# Patient Record
Sex: Female | Born: 1990 | Race: Black or African American | Hispanic: No | Marital: Married | Smoking: Never smoker
Health system: Southern US, Community
[De-identification: ages and names within clinical notes are randomized; demographics above are authoritative.]

## PROBLEM LIST (undated history)

## (undated) ENCOUNTER — Inpatient Hospital Stay (HOSPITAL_COMMUNITY): Payer: Self-pay

## (undated) DIAGNOSIS — R519 Headache, unspecified: Secondary | ICD-10-CM

## (undated) DIAGNOSIS — R87629 Unspecified abnormal cytological findings in specimens from vagina: Secondary | ICD-10-CM

## (undated) DIAGNOSIS — O139 Gestational [pregnancy-induced] hypertension without significant proteinuria, unspecified trimester: Secondary | ICD-10-CM

## (undated) HISTORY — PX: BLADDER SURGERY: SHX569

---

## 2015-11-19 ENCOUNTER — Emergency Department (HOSPITAL_COMMUNITY)
Admission: EM | Admit: 2015-11-19 | Discharge: 2015-11-19 | Disposition: A | Discharge status: Home / Self Care | Payer: Self-pay | Attending: Emergency Medicine | Admitting: Emergency Medicine

## 2015-11-19 ENCOUNTER — Encounter (HOSPITAL_COMMUNITY): Payer: Self-pay | Admitting: *Deleted

## 2015-11-19 DIAGNOSIS — K029 Dental caries, unspecified: Secondary | ICD-10-CM | POA: Insufficient documentation

## 2015-11-19 DIAGNOSIS — R51 Headache: Secondary | ICD-10-CM | POA: Insufficient documentation

## 2015-11-19 DIAGNOSIS — R509 Fever, unspecified: Secondary | ICD-10-CM | POA: Insufficient documentation

## 2015-11-19 DIAGNOSIS — K0889 Other specified disorders of teeth and supporting structures: Secondary | ICD-10-CM | POA: Insufficient documentation

## 2015-11-19 DIAGNOSIS — G8929 Other chronic pain: Secondary | ICD-10-CM | POA: Insufficient documentation

## 2015-11-19 MED ORDER — HYDROCODONE-ACETAMINOPHEN 5-325 MG PO TABS
2.0000 | ORAL_TABLET | Freq: Once | ORAL | Status: AC
  Administered 2015-11-19: 2 via ORAL
  Filled 2015-11-19: qty 2

## 2015-11-19 MED ORDER — ONDANSETRON HCL 4 MG PO TABS
4.0000 mg | ORAL_TABLET | Freq: Once | ORAL | Status: AC
  Administered 2015-11-19: 4 mg via ORAL
  Filled 2015-11-19: qty 1

## 2015-11-19 MED ORDER — DIPHENHYDRAMINE HCL 50 MG/ML IJ SOLN
25.0000 mg | Freq: Once | INTRAMUSCULAR | Status: AC
  Administered 2015-11-19: 25 mg via INTRAMUSCULAR
  Filled 2015-11-19: qty 1

## 2015-11-19 MED ORDER — PENICILLIN V POTASSIUM 500 MG PO TABS: 500.0000 mg | ORAL_TABLET | Freq: Four times a day (QID) | ORAL | Status: AC

## 2015-11-19 MED ORDER — HYDROCODONE-ACETAMINOPHEN 5-325 MG PO TABS: 1.0000 | ORAL_TABLET | Freq: Four times a day (QID) | ORAL | Status: DC | PRN

## 2015-11-19 NOTE — ED Notes (Signed)
Med given 

## 2015-11-19 NOTE — ED Provider Notes (Signed)
History  By signing my name below, I, Karle Plumber, attest that this documentation has been prepared under the direction and in the presence of Lane Hacker, PA-C. Electronically Signed: Karle Plumber, ED Scribe. 11/19/2015. 7:41 PM.  Chief Complaint  Patient presents with  . Dental Problem   The history is provided by the patient and medical records. No language interpreter was used.    HPI Comments:  Heather Riggs is a 25 y.o. female who presents to the Emergency Department complaining of moderate lower left dental pain that began about three days ago. She reports associated fever and chills. She has not taken anything for pain. She denies modifying factors. She denies drainage or facial swelling. Pt states she does have a dentist. She also reports an intermittent chronic HA for the past 6 months. She states when the HA comes it lasts about one week. She reports lying down helps to alleviate the HA but she has not been able to do that today. She denies worsening factors. She denies visual changes.   History reviewed. No pertinent past medical history. History reviewed. No pertinent past surgical history. No family history on file. Social History  Substance Use Topics  . Smoking status: Never Smoker   . Smokeless tobacco: None  . Alcohol Use: No   OB History    No data available     Review of Systems A complete 10 system review of systems was obtained and all systems are negative except as noted in the HPI and PMH.   Allergies  Review of patient's allergies indicates no known allergies.  Home Medications   Prior to Admission medications   Not on File   Triage Vitals: BP 145/116 mmHg  Pulse 74  Temp(Src) 98.8 F (37.1 C)  Resp 18  Ht  (1.854 m)  Wt 220 lb 2 oz (99.848 kg)  BMI 29.05 kg/m2  SpO2 99%  LMP 11/09/2015 Physical Exam  Constitutional: She is oriented to person, place, and time. She appears well-developed and well-nourished.  HENT:  Head:  Normocephalic and atraumatic.  Fractured left bottom molar with dental carry present. No surrounding erythema or edema. Airway intact. Not concerned for Ludwig's angina.  Eyes: EOM are normal.  Neck: Normal range of motion.  Cardiovascular: Normal rate.   Pulmonary/Chest: Effort normal.  Musculoskeletal: Normal range of motion.  Neurological: She is alert and oriented to person, place, and time. No cranial nerve deficit.  Normal neuro exam: RAM, pronator drift, finger to nose, gait normal.  Skin: Skin is warm and dry.  Psychiatric: She has a normal mood and affect. Her behavior is normal.  Nursing note and vitals reviewed.   ED Course  Procedures  DIAGNOSTIC STUDIES: Oxygen Saturation is 99% on RA, normal by my interpretation.   COORDINATION OF CARE: 7:40 PM- Will prescribe antibiotic and pain medication. Encouraged pt to follow up with dentist. Will give medication for HA and re-evaluate. Pt verbalizes understanding and agrees to plan.  Medications  HYDROcodone-acetaminophen (NORCO/VICODIN) 5-325 MG per tablet 2 tablet (not administered)  diphenhydrAMINE (BENADRYL) injection 25 mg (not administered)    MDM   Final diagnoses:  Pain, dental   Patient non-toxic appearing and VSS. Patient with toothache.  No gross abscess.  Exam unconcerning for Ludwig's angina or spread of infection.  Will treat with penicillin and pain medicine.  Urged patient to follow-up with dentist.  Headache chronic in nature, advised neurology follow-up. Most likely primary HA . Less likely hypertensive HA, venous sinus thrombosis, aneurysmal  SAH, AVM, acute CVA, preeclampsia, carotid dissection, ICH, SAH, SDH, EDH, postconcussive syndrome, meningitis, encephalitis, abscess, mass effect, hydrocephalus, pseudotumor cerebri, glaucoma, trigeminal neuralgia, sinusitis, TMJ syndrome, temporal arteritis, mastoiditis, carbon monoxide poisoning, rebound HA based on patient history and physical exam.   Medications   HYDROcodone-acetaminophen (NORCO/VICODIN) 5-325 MG per tablet 2 tablet (2 tablets Oral Given 11/19/15 2001)  diphenhydrAMINE (BENADRYL) injection 25 mg (25 mg Intramuscular Given 11/19/15 2002)  ondansetron (ZOFRAN) tablet 4 mg (4 mg Oral Given 11/19/15 2107)   Patient feels improved after observation and/or treatment in ED.  Patient may be safely discharged home with  Discharge Medication List as of 11/19/2015  8:39 PM    START taking these medications   Details  HYDROcodone-acetaminophen (NORCO/VICODIN) 5-325 MG tablet Take 1-2 tablets by mouth every 6 (six) hours as needed., Starting 11/19/2015, Until Discontinued, Print    penicillin v potassium (VEETID) 500 MG tablet Take 1 tablet (500 mg total) by mouth 4 (four) times daily., Starting 11/19/2015, Until Sat 11/26/15, Print       Discussed reasons for return. Patient to follow-up with dentist and neurology. Patient in understanding and agreement with the plan.  I personally performed the services described in this documentation, which was scribed in my presence. The recorded information has been reviewed and is accurate.   Melton Krebs, PA-C 11/21/15 0225  Vanetta Mulders, MD 11/23/15 [phone]

## 2015-11-19 NOTE — ED Notes (Signed)
The pt has had a toothache for 3 days  lmp  3 weeks

## 2015-11-19 NOTE — ED Notes (Signed)
Pt has a dental appt this coming week but reports dental pain to be 10/10 . Pt also reports having a HA.

## 2015-11-19 NOTE — Discharge Instructions (Signed)
[name],  Nice meeting you! Please follow-up with neurology for your headache and your dentist. Return to the emergency department if you develop fevers, chills, yellow/green drainage, increased headache. Feel better soon!  S. Lane Hacker, PA-C

## 2016-04-29 ENCOUNTER — Encounter (HOSPITAL_COMMUNITY): Payer: Self-pay

## 2016-04-29 ENCOUNTER — Emergency Department (HOSPITAL_COMMUNITY)
Admission: EM | Admit: 2016-04-29 | Discharge: 2016-04-29 | Disposition: A | Discharge status: Home / Self Care | Payer: Self-pay | Attending: Emergency Medicine | Admitting: Emergency Medicine

## 2016-04-29 DIAGNOSIS — N390 Urinary tract infection, site not specified: Secondary | ICD-10-CM

## 2016-04-29 DIAGNOSIS — R1030 Lower abdominal pain, unspecified: Secondary | ICD-10-CM

## 2016-04-29 LAB — CBC
HCT: 39 % (ref 36.0–46.0)
Hemoglobin: 12.8 g/dL (ref 12.0–15.0)
MCH: 26.3 pg (ref 26.0–34.0)
MCHC: 32.8 g/dL (ref 30.0–36.0)
MCV: 80.2 fL (ref 78.0–100.0)
Platelets: 329 10*3/uL (ref 150–400)
RBC: 4.86 MIL/uL (ref 3.87–5.11)
RDW: 14.5 % (ref 11.5–15.5)
WBC: 7.9 10*3/uL (ref 4.0–10.5)

## 2016-04-29 LAB — COMPREHENSIVE METABOLIC PANEL
ALT: 13 U/L — ABNORMAL LOW (ref 14–54)
AST: 19 U/L (ref 15–41)
Albumin: 4 g/dL (ref 3.5–5.0)
Alkaline Phosphatase: 50 U/L (ref 38–126)
Anion gap: 6 (ref 5–15)
BUN: 10 mg/dL (ref 6–20)
CO2: 24 mmol/L (ref 22–32)
Calcium: 9.5 mg/dL (ref 8.9–10.3)
Chloride: 110 mmol/L (ref 101–111)
Creatinine, Ser: 0.79 mg/dL (ref 0.44–1.00)
GFR calc Af Amer: >60 mL/min (ref >60)
GFR calc non Af Amer: >60 mL/min (ref >60)
Glucose, Bld: 93 mg/dL (ref 65–99)
Potassium: 4.1 mmol/L (ref 3.5–5.1)
Sodium: 140 mmol/L (ref 135–145)
Total Bilirubin: 0.9 mg/dL (ref 0.3–1.2)
Total Protein: 7.4 g/dL (ref 6.5–8.1)

## 2016-04-29 LAB — URINALYSIS, ROUTINE W REFLEX MICROSCOPIC
Bilirubin Urine: NEGATIVE
Glucose, UA: NEGATIVE mg/dL
Hgb urine dipstick: NEGATIVE
Ketones, ur: NEGATIVE mg/dL
Nitrite: POSITIVE — AB
Protein, ur: NEGATIVE mg/dL
Specific Gravity, Urine: 1.02 (ref 1.005–1.030)
pH: 6.5 (ref 5.0–8.0)

## 2016-04-29 LAB — URINE MICROSCOPIC-ADD ON

## 2016-04-29 LAB — LIPASE, BLOOD: Lipase: 21 U/L (ref 11–51)

## 2016-04-29 LAB — I-STAT BETA HCG BLOOD, ED (MC, WL, AP ONLY): I-stat hCG, quantitative: <5 m[IU]/mL (ref <5)

## 2016-04-29 MED ORDER — DICYCLOMINE HCL 10 MG PO CAPS
10.0000 mg | ORAL_CAPSULE | Freq: Once | ORAL | Status: AC
  Administered 2016-04-29: 10 mg via ORAL
  Filled 2016-04-29: qty 1

## 2016-04-29 MED ORDER — NITROFURANTOIN MONOHYD MACRO 100 MG PO CAPS: 100.0000 mg | ORAL_CAPSULE | Freq: Two times a day (BID) | ORAL | 0 refills | Status: AC

## 2016-04-29 NOTE — Discharge Instructions (Signed)

## 2016-04-29 NOTE — ED Triage Notes (Signed)
Patient here with 3 weeks of lower abdominal pain . States she has some dysuria with same. No vomiting, no diarhea

## 2016-04-29 NOTE — ED Provider Notes (Signed)
Emergency Department Provider Note  Time seen: Approximately 10:40 AM  I have reviewed the triage vital signs and the nursing notes.   HISTORY  Chief Complaint Abdominal Pain   HPI Heather Riggs is a 25 y.o. female with PMH of HA presents to the emergency department with several days of intermittent, severe lower abdominal pain. Patient states that she has some baseline discomfort in a bandlike distribution across her lower abdomen. She reports somewhat worsening pain on the right. No associated vaginal bleeding or discharge. Her last period was 3 weeks ago and normal. No symptoms during that time. She reports when episodes of pain she is doubled over and crying in pain. Pain tends to then return near her baseline level of pain. No known history of ovarian cysts or masses. She denies any associated fever, vomiting, diarrhea. No chest pain or difficulty breathing.  History reviewed. No pertinent past medical history.  There are no active problems to display for this patient.   History reviewed. No pertinent surgical history.  Current Outpatient Rx  . Order #: [id number] Class: Historical Med  . Order #: [id number] Class: Print    Allergies Penicillins  No family history on file.  Social History Social History  Substance Use Topics  . Smoking status: Never Smoker  . Smokeless tobacco: Never Used  . Alcohol use No    Review of Systems  Constitutional: No fever/chills Eyes: No visual changes. ENT: No sore throat. Cardiovascular: Denies chest pain. Respiratory: Denies shortness of breath. Gastrointestinal: Positive abdominal pain.  No nausea, no vomiting.  No diarrhea.  No constipation. Genitourinary: Negative for dysuria. Musculoskeletal: Negative for back pain. Skin: Negative for rash. Neurological: Negative for headaches, focal weakness or numbness.  10-point ROS otherwise negative.  ____________________________________________   PHYSICAL EXAM:  VITAL  SIGNS: ED Triage Vitals  Enc Vitals Group     BP 04/29/16 1000 124/83     Pulse Rate 04/29/16 1000 73     Resp 04/29/16 1000 18     Temp 04/29/16 1000 98.7 F (37.1 C)     Temp Source 04/29/16 1000 Oral     SpO2 04/29/16 1000 100 %     Weight 04/29/16 1000 205 lb (93 kg)     Height 04/29/16 1000 6\' 1"  (1.854 m)     Pain Score 04/29/16 0959 8   Constitutional: Alert and oriented. Well appearing and in no acute distress. Eyes: Conjunctivae are normal. PERRL. Head: Atraumatic. Nose: No congestion/rhinnorhea. Mouth/Throat: Mucous membranes are moist.  Oropharynx non-erythematous. Neck: No stridor.   Cardiovascular: Normal rate, regular rhythm. Good peripheral circulation. Grossly normal heart sounds.   Respiratory: Normal respiratory effort.  No retractions. Lungs CTAB. Gastrointestinal: Soft with mild LLQ tenderness to palpation. No RLQ tenderness. Negative Murphy's sign. No distention.  Musculoskeletal: No lower extremity tenderness nor edema. No gross deformities of extremities. Neurologic:  Normal speech and language. No gross focal neurologic deficits are appreciated.  Skin:  Skin is warm, dry and intact. No rash noted. Psychiatric: Mood and affect are normal. Speech and behavior are normal.  ____________________________________________   LABS (all labs ordered are listed, but only abnormal results are displayed)  Labs Reviewed  COMPREHENSIVE METABOLIC PANEL - Abnormal; Notable for the following:       Result Value   ALT 13 (*)    All other components within normal limits  URINALYSIS, ROUTINE W REFLEX MICROSCOPIC (NOT AT [facility]) - Abnormal; Notable for the following:    Color, Urine AMBER (*)  APPearance CLOUDY (*)    Nitrite POSITIVE (*)    Leukocytes, UA LARGE (*)    All other components within normal limits  URINE MICROSCOPIC-ADD ON - Abnormal; Notable for the following:    Squamous Epithelial / LPF 0-5 (*)    Bacteria, UA MANY (*)    All other components within  normal limits  LIPASE, BLOOD  CBC  I-STAT BETA HCG BLOOD, ED ([facility], [facility], [facility] ONLY)   ___________________________________________   PROCEDURES  Procedure(s) performed:   Procedures  None ____________________________________________   INITIAL IMPRESSION / ASSESSMENT AND PLAN / ED COURSE  Pertinent labs & imaging results that were available during my care of the patient were reviewed by me and considered in my medical decision making (see chart for details).  Patient resents to the emergency department for evaluation of intermittent, severe lower abdominal discomfort. Exam is largely reassuring with some mild left lower quadrant tenderness to palpation. No rebound or guarding. No vaginal discharge, dysuria, other GU symptoms. Patient's pregnancy test is negative. The emergency department. Low suspicion on exam for acute appendicitis, cholecystitis, or pancreatitis. Given the patient's intermittent, severe lower abdominal symptoms, I have considered ovarian torsion but feel this is far less likely.   11:17 AM No additional pain in the emergency department. The patient has evidence of a urinary tract infection. No clinical concern for pyelonephritis. Plan to treat with Macrobid. Specifically discussed return precautions with the patient regarding development of pyelonephritis. She is pleased at discharge.    ____________________________________________  FINAL CLINICAL IMPRESSION(S) / ED DIAGNOSES  Final diagnoses:  UTI (lower urinary tract infection)  Lower abdominal pain     MEDICATIONS GIVEN DURING THIS VISIT:  Medications  dicyclomine (BENTYL) capsule 10 mg (10 mg Oral Given 04/29/16 1036)     NEW OUTPATIENT MEDICATIONS STARTED DURING THIS VISIT:  Discharge Medication List as of 04/29/2016 11:22 AM    START taking these medications   Details  nitrofurantoin, macrocrystal-monohydrate, (MACROBID) 100 MG capsule Take 1 capsule (100 mg total) by mouth 2 (two) times daily.,  Starting Sun 04/29/2016, Until Fri 05/04/2016, Print          Note:  This document was prepared using Dragon voice recognition software and may include unintentional dictation errors.  Alona Bene, MD Emergency Medicine   Maia Plan, MD 04/29/16 [phone]

## 2016-06-02 ENCOUNTER — Inpatient Hospital Stay (HOSPITAL_COMMUNITY): Payer: Medicaid Other

## 2016-06-02 ENCOUNTER — Inpatient Hospital Stay (HOSPITAL_COMMUNITY)
Admission: AD | Admit: 2016-06-02 | Discharge: 2016-06-02 | Disposition: A | Discharge status: Home / Self Care | Payer: Medicaid Other | Source: Ambulatory Visit | Attending: Obstetrics & Gynecology | Admitting: Obstetrics & Gynecology

## 2016-06-02 ENCOUNTER — Encounter (HOSPITAL_COMMUNITY): Payer: Self-pay

## 2016-06-02 DIAGNOSIS — Z880 Allergy status to penicillin: Secondary | ICD-10-CM | POA: Diagnosis not present

## 2016-06-02 DIAGNOSIS — O3680X0 Pregnancy with inconclusive fetal viability, not applicable or unspecified: Secondary | ICD-10-CM | POA: Insufficient documentation

## 2016-06-02 DIAGNOSIS — R109 Unspecified abdominal pain: Secondary | ICD-10-CM | POA: Insufficient documentation

## 2016-06-02 DIAGNOSIS — Z3A01 Less than 8 weeks gestation of pregnancy: Secondary | ICD-10-CM | POA: Diagnosis not present

## 2016-06-02 DIAGNOSIS — Z3201 Encounter for pregnancy test, result positive: Secondary | ICD-10-CM | POA: Insufficient documentation

## 2016-06-02 DIAGNOSIS — O9989 Other specified diseases and conditions complicating pregnancy, childbirth and the puerperium: Secondary | ICD-10-CM | POA: Diagnosis not present

## 2016-06-02 DIAGNOSIS — O26891 Other specified pregnancy related conditions, first trimester: Secondary | ICD-10-CM | POA: Diagnosis present

## 2016-06-02 DIAGNOSIS — O26899 Other specified pregnancy related conditions, unspecified trimester: Secondary | ICD-10-CM

## 2016-06-02 LAB — CBC
HCT: 35.1 % — ABNORMAL LOW (ref 36.0–46.0)
Hemoglobin: 12 g/dL (ref 12.0–15.0)
MCH: 26.2 pg (ref 26.0–34.0)
MCHC: 34.2 g/dL (ref 30.0–36.0)
MCV: 76.6 fL — ABNORMAL LOW (ref 78.0–100.0)
Platelets: 302 10*3/uL (ref 150–400)
RBC: 4.58 MIL/uL (ref 3.87–5.11)
RDW: 15.2 % (ref 11.5–15.5)
WBC: 6.5 10*3/uL (ref 4.0–10.5)

## 2016-06-02 LAB — URINALYSIS, ROUTINE W REFLEX MICROSCOPIC
Bilirubin Urine: NEGATIVE
Glucose, UA: NEGATIVE mg/dL
Ketones, ur: NEGATIVE mg/dL
Nitrite: NEGATIVE
Protein, ur: NEGATIVE mg/dL
Specific Gravity, Urine: 1.015 (ref 1.005–1.030)
pH: 5.5 (ref 5.0–8.0)

## 2016-06-02 LAB — WET PREP, GENITAL
Sperm: NONE SEEN
Trich, Wet Prep: NONE SEEN
Yeast Wet Prep HPF POC: NONE SEEN

## 2016-06-02 LAB — URINE MICROSCOPIC-ADD ON

## 2016-06-02 LAB — HCG, QUANTITATIVE, PREGNANCY: hCG, Beta Chain, Quant, S: 1567 m[IU]/mL — ABNORMAL HIGH (ref <5)

## 2016-06-02 LAB — POCT PREGNANCY, URINE: Preg Test, Ur: POSITIVE — AB

## 2016-06-02 LAB — ABO/RH: ABO/RH(D): O POS

## 2016-06-02 NOTE — MAU Note (Signed)
Patient presents with intermittent stomach pain for one week, lower back pain, no dysuria, no constipation, no N/V/D, no vaginal discharge.

## 2016-06-02 NOTE — MAU Provider Note (Signed)
History     CSN: [id number]  Arrival date and time: 06/02/16 0917   First Provider Initiated Contact with Patient 06/02/16 [phone]      Chief Complaint  Patient presents with  . Abdominal Pain   HPI Heather Riggs 24 y.o. Last normal menses was the first of July.  Bleeding in August was not a normal period.  Has had sharp midline back pain radiating into shoulders for one week.  Worsening.  Did not know she was pregnant although was not using any contraception.  No vaginal bleeding.  No vaginal discharge.   OB History    Gravida Para Term Preterm AB Living   0 0 0 0 0 0   SAB TAB Ectopic Multiple Live Births   0 0 0 0 0      No past medical history on file.  Past Surgical History:  Procedure Laterality Date  . BLADDER SURGERY     25 years old    No family history on file.  Social History  Substance Use Topics  . Smoking status: Never Smoker  . Smokeless tobacco: Never Used  . Alcohol use No    Allergies:  Allergies  Allergen Reactions  . Penicillins Swelling and Other (See Comments)    Reaction:  Facial swelling  Has patient had a PCN reaction causing immediate rash, facial/tongue/throat swelling, SOB or lightheadedness with hypotension: Yes Has patient had a PCN reaction causing severe rash involving mucus membranes or skin necrosis: No Has patient had a PCN reaction that required hospitalization No Has patient had a PCN reaction occurring within the last 10 years: Yes If all of the above answers are "NO", then may proceed with Cephalosporin use.    Prescriptions Prior to Admission  Medication Sig Dispense Refill Last Dose  . acetaminophen (TYLENOL) 325 MG tablet Take 650 mg by mouth every 6 (six) hours as needed for mild pain, moderate pain or headache.    Past Month at Unknown time    Review of Systems  Constitutional: Negative for fever.  Gastrointestinal: Negative for abdominal pain, nausea and vomiting.  Genitourinary:       No vaginal  discharge. No vaginal bleeding. No dysuria.  Musculoskeletal: Positive for back pain.   Physical Exam   Blood pressure 119/63, pulse 72, temperature 98.2 F (36.8 C), temperature source Oral, resp. rate 18, height 6' (1.829 m), weight 211 lb 0.3 oz (95.7 kg), last menstrual period 05/09/2016.  Physical Exam  Nursing note and vitals reviewed. Constitutional: She is oriented to person, place, and time. She appears well-developed and well-nourished. No distress.  HENT:  Head: Normocephalic.  Eyes: EOM are normal.  Neck: Neck supple.  GI: Soft. There is no tenderness. There is no rebound and no guarding.  Genitourinary:  Genitourinary Comments: Speculum exam: Vagina - Small amount of pale yellow liquid discharge, no odor Cervix - slightly friable Bimanual exam: Cervix closed Uterus non tender, slightly enlarged Adnexa non tender, no masses bilaterally GC/Chlam, wet prep done Chaperone present for exam.   Musculoskeletal: Normal range of motion.  Pain is in midline at waist level and radiating upward.  Neurological: She is alert and oriented to person, place, and time.  Skin: Skin is warm and dry.  Psychiatric: She has a normal mood and affect.    MAU Course  Procedures Results for orders placed or performed during the hospital encounter of 06/02/16 (from the past 24 hour(s))  Urinalysis, Routine w reflex microscopic (not at [facility])  Status: Abnormal   Collection Time: 06/02/16  9:30 AM  Result Value Ref Range   Color, Urine YELLOW YELLOW   APPearance CLEAR CLEAR   Specific Gravity, Urine 1.015 1.005 - 1.030   pH 5.5 5.0 - 8.0   Glucose, UA NEGATIVE NEGATIVE mg/dL   Hgb urine dipstick LARGE (A) NEGATIVE   Bilirubin Urine NEGATIVE NEGATIVE   Ketones, ur NEGATIVE NEGATIVE mg/dL   Protein, ur NEGATIVE NEGATIVE mg/dL   Nitrite NEGATIVE NEGATIVE   Leukocytes, UA LARGE (A) NEGATIVE  Urine microscopic-add on     Status: Abnormal   Collection Time: 06/02/16  9:30 AM  Result  Value Ref Range   Squamous Epithelial / LPF 6-30 (A) NONE SEEN   WBC, UA 6-30 0 - 5 WBC/hpf   RBC / HPF 0-5 0 - 5 RBC/hpf   Bacteria, UA MANY (A) NONE SEEN  Pregnancy, urine POC     Status: Abnormal   Collection Time: 06/02/16  9:44 AM  Result Value Ref Range   Preg Test, Ur POSITIVE (A) NEGATIVE  CBC     Status: Abnormal   Collection Time: 06/02/16  9:58 AM  Result Value Ref Range   WBC 6.5 4.0 - 10.5 K/uL   RBC 4.58 3.87 - 5.11 MIL/uL   Hemoglobin 12.0 12.0 - 15.0 g/dL   HCT 16.135.1 (L) 09.636.0 - 04.546.0 %   MCV 76.6 (L) 78.0 - 100.0 fL   MCH 26.2 26.0 - 34.0 pg   MCHC 34.2 30.0 - 36.0 g/dL   RDW 40.915.2 81.111.5 - 91.415.5 %   Platelets 302 150 - 400 K/uL  hCG, quantitative, pregnancy     Status: Abnormal   Collection Time: 06/02/16  9:58 AM  Result Value Ref Range   hCG, Beta Chain, Quant, S 1,567 (H) <5 mIU/mL  ABO/Rh     Status: None (Preliminary result)   Collection Time: 06/02/16  9:58 AM  Result Value Ref Range   ABO/RH(D) O POS   Wet prep, genital     Status: Abnormal   Collection Time: 06/02/16 10:15 AM  Result Value Ref Range   Yeast Wet Prep HPF POC NONE SEEN NONE SEEN   Trich, Wet Prep NONE SEEN NONE SEEN   Clue Cells Wet Prep HPF POC PRESENT (A) NONE SEEN   WBC, Wet Prep HPF POC MODERATE BACTERIA SEEN (A) NONE SEEN   Sperm NONE SEEN     MDM Preliminary Ultrasound report reviewed.  IUGS at 2258w0d, no yolk sac, no embryo, normal ovaries, no other abnormality. Discussed ultrasound with client reviewing the pregnancy is early and cannot be confirmed by yolk sac to be in the uterus.  Possible that this could be an ectopic pregnancy and will need further evaluation to confirm an IUP. Reviewed info with Dr. Debroah LoopArnold.  Will plan to come back in 48 hours to repeat quant.  Assessment and Plan  Pregnancy of unknown anatomical location  Plan Repeat quant in 48 hours.  Come to clinic on ground floor at 11 am. Pelvic rest - nothing in the vagina - no sex, no tampons, no douching.  No heavy  lifting. Labs pending. Return sooner if you have severe pain or severe bleeding.  [name] 06/02/2016, 10:10 AM

## 2016-06-02 NOTE — Discharge Instructions (Signed)
Pelvic rest - nothing in the vagina - no sex, no tampons, no douching.  No heavy lifting. Labs pending. Return sooner if you have severe pain or severe bleeding. Return for repeat lab work on Monday.  Come to the clinic on the ground floor at 11 am.

## 2016-06-03 LAB — RPR: RPR Ser Ql: NONREACTIVE

## 2016-06-03 LAB — HIV ANTIBODY (ROUTINE TESTING W REFLEX): HIV Screen 4th Generation wRfx: NONREACTIVE

## 2016-06-04 ENCOUNTER — Other Ambulatory Visit: Payer: Self-pay

## 2016-06-04 DIAGNOSIS — O3680X0 Pregnancy with inconclusive fetal viability, not applicable or unspecified: Secondary | ICD-10-CM

## 2016-06-04 LAB — GC/CHLAMYDIA PROBE AMP (~~LOC~~) NOT AT ARMC
Chlamydia: NEGATIVE
Neisseria Gonorrhea: NEGATIVE

## 2016-06-04 LAB — HCG, QUANTITATIVE, PREGNANCY: hCG, Beta Chain, Quant, S: 3642 m[IU]/mL — ABNORMAL HIGH (ref <5)

## 2016-06-04 NOTE — Progress Notes (Addendum)
Patient presented to office today for a repeat hcg. Patient stated she has some mild cramping at this time. Patient has been instructed to wait until results so that provider can treat. Patient result are increasing. Patient should repeat beta in 7 days and depending on the results patient can start prenatal care at her choice of OB  providers. Patient verbalizes understanding and will follow up at next Monday 06/11/2016 for a repeat beta.

## 2016-06-12 ENCOUNTER — Other Ambulatory Visit: Payer: Self-pay | Admitting: General Practice

## 2016-06-12 DIAGNOSIS — O3680X0 Pregnancy with inconclusive fetal viability, not applicable or unspecified: Secondary | ICD-10-CM

## 2016-06-12 LAB — HCG, QUANTITATIVE, PREGNANCY: hCG, Beta Chain, Quant, S: 20649 m[IU]/mL — ABNORMAL HIGH (ref <5)

## 2016-06-12 NOTE — Progress Notes (Signed)
Patient here for stat bhcg today. Patient denies bleeding or pain. Reviewed results with Donette LarryMelanie Bhambri who agrees to favorable rise in bhcg, patient needs repeat ultrasound. Scheduled for 9/13 @ 1pm with office appt to follow for results. Informed patient of results & appts. Patient verbalized understanding to all & had no questions

## 2016-06-20 ENCOUNTER — Ambulatory Visit (HOSPITAL_COMMUNITY)
Admission: RE | Admit: 2016-06-20 | Discharge: 2016-06-20 | Disposition: A | Discharge status: Home / Self Care | Payer: Medicaid Other | Source: Ambulatory Visit | Attending: Certified Nurse Midwife | Admitting: Certified Nurse Midwife

## 2016-06-20 DIAGNOSIS — Z36 Encounter for antenatal screening of mother: Secondary | ICD-10-CM | POA: Diagnosis not present

## 2016-06-20 DIAGNOSIS — Z3A01 Less than 8 weeks gestation of pregnancy: Secondary | ICD-10-CM | POA: Insufficient documentation

## 2016-06-20 DIAGNOSIS — O3680X0 Pregnancy with inconclusive fetal viability, not applicable or unspecified: Secondary | ICD-10-CM

## 2016-06-30 ENCOUNTER — Encounter (HOSPITAL_COMMUNITY): Payer: Self-pay | Admitting: *Deleted

## 2016-06-30 ENCOUNTER — Emergency Department (HOSPITAL_COMMUNITY)
Admission: EM | Admit: 2016-06-30 | Discharge: 2016-06-30 | Disposition: A | Discharge status: Home / Self Care | Payer: Medicaid Other | Attending: Emergency Medicine | Admitting: Emergency Medicine

## 2016-06-30 DIAGNOSIS — Z3A08 8 weeks gestation of pregnancy: Secondary | ICD-10-CM | POA: Insufficient documentation

## 2016-06-30 DIAGNOSIS — O9989 Other specified diseases and conditions complicating pregnancy, childbirth and the puerperium: Secondary | ICD-10-CM | POA: Diagnosis not present

## 2016-06-30 DIAGNOSIS — R51 Headache: Secondary | ICD-10-CM | POA: Diagnosis not present

## 2016-06-30 DIAGNOSIS — O10011 Pre-existing essential hypertension complicating pregnancy, first trimester: Secondary | ICD-10-CM | POA: Insufficient documentation

## 2016-06-30 DIAGNOSIS — R519 Headache, unspecified: Secondary | ICD-10-CM

## 2016-06-30 MED ORDER — DIPHENHYDRAMINE HCL 50 MG/ML IJ SOLN
12.5000 mg | Freq: Once | INTRAMUSCULAR | Status: AC
  Administered 2016-06-30: 12.5 mg via INTRAMUSCULAR
  Filled 2016-06-30: qty 1

## 2016-06-30 MED ORDER — ONDANSETRON HCL 4 MG PO TABS: 4.0000 mg | ORAL_TABLET | Freq: Once | ORAL | Status: DC

## 2016-06-30 MED ORDER — METOCLOPRAMIDE HCL 5 MG/ML IJ SOLN
10.0000 mg | Freq: Once | INTRAMUSCULAR | Status: AC
  Administered 2016-06-30: 10 mg via INTRAMUSCULAR
  Filled 2016-06-30: qty 2

## 2016-06-30 MED ORDER — ONDANSETRON 4 MG PO TBDP
8.0000 mg | ORAL_TABLET | Freq: Once | ORAL | Status: AC
  Administered 2016-06-30: 8 mg via ORAL
  Filled 2016-06-30: qty 2

## 2016-06-30 NOTE — ED Triage Notes (Signed)
Headache for 2-3 days with nv  And she cannot get any relief  lmp  8 nweeks preg   Eck may 1st 2018  First baby

## 2016-06-30 NOTE — ED Provider Notes (Signed)
[facility]-EMERGENCY DEPT Provider Note   CSN: [id number] Arrival date & time: 06/30/16  4098     History   Chief Complaint Chief Complaint  Patient presents with  . Headache    HPI Heather Riggs is a 25 y.o. female.  25 year old female who is [redacted] weeks pregnant presents with left-sided headache similar to her prior migraines. Does note some flashes of light in her vision. Denies any visual loss. Pain described as throbbing has been present for 2 days. Denies any fever or neck pain. No photophobia or phonophobia. No weakness in upper or lower extremities. Has used Tylenol without relief. States this is not the worse headache that she has ever had. Denies any vision loss. Does not take any chronic take medication currently.      Past Medical History:  Diagnosis Date  . Hypertension     There are no active problems to display for this patient.   Past Surgical History:  Procedure Laterality Date  . BLADDER SURGERY     24 years old    OB History    Gravida Para Term Preterm AB Living   1 0 0 0 0 0   SAB TAB Ectopic Multiple Live Births   0 0 0 0 0       Home Medications    Prior to Admission medications   Medication Sig Start Date End Date Taking? Authorizing Provider  acetaminophen (TYLENOL) 325 MG tablet Take 650 mg by mouth every 6 (six) hours as needed for mild pain, moderate pain or headache.     Historical Provider, MD    Family History No family history on file.  Social History Social History  Substance Use Topics  . Smoking status: Never Smoker  . Smokeless tobacco: Never Used  . Alcohol use No     Allergies   Penicillins   Review of Systems Review of Systems  All other systems reviewed and are negative.    Physical Exam Updated Vital Signs BP 105/58   Pulse 82   Temp 98.8 F (37.1 C)   Resp 18   Ht 6' (1.829 m)   Wt 97.7 kg   LMP 05/09/2016 Comment: only 2 days long  SpO2 99%   BMI 29.22 kg/m   Physical Exam  Constitutional:  She is oriented to person, place, and time. She appears well-developed and well-nourished.  Non-toxic appearance. No distress.  HENT:  Head: Normocephalic and atraumatic.  Eyes: Conjunctivae, EOM and lids are normal. Pupils are equal, round, and reactive to light.  Neck: Normal range of motion. Neck supple. No tracheal deviation present. No thyroid mass present.  Cardiovascular: Normal rate, regular rhythm and normal heart sounds.  Exam reveals no gallop.   No murmur heard. Pulmonary/Chest: Effort normal and breath sounds normal. No stridor. No respiratory distress. She has no decreased breath sounds. She has no wheezes. She has no rhonchi. She has no rales.  Abdominal: Soft. Normal appearance and bowel sounds are normal. She exhibits no distension. There is no tenderness. There is no rebound and no CVA tenderness.  Musculoskeletal: Normal range of motion. She exhibits no edema or tenderness.  Neurological: She is alert and oriented to person, place, and time. She has normal strength. No cranial nerve deficit or sensory deficit. GCS eye subscore is 4. GCS verbal subscore is 5. GCS motor subscore is 6.  Skin: Skin is warm and dry. No abrasion and no rash noted.  Psychiatric: She has a normal mood and affect. Her speech is  normal and behavior is normal.  Nursing note and vitals reviewed.    ED Treatments / Results  Labs (all labs ordered are listed, but only abnormal results are displayed) Labs Reviewed - No data to display  EKG  EKG Interpretation None       Radiology No results found.  Procedures Procedures (including critical care time)  Medications Ordered in ED Medications  metoCLOPramide (REGLAN) injection 10 mg (not administered)  diphenhydrAMINE (BENADRYL) injection 12.5 mg (not administered)  ondansetron (ZOFRAN-ODT) disintegrating tablet 8 mg (8 mg Oral Given 06/30/16 1852)     Initial Impression / Assessment and Plan / ED Course  I have reviewed the triage vital  signs and the nursing notes.  Pertinent labs & imaging results that were available during my care of the patient were reviewed by me and considered in my medical decision making (see chart for details).  Clinical Course    Patient medicated here with Reglan and Benadryl and feels better. She was given ginger ale unable to keep it down. Symptoms likely from a migraine. She has history of same. Return precautions given  Final Clinical Impressions(s) / ED Diagnoses   Final diagnoses:  None    New Prescriptions New Prescriptions   No medications on file     Lorre NickAnthony Seraphine Gudiel, MD 06/30/16 2223

## 2016-07-30 ENCOUNTER — Other Ambulatory Visit (HOSPITAL_COMMUNITY): Payer: Self-pay | Admitting: Nurse Practitioner

## 2016-07-30 DIAGNOSIS — Z369 Encounter for antenatal screening, unspecified: Secondary | ICD-10-CM

## 2016-07-30 LAB — OB RESULTS CONSOLE HEPATITIS B SURFACE ANTIGEN: Hepatitis B Surface Ag: NEGATIVE

## 2016-07-30 LAB — OB RESULTS CONSOLE RUBELLA ANTIBODY, IGM: Rubella: IMMUNE

## 2016-07-30 LAB — OB RESULTS CONSOLE GBS: GBS: NEGATIVE

## 2016-08-15 ENCOUNTER — Encounter (HOSPITAL_COMMUNITY): Payer: Self-pay | Admitting: *Deleted

## 2016-08-15 ENCOUNTER — Inpatient Hospital Stay (HOSPITAL_COMMUNITY)
Admission: AD | Admit: 2016-08-15 | Discharge: 2016-08-15 | Disposition: A | Discharge status: Home / Self Care | Payer: Medicaid Other | Source: Ambulatory Visit | Attending: Family Medicine | Admitting: Family Medicine

## 2016-08-15 DIAGNOSIS — Z3A15 15 weeks gestation of pregnancy: Secondary | ICD-10-CM | POA: Diagnosis not present

## 2016-08-15 DIAGNOSIS — R1013 Epigastric pain: Secondary | ICD-10-CM | POA: Diagnosis present

## 2016-08-15 DIAGNOSIS — K219 Gastro-esophageal reflux disease without esophagitis: Secondary | ICD-10-CM | POA: Diagnosis not present

## 2016-08-15 DIAGNOSIS — Z3A14 14 weeks gestation of pregnancy: Secondary | ICD-10-CM

## 2016-08-15 DIAGNOSIS — Z880 Allergy status to penicillin: Secondary | ICD-10-CM | POA: Insufficient documentation

## 2016-08-15 DIAGNOSIS — O99612 Diseases of the digestive system complicating pregnancy, second trimester: Secondary | ICD-10-CM | POA: Diagnosis not present

## 2016-08-15 LAB — URINALYSIS, ROUTINE W REFLEX MICROSCOPIC
Bilirubin Urine: NEGATIVE
Glucose, UA: NEGATIVE mg/dL
Ketones, ur: NEGATIVE mg/dL
Nitrite: NEGATIVE
Protein, ur: NEGATIVE mg/dL
Specific Gravity, Urine: <1.005 — ABNORMAL LOW (ref 1.005–1.030)
pH: 7 (ref 5.0–8.0)

## 2016-08-15 LAB — URINE MICROSCOPIC-ADD ON: RBC / HPF: NONE SEEN RBC/hpf (ref 0–5)

## 2016-08-15 MED ORDER — FAMOTIDINE 40 MG PO TABS: 40.0000 mg | ORAL_TABLET | Freq: Every day | ORAL | 1 refills | Status: DC

## 2016-08-15 MED ORDER — GI COCKTAIL ~~LOC~~
30.0000 mL | Freq: Once | ORAL | Status: AC
  Administered 2016-08-15: 30 mL via ORAL
  Filled 2016-08-15: qty 30

## 2016-08-15 NOTE — MAU Provider Note (Signed)
History     CSN: [id number]  Arrival date and time: 08/15/16 [id number]   First Provider Initiated Contact with Patient 08/15/16 [phone]      Chief Complaint  Patient presents with  . Abdominal Pain   Heather Riggs is 25 y.o. G1P0000 at 4865w1d who presents today with epigastric pain. She denies any lower abdominal pain, VB or LOF.    Abdominal Pain  This is a new problem. The current episode started today. The onset quality is gradual. The problem occurs constantly. The problem has been gradually improving. The pain is located in the epigastric region. The pain is at a severity of 7/10. The quality of the pain is sharp. The abdominal pain does not radiate. Pertinent negatives include no constipation, diarrhea, dysuria, fever, frequency, nausea or vomiting. Nothing aggravates the pain. The pain is relieved by nothing. She has tried nothing for the symptoms.   Past Medical History:  Diagnosis Date  . Hypertension     Past Surgical History:  Procedure Laterality Date  . BLADDER SURGERY     25 years old    History reviewed. No pertinent family history.  Social History  Substance Use Topics  . Smoking status: Never Smoker  . Smokeless tobacco: Never Used  . Alcohol use No    Allergies:  Allergies  Allergen Reactions  . Penicillins Swelling and Other (See Comments)    Reaction:  Facial swelling  Has patient had a PCN reaction causing immediate rash, facial/tongue/throat swelling, SOB or lightheadedness with hypotension: Yes Has patient had a PCN reaction causing severe rash involving mucus membranes or skin necrosis: No Has patient had a PCN reaction that required hospitalization No Has patient had a PCN reaction occurring within the last 10 years: Yes If all of the above answers are "NO", then may proceed with Cephalosporin use.    Prescriptions Prior to Admission  Medication Sig Dispense Refill Last Dose  . acetaminophen (TYLENOL) 325 MG tablet Take 650 mg by mouth every 6  (six) hours as needed for mild pain, moderate pain or headache.    Past Month at Unknown time  . Prenatal Vit-Fe Fumarate-FA (PRENATAL MULTIVITAMIN) TABS tablet Take 1 tablet by mouth daily at 12 noon.   08/14/2016 at Unknown time    Review of Systems  Constitutional: Negative for chills and fever.  Gastrointestinal: Positive for abdominal pain. Negative for constipation, diarrhea, nausea and vomiting.  Genitourinary: Negative for dysuria, frequency and urgency.   Physical Exam   Blood pressure 109/67, pulse 90, temperature 98.4 F (36.9 C), temperature source Oral, resp. rate 20, height 5' 11.5" (1.816 m), weight 213 lb 8 oz (96.8 kg), last menstrual period 05/09/2016.  Physical Exam  Nursing note and vitals reviewed. Constitutional: She is oriented to person, place, and time. She appears well-developed and well-nourished. No distress.  Cardiovascular: Normal rate.   Respiratory: Effort normal.  GI: Soft. There is no tenderness. There is no rebound.  Genitourinary:  Genitourinary Comments: FHT 155 with doppler   Neurological: She is alert and oriented to person, place, and time.  Skin: Skin is warm and dry.  Psychiatric: She has a normal mood and affect.    MAU Course  Procedures  MDM Patient has had GI cocktail.she reports feeling better.   Assessment and Plan   1. Gastroesophageal reflux disease, esophagitis presence not specified   2. [redacted] weeks gestation of pregnancy    DC home Comfort measures reviewed  2nd Trimester precautions  RX: pepcid 40mg  QD #30 with  1 RF  Return to MAU as needed FU with OB as planned  Follow-up Information    [facility].   Contact information: [address] [place] [address] [phone]            Tawnya CrookHogan, Rosine Solecki Donovan 08/15/2016, 6:21 AM

## 2016-08-15 NOTE — Discharge Instructions (Signed)
Food Choices for Gastroesophageal Reflux Disease, Adult When you have gastroesophageal reflux disease (GERD), the foods you eat and your eating habits are very important. Choosing the right foods can help ease the discomfort of GERD. WHAT GENERAL GUIDELINES DO I NEED TO FOLLOW?  Choose fruits, vegetables, whole grains, low-fat dairy products, and low-fat meat, fish, and poultry.  Limit fats such as oils, salad dressings, butter, nuts, and avocado.  Keep a food diary to identify foods that cause symptoms.  Avoid foods that cause reflux. These may be different for different people.  Eat frequent small meals instead of three large meals each day.  Eat your meals slowly, in a relaxed setting.  Limit fried foods.  Cook foods using methods other than frying.  Avoid drinking alcohol.  Avoid drinking large amounts of liquids with your meals.  Avoid bending over or lying down until 2-3 hours after eating. WHAT FOODS ARE NOT RECOMMENDED? The following are some foods and drinks that may worsen your symptoms: Vegetables Tomatoes. Tomato juice. Tomato and spaghetti sauce. Chili peppers. Onion and garlic. Horseradish. Fruits Oranges, grapefruit, and lemon (fruit and juice). Meats High-fat meats, fish, and poultry. This includes hot dogs, ribs, ham, sausage, salami, and bacon. Dairy Whole milk and chocolate milk. Sour cream. Cream. Butter. Ice cream. Cream cheese.  Beverages Coffee and tea, with or without caffeine. Carbonated beverages or energy drinks. Condiments Hot sauce. Barbecue sauce.  Sweets/Desserts Chocolate and cocoa. Donuts. Peppermint and spearmint. Fats and Oils High-fat foods, including French fries and potato chips. Other Vinegar. Strong spices, such as black pepper, white pepper, red pepper, cayenne, curry powder, cloves, ginger, and chili powder. The items listed above may not be a complete list of foods and beverages to avoid. Contact your dietitian for more  information.   This information is not intended to replace advice given to you by your health care provider. Make sure you discuss any questions you have with your health care provider.   Document Released: 09/24/2005 Document Revised: 10/15/2014 Document Reviewed: 07/29/2013 Elsevier Interactive Patient Education 2016 Elsevier Inc.  

## 2016-08-15 NOTE — MAU Note (Signed)
PT  SAYS  SHE HAS SHARP PAIN  IN UPPER  MIDDLE OF  ABD - STARTED AT 0500-   -  SAME  NOW  .    NO MEDS  FOR PAIN.    PNC  WITH   HD.     LAST SEX-    1 WEEK AGO. VOMITED ON Monday.

## 2016-10-08 NOTE — L&D Delivery Note (Signed)
Delivery Note At 3:34 PM a viable female was delivered via Vaginal, Spontaneous Delivery (Presentation: ROA).  APGAR: 9, 9; weight pending.   Placenta status: Delivered intact with gentle traction .  Cord:3 vessels with the following complications: None .  Cord pH: Not collected  Anesthesia: Epidural Episiotomy: None Lacerations: Labial Suture Repair: 3.0 vicryl Est. Blood Loss (mL): 200  Mom to postpartum.  Baby to Couplet care / Skin to Skin.  Lovena Neighbours, PGY-1 02/06/2017, 4:02 PM  OB FELLOW DELIVERY ATTESTATION  I was gloved and present for the delivery in its entirety, and I agree with the above resident's note.    Ernestina Penna, MD 5:23 PM

## 2016-10-30 ENCOUNTER — Encounter (HOSPITAL_COMMUNITY): Payer: Self-pay | Admitting: Emergency Medicine

## 2016-10-30 ENCOUNTER — Emergency Department (HOSPITAL_COMMUNITY)
Admission: EM | Admit: 2016-10-30 | Discharge: 2016-10-30 | Disposition: A | Discharge status: Home / Self Care | Payer: Medicaid Other | Attending: Emergency Medicine | Admitting: Emergency Medicine

## 2016-10-30 DIAGNOSIS — O26892 Other specified pregnancy related conditions, second trimester: Secondary | ICD-10-CM | POA: Diagnosis present

## 2016-10-30 DIAGNOSIS — Z79899 Other long term (current) drug therapy: Secondary | ICD-10-CM | POA: Diagnosis not present

## 2016-10-30 DIAGNOSIS — O26899 Other specified pregnancy related conditions, unspecified trimester: Secondary | ICD-10-CM

## 2016-10-30 DIAGNOSIS — Z3A26 26 weeks gestation of pregnancy: Secondary | ICD-10-CM | POA: Diagnosis not present

## 2016-10-30 DIAGNOSIS — Z3490 Encounter for supervision of normal pregnancy, unspecified, unspecified trimester: Secondary | ICD-10-CM

## 2016-10-30 DIAGNOSIS — O0281 Inappropriate change in quantitative human chorionic gonadotropin (hCG) in early pregnancy: Secondary | ICD-10-CM | POA: Insufficient documentation

## 2016-10-30 DIAGNOSIS — O10912 Unspecified pre-existing hypertension complicating pregnancy, second trimester: Secondary | ICD-10-CM | POA: Diagnosis not present

## 2016-10-30 DIAGNOSIS — R109 Unspecified abdominal pain: Secondary | ICD-10-CM

## 2016-10-30 LAB — COMPREHENSIVE METABOLIC PANEL
ALT: 33 U/L (ref 14–54)
AST: 33 U/L (ref 15–41)
Albumin: 3.3 g/dL — ABNORMAL LOW (ref 3.5–5.0)
Alkaline Phosphatase: 59 U/L (ref 38–126)
Anion gap: 9 (ref 5–15)
BUN: 5 mg/dL — ABNORMAL LOW (ref 6–20)
CO2: 21 mmol/L — ABNORMAL LOW (ref 22–32)
Calcium: 9.3 mg/dL (ref 8.9–10.3)
Chloride: 106 mmol/L (ref 101–111)
Creatinine, Ser: 0.65 mg/dL (ref 0.44–1.00)
GFR calc Af Amer: >60 mL/min (ref >60)
GFR calc non Af Amer: >60 mL/min (ref >60)
Glucose, Bld: 75 mg/dL (ref 65–99)
Potassium: 3.7 mmol/L (ref 3.5–5.1)
Sodium: 136 mmol/L (ref 135–145)
Total Bilirubin: 0.6 mg/dL (ref 0.3–1.2)
Total Protein: 6.7 g/dL (ref 6.5–8.1)

## 2016-10-30 LAB — CBC WITH DIFFERENTIAL/PLATELET
Basophils Absolute: 0 10*3/uL (ref 0.0–0.1)
Basophils Relative: 0 %
Eosinophils Absolute: 0.3 10*3/uL (ref 0.0–0.7)
Eosinophils Relative: 2 %
HCT: 35.2 % — ABNORMAL LOW (ref 36.0–46.0)
Hemoglobin: 11.9 g/dL — ABNORMAL LOW (ref 12.0–15.0)
Lymphocytes Relative: 10 %
Lymphs Abs: 1.3 10*3/uL (ref 0.7–4.0)
MCH: 27.7 pg (ref 26.0–34.0)
MCHC: 33.8 g/dL (ref 30.0–36.0)
MCV: 82.1 fL (ref 78.0–100.0)
Monocytes Absolute: 0.8 10*3/uL (ref 0.1–1.0)
Monocytes Relative: 6 %
Neutro Abs: 10.3 10*3/uL — ABNORMAL HIGH (ref 1.7–7.7)
Neutrophils Relative %: 82 %
Platelets: 253 10*3/uL (ref 150–400)
RBC: 4.29 MIL/uL (ref 3.87–5.11)
RDW: 13.5 % (ref 11.5–15.5)
WBC: 12.7 10*3/uL — ABNORMAL HIGH (ref 4.0–10.5)

## 2016-10-30 LAB — URINALYSIS, ROUTINE W REFLEX MICROSCOPIC
Bilirubin Urine: NEGATIVE
Glucose, UA: NEGATIVE mg/dL
Hgb urine dipstick: NEGATIVE
Ketones, ur: NEGATIVE mg/dL
Nitrite: NEGATIVE
Protein, ur: NEGATIVE mg/dL
Specific Gravity, Urine: 1.012 (ref 1.005–1.030)
pH: 5 (ref 5.0–8.0)

## 2016-10-30 LAB — HCG, QUANTITATIVE, PREGNANCY: hCG, Beta Chain, Quant, S: 18602 m[IU]/mL — ABNORMAL HIGH (ref <5)

## 2016-10-30 MED ORDER — CYCLOBENZAPRINE HCL 10 MG PO TABS: 10.0000 mg | ORAL_TABLET | Freq: Three times a day (TID) | ORAL | 0 refills | Status: DC | PRN

## 2016-10-30 NOTE — ED Notes (Signed)
OB rapid at bedside 

## 2016-10-30 NOTE — Progress Notes (Signed)
RROB called to patient's bedside for patient who presents with complaints of no fetal movement since yesterday afternoon; patient is a G1P0 who is [redacted] weeks along in her pregnancy at this time; patient denies problems with pregnancy up to this point; she denies bleeding or LOF; patient states she has also been crampy since yesterday afternoon; EFM applied after bedside ultrasound and held in place for NST; SVE performed and 0/thick/balot; patient reports feeling back pain at this time and reports having a cough and sinus pressure; Dr Shawnie PonsPratt called and notified of patient's complaints and EFM and orders given for OB clearance and Flexiril 10mg  TID PRN as needed for back pain; ED staff made aware at this time

## 2016-10-30 NOTE — ED Notes (Signed)
Pt ambulatory at DC, NAD, VSS. Pt verbalizes DC teaching and medications.

## 2016-10-30 NOTE — ED Triage Notes (Signed)
Patient reports low abdominal cramping with no fetal movement onset yesterday , advised by her OB to go to ER , she is [redacted] weeks pregnant G1P0 , rapid response nurse notified by Dipl[employer] Services operational officersecretary. Denies vaginal bleeding or discharge .

## 2016-10-30 NOTE — Discharge Instructions (Signed)
Return here as needed. Use Robitussin for cough.

## 2016-11-01 LAB — URINE CULTURE

## 2016-11-01 NOTE — ED Provider Notes (Signed)
[facility]-EMERGENCY DEPT Provider Note   CSN: [id number] Arrival date & time: 10/30/16  1837     History   Chief Complaint Chief Complaint  Patient presents with  . Abdominal Cramping    [redacted] weeks Pregnant   . No Fetal Movement    HPI Heather Riggs is a 26 y.o. female.  HPI Patient presents to the emergency department with intermittent abdominal cramping since yesterday and decreased fetal movement.  The patient states that she is [redacted] weeks pregnant and stated that she noticed intermittent abdominal cramping yesterday but denies any bleeding vaginally or fluids from her vaginal area.  The patient states that nothing seems make the condition better or worse.  She did not take any medications other than Tylenol prior to arrival without relief of her symptoms.  Patient, states she is also having some back discomfort as wellThe patient denies chest pain, shortness of breath, headache,blurred vision, neck pain, fever, cough, weakness, numbness, dizziness, anorexia, edema,nausea, vomiting, diarrhea, rash,dysuria, hematemesis, bloody stool, near syncope, or syncope. Past Medical History:  Diagnosis Date  . Hypertension     There are no active problems to display for this patient.   Past Surgical History:  Procedure Laterality Date  . BLADDER SURGERY     26 years old    OB History    Gravida Para Term Preterm AB Living   1 0 0 0 0 0   SAB TAB Ectopic Multiple Live Births   0 0 0 0 0       Home Medications    Prior to Admission medications   Medication Sig Start Date End Date Taking? Authorizing Provider  acetaminophen (TYLENOL) 325 MG tablet Take 650 mg by mouth every 6 (six) hours as needed for mild pain, moderate pain or headache.    Yes Historical Provider, MD  Prenatal Vit-Fe Fumarate-FA (PRENATAL MULTIVITAMIN) TABS tablet Take 1 tablet by mouth daily at 12 noon.   Yes Historical Provider, MD  cyclobenzaprine (FLEXERIL) 10 MG tablet Take 1 tablet (10 mg total) by mouth 3  (three) times daily as needed for muscle spasms. 10/30/16   Charlestine Night, PA-C    Family History No family history on file.  Social History Social History  Substance Use Topics  . Smoking status: Never Smoker  . Smokeless tobacco: Never Used  . Alcohol use No     Allergies   Penicillins   Review of Systems Review of Systems All other systems negative except as documented in the HPI. All pertinent positives and negatives as reviewed in the HPI.  Physical Exam Updated Vital Signs BP 113/69   Pulse 98   Temp 99 F (37.2 C) (Oral)   Resp 18   LMP 05/09/2016 Comment: only 2 days long  SpO2 100%   Physical Exam  Constitutional: She is oriented to person, place, and time. She appears well-developed and well-nourished. No distress.  HENT:  Head: Normocephalic and atraumatic.  Mouth/Throat: Oropharynx is clear and moist.  Eyes: Pupils are equal, round, and reactive to light.  Neck: Normal range of motion. Neck supple.  Cardiovascular: Normal rate, regular rhythm and normal heart sounds.  Exam reveals no gallop and no friction rub.   No murmur heard. Pulmonary/Chest: Effort normal and breath sounds normal. No respiratory distress. She has no wheezes.  Abdominal: Soft. Bowel sounds are normal. She exhibits no distension. There is no tenderness. There is no rebound and no guarding.  Patient has a gravid abdomen, but there is no distention or tenderness  Neurological: She is alert and oriented to person, place, and time. She exhibits normal muscle tone. Coordination normal.  Skin: Skin is warm and dry. No rash noted. No erythema.  Psychiatric: She has a normal mood and affect. Her behavior is normal.  Nursing note and vitals reviewed.    ED Treatments / Results  Labs (all labs ordered are listed, but only abnormal results are displayed) Labs Reviewed  CBC WITH DIFFERENTIAL/PLATELET - Abnormal; Notable for the following:       Result Value   WBC 12.7 (*)     Hemoglobin 11.9 (*)    HCT 35.2 (*)    Neutro Abs 10.3 (*)    All other components within normal limits  COMPREHENSIVE METABOLIC PANEL - Abnormal; Notable for the following:    CO2 21 (*)    BUN 5 (*)    Albumin 3.3 (*)    All other components within normal limits  HCG, QUANTITATIVE, PREGNANCY - Abnormal; Notable for the following:    hCG, Beta Chain, Quant, S 18,602 (*)    All other components within normal limits  URINALYSIS, ROUTINE W REFLEX MICROSCOPIC - Abnormal; Notable for the following:    APPearance HAZY (*)    Leukocytes, UA LARGE (*)    Bacteria, UA RARE (*)    Squamous Epithelial / LPF 0-5 (*)    All other components within normal limits  URINE CULTURE    EKG  EKG Interpretation None       Radiology No results found.  Procedures Procedures (including critical care time)  Medications Ordered in ED Medications - No data to display   Initial Impression / Assessment and Plan / ED Course  I have reviewed the triage vital signs and the nursing notes.  Pertinent labs & imaging results that were available during my care of the patient were reviewed by me and considered in my medical decision making (see chart for details).     The OB resident response nurse has come to evaluate the patient.  She is placed her on the monitor and observed the baby over this timeframe.  The baby appears healthy.  The patient is not having any symptoms at this time.  He [facility] and found to have normal heart rate and was moving  Final Clinical Impressions(s) / ED Diagnoses   Final diagnoses:  Pregnancy, unspecified gestational age  Cramping affecting pregnancy, antepartum    New Prescriptions Discharge Medication List as of 10/30/2016  9:50 PM    START taking these medications   Details  cyclobenzaprine (FLEXERIL) 10 MG tablet Take 1 tablet (10 mg total) by mouth 3 (three) times daily as needed for muscle spasms., Starting Tue 10/30/2016,  Print         Charlestine NightChristopher Malin Cervini, PA-C 11/01/16 0118    Charlynne Panderavid Hsienta Yao, MD 11/01/16 [phone]

## 2016-12-08 ENCOUNTER — Encounter (HOSPITAL_COMMUNITY): Payer: Self-pay

## 2016-12-08 ENCOUNTER — Inpatient Hospital Stay (HOSPITAL_COMMUNITY)
Admission: AD | Admit: 2016-12-08 | Discharge: 2016-12-08 | Disposition: A | Discharge status: Home / Self Care | Payer: Medicaid Other | Source: Ambulatory Visit | Attending: Obstetrics & Gynecology | Admitting: Obstetrics & Gynecology

## 2016-12-08 DIAGNOSIS — Z880 Allergy status to penicillin: Secondary | ICD-10-CM | POA: Diagnosis not present

## 2016-12-08 DIAGNOSIS — O368130 Decreased fetal movements, third trimester, not applicable or unspecified: Secondary | ICD-10-CM

## 2016-12-08 DIAGNOSIS — O163 Unspecified maternal hypertension, third trimester: Secondary | ICD-10-CM | POA: Insufficient documentation

## 2016-12-08 DIAGNOSIS — K219 Gastro-esophageal reflux disease without esophagitis: Secondary | ICD-10-CM

## 2016-12-08 DIAGNOSIS — Z3A31 31 weeks gestation of pregnancy: Secondary | ICD-10-CM | POA: Diagnosis not present

## 2016-12-08 DIAGNOSIS — Z3689 Encounter for other specified antenatal screening: Secondary | ICD-10-CM

## 2016-12-08 DIAGNOSIS — O26893 Other specified pregnancy related conditions, third trimester: Secondary | ICD-10-CM | POA: Insufficient documentation

## 2016-12-08 DIAGNOSIS — Z79899 Other long term (current) drug therapy: Secondary | ICD-10-CM | POA: Insufficient documentation

## 2016-12-08 DIAGNOSIS — R1010 Upper abdominal pain, unspecified: Secondary | ICD-10-CM | POA: Insufficient documentation

## 2016-12-08 DIAGNOSIS — O99613 Diseases of the digestive system complicating pregnancy, third trimester: Secondary | ICD-10-CM | POA: Insufficient documentation

## 2016-12-08 LAB — URINALYSIS, ROUTINE W REFLEX MICROSCOPIC
Bilirubin Urine: NEGATIVE
Glucose, UA: NEGATIVE mg/dL
Hgb urine dipstick: NEGATIVE
Ketones, ur: NEGATIVE mg/dL
Nitrite: NEGATIVE
Protein, ur: NEGATIVE mg/dL
Specific Gravity, Urine: 1.01 (ref 1.005–1.030)
pH: 5 (ref 5.0–8.0)

## 2016-12-08 MED ORDER — GI COCKTAIL ~~LOC~~
30.0000 mL | Freq: Once | ORAL | Status: AC
  Administered 2016-12-08: 30 mL via ORAL
  Filled 2016-12-08: qty 30

## 2016-12-08 MED ORDER — RANITIDINE HCL 150 MG PO TABS: 150.0000 mg | ORAL_TABLET | Freq: Every day | ORAL | 1 refills | Status: DC

## 2016-12-08 NOTE — MAU Provider Note (Signed)
History     CSN: [id number]  Arrival date and time: 12/08/16 1319   First Provider Initiated Contact with Patient 12/08/16 1400      Chief Complaint  Patient presents with  . Abdominal Pain   G1 @31 .4 weeks here with epigastric pain and no FM. She reports no FM in the last 24 hrs. Admits to being busy yesterday. Pain started 3 hrs ago just after she had eaten an orange. She also had a brief onset of nausea at the time. She describes the pain as sharp and constant. Rates 4/10 which is better than when she first arrived. She has not taken anything for the pain. She reports a similar episode of pain earlier in the pregnancy that responded well to a GI cocktail. She reports good FM since EFM applied here. No VB, LOF, or ctx.   OB History    Gravida Para Term Preterm AB Living   1 0 0 0 0 0   SAB TAB Ectopic Multiple Live Births   0 0 0 0 0      Past Medical History:  Diagnosis Date  . Hypertension     Past Surgical History:  Procedure Laterality Date  . BLADDER SURGERY     26 years old    History reviewed. No pertinent family history.  Social History  Substance Use Topics  . Smoking status: Never Smoker  . Smokeless tobacco: Never Used  . Alcohol use No    Allergies:  Allergies  Allergen Reactions  . Penicillins Swelling and Other (See Comments)    Reaction:  Facial swelling  Has patient had a PCN reaction causing immediate rash, facial/tongue/throat swelling, SOB or lightheadedness with hypotension: Yes Has patient had a PCN reaction causing severe rash involving mucus membranes or skin necrosis: No Has patient had a PCN reaction that required hospitalization No Has patient had a PCN reaction occurring within the last 10 years: Yes If all of the above answers are "NO", then may proceed with Cephalosporin use.    Prescriptions Prior to Admission  Medication Sig Dispense Refill Last Dose  . acetaminophen (TYLENOL) 325 MG tablet Take 650 mg by mouth every 6 (six)  hours as needed for mild pain, moderate pain or headache.    10/29/2016 at Unknown time  . cyclobenzaprine (FLEXERIL) 10 MG tablet Take 1 tablet (10 mg total) by mouth 3 (three) times daily as needed for muscle spasms. 15 tablet 0   . Prenatal Vit-Fe Fumarate-FA (PRENATAL MULTIVITAMIN) TABS tablet Take 1 tablet by mouth daily at 12 noon.   10/30/2016 at Unknown time    Review of Systems  Gastrointestinal: Positive for abdominal pain and nausea. Negative for diarrhea and vomiting.  Genitourinary: Negative for vaginal bleeding.   Physical Exam   Blood pressure 117/60, pulse 95, temperature 98.1 F (36.7 C), temperature source Oral, resp. rate 16, height 5\' 11"  (1.803 m), weight 100.7 kg (222 lb), last menstrual period 05/09/2016.  Physical Exam  Nursing note and vitals reviewed. Constitutional: She is oriented to person, place, and time. She appears well-developed and well-nourished. No distress.  HENT:  Head: Normocephalic and atraumatic.  Neck: Normal range of motion. Neck supple.  Cardiovascular: Normal rate.   Respiratory: Effort normal.  GI: Soft. She exhibits no distension. There is no tenderness.  gravid  Musculoskeletal: Normal range of motion.  Neurological: She is alert and oriented to person, place, and time.  Skin: Skin is warm and dry.  Psychiatric: She has a normal mood and  affect.   EFM: 135 bpm, mod variability, + accels, no decels Toco: none  Results for orders placed or performed during the hospital encounter of 12/08/16 (from the past 24 hour(s))  Urinalysis, Routine w reflex microscopic     Status: Abnormal   Collection Time: 12/08/16  1:30 PM  Result Value Ref Range   Color, Urine YELLOW YELLOW   APPearance HAZY (A) CLEAR   Specific Gravity, Urine 1.010 1.005 - 1.030   pH 5.0 5.0 - 8.0   Glucose, UA NEGATIVE NEGATIVE mg/dL   Hgb urine dipstick NEGATIVE NEGATIVE   Bilirubin Urine NEGATIVE NEGATIVE   Ketones, ur NEGATIVE NEGATIVE mg/dL   Protein, ur NEGATIVE  NEGATIVE mg/dL   Nitrite NEGATIVE NEGATIVE   Leukocytes, UA MODERATE (A) NEGATIVE   RBC / HPF 0-5 0 - 5 RBC/hpf   WBC, UA 6-30 0 - 5 WBC/hpf   Bacteria, UA RARE (A) NONE SEEN   Squamous Epithelial / LPF 0-5 (A) NONE SEEN   Mucous PRESENT    MAU Course  Procedures GI cocktail  MDM Labs ordered and reviewed. Audible FM heard on EFM by provider. Pain resolved after GI cocktail. Will treat GERD. Stable for discharge home.  Assessment and Plan   1. [redacted] weeks gestation of pregnancy   2. NST (non-stress test) reactive   3. Gastroesophageal reflux disease without esophagitis   4.      Decreased fetal movements  [facility] Z[facility]ac [facility]D [facility]  Allergies as of 12/08/2016      Reactions   Penicillins Swelling, Other (See Comments)   Reaction:  Facial swelling  Has patient had a PCN reaction causing immediate rash, facial/tongue/throat swelling, SOB or lightheadedness with hypotension: Yes Has patient had a PCN reaction causing severe rash involving mucus membranes or skin necrosis: No Has patient had a PCN reaction that required hospitalization No Has patient had a PCN reaction occurring within the last 10 years: Yes If all of the above answers are "NO", then may proceed with Cephalosporin use.      Medication List    STOP taking these medications   cyclobenzaprine 10 MG tablet Commonly known as:  FLEXERIL     TAKE these medications   acetaminophen 325 MG tablet Commonly known as:  TYLENOL Take 650 mg by mouth every 6 (six) hours as needed for mild pain, moderate pain or headache.   prenatal multivitamin Tabs tablet Take 1 tablet by mouth daily at 12 noon.   ranitidine 150 MG tablet Commonly known as:  ZANTAC Take 1 tablet (150 mg total) by mouth at bedtime.      Donette LarryMelanie Halona Amstutz, CNM 12/08/2016, 2:14 PM

## 2016-12-08 NOTE — MAU Note (Signed)
Patient presents with sharp upper abdominal pain, has not felt baby move since yesterday morning, no vaginal bleeding.

## 2016-12-08 NOTE — Progress Notes (Addendum)
Ok to take baby off monitor, per [facility]M

## 2016-12-08 NOTE — Discharge Instructions (Signed)
Fetal Movement Counts Patient Name: ________________________________________________ Patient Due Date: ____________________ What is a fetal movement count? A fetal movement count is the number of times that you feel your baby move during a certain amount of time. This may also be called a fetal kick count. A fetal movement count is recommended for every pregnant woman. You may be asked to start counting fetal movements as early as week 28 of your pregnancy. Pay attention to when your baby is most active. You may notice your baby's sleep and wake cycles. You may also notice things that make your baby move more. You should do a fetal movement count:  When your baby is normally most active.  At the same time each day. A good time to count movements is while you are resting, after having something to eat and drink. How do I count fetal movements? 1. Find a quiet, comfortable area. Sit, or lie down on your side. 2. Write down the date, the start time and stop time, and the number of movements that you felt between those two times. Take this information with you to your health care visits. 3. For 2 hours, count kicks, flutters, swishes, rolls, and jabs. You should feel at least 10 movements during 2 hours. 4. You may stop counting after you have felt 10 movements. 5. If you do not feel 10 movements in 2 hours, have something to eat and drink. Then, keep resting and counting for 1 hour. If you feel at least 4 movements during that hour, you may stop counting. Contact a health care provider if:  You feel fewer than 4 movements in 2 hours.  Your baby is not moving like he or she usually does. Date: ____________ Start time: ____________ Stop time: ____________ Movements: ____________ Date: ____________ Start time: ____________ Stop time: ____________ Movements: ____________ Date: ____________ Start time: ____________ Stop time: ____________ Movements: ____________ Date: ____________ Start time:  ____________ Stop time: ____________ Movements: ____________ Date: ____________ Start time: ____________ Stop time: ____________ Movements: ____________ Date: ____________ Start time: ____________ Stop time: ____________ Movements: ____________ Date: ____________ Start time: ____________ Stop time: ____________ Movements: ____________ Date: ____________ Start time: ____________ Stop time: ____________ Movements: ____________ Date: ____________ Start time: ____________ Stop time: ____________ Movements: ____________ This information is not intended to replace advice given to you by your health care provider. Make sure you discuss any questions you have with your health care provider. Document Released: 10/24/2006 Document Revised: 05/23/2016 Document Reviewed: 11/03/2015 Elsevier Interactive Patient Education  2017 [employer] Gastroesophageal Reflux Disease, Adult Normally, food travels down the esophagus and stays in the stomach to be digested. If a person has gastroesophageal reflux disease (GERD), food and stomach acid move back up into the esophagus. When this happens, the esophagus becomes sore and swollen (inflamed). Over time, GERD can make small holes (ulcers) in the lining of the esophagus. Follow these instructions at home: Diet   Follow a diet as told by your doctor. You may need to avoid foods and drinks such as:  Coffee and tea (with or without caffeine).  Drinks that contain alcohol.  Energy drinks and sports drinks.  Carbonated drinks or sodas.  Chocolate and cocoa.  Peppermint and mint flavorings.  Garlic and onions.  Horseradish.  Spicy and acidic foods, such as peppers, chili powder, curry powder, vinegar, hot sauces, and BBQ sauce.  Citrus fruit juices and citrus fruits, such as oranges, lemons, and limes.  Tomato-based foods, such as red sauce, chili, salsa, and pizza with red sauce.  Fried and fatty foods, such as donuts, french fries, potato chips, and  high-fat dressings.  High-fat meats, such as hot dogs, rib eye steak, sausage, ham, and bacon.  High-fat dairy items, such as whole milk, butter, and cream cheese.  Eat small meals often. Avoid eating large meals.  Avoid drinking large amounts of liquid with your meals.  Avoid eating meals during the 2-3 hours before bedtime.  Avoid lying down right after you eat.  Do not exercise right after you eat. General instructions   Pay attention to any changes in your symptoms.  Take over-the-counter and prescription medicines only as told by your doctor. Do not take aspirin, ibuprofen, or other NSAIDs unless your doctor says it is okay.  Do not use any tobacco products, including cigarettes, chewing tobacco, and e-cigarettes. If you need help quitting, ask your doctor.  Wear loose clothes. Do not wear anything tight around your waist.  Raise (elevate) the head of your bed about 6 inches (15 cm).  Try to lower your stress. If you need help doing this, ask your doctor.  If you are overweight, lose an amount of weight that is healthy for you. Ask your doctor about a safe weight loss goal.  Keep all follow-up visits as told by your doctor. This is important. Contact a doctor if:  You have new symptoms.  You lose weight and you do not know why it is happening.  You have trouble swallowing, or it hurts to swallow.  You have wheezing or a cough that keeps happening.  Your symptoms do not get better with treatment.  You have a hoarse voice. Get help right away if:  You have pain in your arms, neck, jaw, teeth, or back.  You feel sweaty, dizzy, or light-headed.  You have chest pain or shortness of breath.  You throw up (vomit) and your throw up looks like blood or coffee grounds.  You pass out (faint).  Your poop (stool) is bloody or black.  You cannot swallow, drink, or eat. This information is not intended to replace advice given to you by your health care provider. Make  sure you discuss any questions you have with your health care provider. Document Released: 03/12/2008 Document Revised: 03/01/2016 Document Reviewed: 01/19/2015 Elsevier Interactive Patient Education  2017 [employer]. Food Choices for Gastroesophageal Reflux Disease, Adult When you have gastroesophageal reflux disease (GERD), the foods you eat and your eating habits are very important. Choosing the right foods can help ease your discomfort. What guidelines do I need to follow?  Choose fruits, vegetables, whole grains, and low-fat dairy products.  Choose low-fat meat, fish, and poultry.  Limit fats such as oils, salad dressings, butter, nuts, and avocado.  Keep a food diary. This helps you identify foods that cause symptoms.  Avoid foods that cause symptoms. These may be different for everyone.  Eat small meals often instead of 3 large meals a day.  Eat your meals slowly, in a place where you are relaxed.  Limit fried foods.  Cook foods using methods other than frying.  Avoid drinking alcohol.  Avoid drinking large amounts of liquids with your meals.  Avoid bending over or lying down until 2-3 hours after eating. What foods are not recommended? These are some foods and drinks that may make your symptoms worse: Vegetables  Tomatoes. Tomato juice. Tomato and spaghetti sauce. Chili peppers. Onion and garlic. Horseradish. Fruits  Oranges, grapefruit, and lemon (fruit and juice). Meats  High-fat meats, fish, and poultry.  This includes hot dogs, ribs, ham, sausage, salami, and bacon. Dairy  Whole milk and chocolate milk. Sour cream. Cream. Butter. Ice cream. Cream cheese. Drinks  Coffee and tea. Bubbly (carbonated) drinks or energy drinks. Condiments  Hot sauce. Barbecue sauce. Sweets/Desserts  Chocolate and cocoa. Donuts. Peppermint and spearmint. Fats and Oils  High-fat foods. This includes [place] fries and potato chips. Other  Vinegar. Strong spices. This includes  black pepper, white pepper, red pepper, cayenne, curry powder, cloves, ginger, and chili powder. The items listed above may not be a complete list of foods and drinks to avoid. Contact your dietitian for more information.  This information is not intended to replace advice given to you by your health care provider. Make sure you discuss any questions you have with your health care provider. Document Released: 03/25/2012 Document Revised: 03/01/2016 Document Reviewed: 07/29/2013 Elsevier Interactive Patient Education  2017 [employer].

## 2017-01-09 ENCOUNTER — Ambulatory Visit (INDEPENDENT_AMBULATORY_CARE_PROVIDER_SITE_OTHER): Payer: Self-pay | Admitting: Pediatrics

## 2017-01-09 DIAGNOSIS — Z7681 Expectant parent(s) prebirth pediatrician visit: Secondary | ICD-10-CM

## 2017-01-09 DIAGNOSIS — Z3490 Encounter for supervision of normal pregnancy, unspecified, unspecified trimester: Secondary | ICD-10-CM

## 2017-01-11 NOTE — Progress Notes (Signed)
Prenatal counseling for impending newborn done-- 1st child, 36wks, no complications, prenatal started 7wks Z59.81

## 2017-01-18 ENCOUNTER — Inpatient Hospital Stay (HOSPITAL_COMMUNITY)
Admission: AD | Admit: 2017-01-18 | Discharge: 2017-01-18 | Disposition: A | Discharge status: Home / Self Care | Payer: Medicaid Other | Source: Ambulatory Visit | Attending: Obstetrics and Gynecology | Admitting: Obstetrics and Gynecology

## 2017-01-18 ENCOUNTER — Encounter (HOSPITAL_COMMUNITY): Payer: Self-pay

## 2017-01-18 DIAGNOSIS — R109 Unspecified abdominal pain: Secondary | ICD-10-CM

## 2017-01-18 DIAGNOSIS — Z880 Allergy status to penicillin: Secondary | ICD-10-CM | POA: Insufficient documentation

## 2017-01-18 DIAGNOSIS — R102 Pelvic and perineal pain: Secondary | ICD-10-CM | POA: Diagnosis not present

## 2017-01-18 DIAGNOSIS — Z79899 Other long term (current) drug therapy: Secondary | ICD-10-CM | POA: Insufficient documentation

## 2017-01-18 DIAGNOSIS — N949 Unspecified condition associated with female genital organs and menstrual cycle: Secondary | ICD-10-CM

## 2017-01-18 DIAGNOSIS — Z3A37 37 weeks gestation of pregnancy: Secondary | ICD-10-CM | POA: Diagnosis not present

## 2017-01-18 DIAGNOSIS — R1013 Epigastric pain: Secondary | ICD-10-CM | POA: Insufficient documentation

## 2017-01-18 DIAGNOSIS — O26893 Other specified pregnancy related conditions, third trimester: Secondary | ICD-10-CM | POA: Insufficient documentation

## 2017-01-18 LAB — URINALYSIS, ROUTINE W REFLEX MICROSCOPIC
Bilirubin Urine: NEGATIVE
Glucose, UA: NEGATIVE mg/dL
Hgb urine dipstick: NEGATIVE
Ketones, ur: NEGATIVE mg/dL
Nitrite: NEGATIVE
Protein, ur: NEGATIVE mg/dL
Specific Gravity, Urine: 1.009 (ref 1.005–1.030)
pH: 6 (ref 5.0–8.0)

## 2017-01-18 MED ORDER — GI COCKTAIL ~~LOC~~
30.0000 mL | Freq: Once | ORAL | Status: AC
  Administered 2017-01-18: 30 mL via ORAL
  Filled 2017-01-18: qty 30

## 2017-01-18 MED ORDER — PANTOPRAZOLE SODIUM 20 MG PO TBEC: 20.0000 mg | DELAYED_RELEASE_TABLET | Freq: Every day | ORAL | 1 refills | Status: DC

## 2017-01-18 NOTE — Progress Notes (Addendum)
G1 @ 37.[redacted] wksga. Presents to triage for left sided pain that does not go away for past 3 days. Seen at the doctors office yesteday and informed to come in if pain gets worse. Pt states took tylenol yesterday but didn't help much.  Pt talking and texting on phone. Denies LOF or bleeding. + FM.   EFM applied.   Provider at bs. Ordered for GI cocktail.   1530: pt smiling and laughing and texting.  med administered per order.   1605: pt states feeling much better. 3/10  1626: Discharge instructions given with pt understanding. Pt left unit via ambulatory with SO. Work excuse note given

## 2017-01-18 NOTE — Discharge Instructions (Signed)
Third Trimester of Pregnancy °The third trimester is from week 29 through week 42, months 7 through 9. This trimester is when your unborn baby (fetus) is growing very fast. At the end of the ninth month, the unborn baby is about 20 inches in length. It weighs about 6-10 pounds. °Follow these instructions at home: °· Avoid all smoking, herbs, and alcohol. Avoid drugs not approved by your doctor. °· Do not use any tobacco products, including cigarettes, chewing tobacco, and electronic cigarettes. If you need help quitting, ask your doctor. You may get counseling or other support to help you quit. °· Only take medicine as told by your doctor. Some medicines are safe and some are not during pregnancy. °· Exercise only as told by your doctor. Stop exercising if you start having cramps. °· Eat regular, healthy meals. °· Wear a good support bra if your breasts are tender. °· Do not use hot tubs, steam rooms, or saunas. °· Wear your seat belt when driving. °· Avoid raw meat, uncooked cheese, and liter boxes and soil used by cats. °· Take your prenatal vitamins. °· Take 1500-2000 milligrams of calcium daily starting at the 20th week of pregnancy until you deliver your baby. °· Try taking medicine that helps you poop (stool softener) as needed, and if your doctor approves. Eat more fiber by eating fresh fruit, vegetables, and whole grains. Drink enough fluids to keep your pee (urine) clear or pale yellow. °· Take warm water baths (sitz baths) to soothe pain or discomfort caused by hemorrhoids. Use hemorrhoid cream if your doctor approves. °· If you have puffy, bulging veins (varicose veins), wear support hose. Raise (elevate) your feet for 15 minutes, 3-4 times a day. Limit salt in your diet. °· Avoid heavy lifting, wear low heels, and sit up straight. °· Rest with your legs raised if you have leg cramps or low back pain. °· Visit your dentist if you have not gone during your pregnancy. Use a soft toothbrush to brush your  teeth. Be gentle when you floss. °· You can have sex (intercourse) unless your doctor tells you not to. °· Do not travel far distances unless you must. Only do so with your doctor's approval. °· Take prenatal classes. °· Practice driving to the hospital. °· Pack your hospital bag. °· Prepare the baby's room. °· Go to your doctor visits. °Get help if: °· You are not sure if you are in labor or if your water has broken. °· You are dizzy. °· You have mild cramps or pressure in your lower belly (abdominal). °· You have a nagging pain in your belly area. °· You continue to feel sick to your stomach (nauseous), throw up (vomit), or have watery poop (diarrhea). °· You have bad smelling fluid coming from your vagina. °· You have pain with peeing (urination). °Get help right away if: °· You have a fever. °· You are leaking fluid from your vagina. °· You are spotting or bleeding from your vagina. °· You have severe belly cramping or pain. °· You lose or gain weight rapidly. °· You have trouble catching your breath and have chest pain. °· You notice sudden or extreme puffiness (swelling) of your face, hands, ankles, feet, or legs. °· You have not felt the baby move in over an hour. °· You have severe headaches that do not go away with medicine. °· You have vision changes. °This information is not intended to replace advice given to you by your health care provider. Make   sure you discuss any questions you have with your health care provider. Document Released: 12/19/2009 Document Revised: 03/01/2016 Document Reviewed: 11/25/2012 Elsevier Interactive Patient Education  2017 Else[employer] Inc. Round Ligament Pain The round ligament is a cord of muscle and tissue that helps to support the uterus. It can become a source of pain during pregnancy if it becomes stretched or twisted as the baby grows. The pain usually begins in the second trimester of pregnancy, and it can come and go until the baby is delivered. It is not a serious  problem, and it does not cause harm to the baby. Round ligament pain is usually a short, sharp, and pinching pain, but it can also be a dull, lingering, and aching pain. The pain is felt in the lower side of the abdomen or in the groin. It usually starts deep in the groin and moves up to the outside of the hip area. Pain can occur with:  A sudden change in position.  Rolling over in bed.  Coughing or sneezing.  Physical activity. Follow these instructions at home: Watch your condition for any changes. Take these steps to help with your pain:  When the pain starts, relax. Then try:  Sitting down.  Flexing your knees up to your abdomen.  Lying on your side with one pillow under your abdomen and another pillow between your legs.  Sitting in a warm bath for 15-20 minutes or until the pain goes away.  Take over-the-counter and prescription medicines only as told by your health care provider.  Move slowly when you sit and stand.  Avoid long walks if they cause pain.  Stop or lessen your physical activities if they cause pain. Contact a health care provider if:  Your pain does not go away with treatment.  You feel pain in your back that you did not have before.  Your medicine is not helping. Get help right away if:  You develop a fever or chills.  You develop uterine contractions.  You develop vaginal bleeding.  You develop nausea or vomiting.  You develop diarrhea.  You have pain when you urinate. This information is not intended to replace advice given to you by your health care provider. Make sure you discuss any questions you have with your health care provider. Document Released: 07/03/2008 Document Revised: 03/01/2016 Document Reviewed: 12/01/2014 Elsevier Interactive Patient Education  2017 [employer]. Gastroesophageal Reflux Disease, Adult Normally, food travels down the esophagus and stays in the stomach to be digested. If a person has gastroesophageal  reflux disease (GERD), food and stomach acid move back up into the esophagus. When this happens, the esophagus becomes sore and swollen (inflamed). Over time, GERD can make small holes (ulcers) in the lining of the esophagus. Follow these instructions at home: Diet   Follow a diet as told by your doctor. You may need to avoid foods and drinks such as:  Coffee and tea (with or without caffeine).  Drinks that contain alcohol.  Energy drinks and sports drinks.  Carbonated drinks or sodas.  Chocolate and cocoa.  Peppermint and mint flavorings.  Garlic and onions.  Horseradish.  Spicy and acidic foods, such as peppers, chili powder, curry powder, vinegar, hot sauces, and BBQ sauce.  Citrus fruit juices and citrus fruits, such as oranges, lemons, and limes.  Tomato-based foods, such as red sauce, chili, salsa, and pizza with red sauce.  Fried and fatty foods, such as donuts, french fries, potato chips, and high-fat dressings.  High-fat meats, such  as hot dogs, rib eye steak, sausage, ham, and bacon.  High-fat dairy items, such as whole milk, butter, and cream cheese.  Eat small meals often. Avoid eating large meals.  Avoid drinking large amounts of liquid with your meals.  Avoid eating meals during the 2-3 hours before bedtime.  Avoid lying down right after you eat.  Do not exercise right after you eat. General instructions   Pay attention to any changes in your symptoms.  Take over-the-counter and prescription medicines only as told by your doctor. Do not take aspirin, ibuprofen, or other NSAIDs unless your doctor says it is okay.  Do not use any tobacco products, including cigarettes, chewing tobacco, and e-cigarettes. If you need help quitting, ask your doctor.  Wear loose clothes. Do not wear anything tight around your waist.  Raise (elevate) the head of your bed about 6 inches (15 cm).  Try to lower your stress. If you need help doing this, ask your doctor.  If  you are overweight, lose an amount of weight that is healthy for you. Ask your doctor about a safe weight loss goal.  Keep all follow-up visits as told by your doctor. This is important. Contact a doctor if:  You have new symptoms.  You lose weight and you do not know why it is happening.  You have trouble swallowing, or it hurts to swallow.  You have wheezing or a cough that keeps happening.  Your symptoms do not get better with treatment.  You have a hoarse voice. Get help right away if:  You have pain in your arms, neck, jaw, teeth, or back.  You feel sweaty, dizzy, or light-headed.  You have chest pain or shortness of breath.  You throw up (vomit) and your throw up looks like blood or coffee grounds.  You pass out (faint).  Your poop (stool) is bloody or black.  You cannot swallow, drink, or eat. This information is not intended to replace advice given to you by your health care provider. Make sure you discuss any questions you have with your health care provider. Document Released: 03/12/2008 Document Revised: 03/01/2016 Document Reviewed: 01/19/2015 Elsevier Interactive Patient Education  2017 [employer].

## 2017-01-18 NOTE — MAU Provider Note (Signed)
Chief Complaint:  Abdominal Pain (left side only constant for past 3 days)   First Provider Initiated Contact with Patient 01/18/17 1500     HPI: Heather Riggs is a 26 y.o. G1P0000 at 46w3dwho presents to maternity admissions reporting epigastric and left sided abdominal pain.  Similar to episode on 12/08/16.  Treated with GI cocktail then.  States it is constant.  Has tried nothing for relief. . She reports good fetal movement, denies LOF, vaginal bleeding, vaginal itching/burning, urinary symptoms, h/a, dizziness, n/v, diarrhea, constipation or fever/chills.    Abdominal Pain  This is a recurrent problem. The current episode started today. The onset quality is gradual. The problem occurs intermittently. The problem has been waxing and waning. The pain is located in the LUQ and epigastric region. The pain is mild. The quality of the pain is colicky and cramping. The abdominal pain does not radiate. Pertinent negatives include no constipation, diarrhea, fever, frequency, headaches, myalgias, nausea or vomiting. Nothing aggravates the pain. The pain is relieved by nothing. She has tried nothing for the symptoms.    RN Note: G1 @ 37.[redacted] wksga. Presents to triage for left sided pain that does not go away for past 3 days. Seen at the doctors office yesteday and informed to come in if pain gets worse. Pt states took tylenol yesterday but didn't help much.  Pt talking and texting on phone. Denies LOF or bleeding. + FM.    Past Medical History: Past Medical History:  Diagnosis Date  . Hypertension     Past obstetric history: OB History  Gravida Para Term Preterm AB Living  1 0 0 0 0 0  SAB TAB Ectopic Multiple Live Births  0 0 0 0 0    # Outcome Date GA Lbr Len/2nd Weight Sex Delivery Anes PTL Lv  1 Current               Past Surgical History: Past Surgical History:  Procedure Laterality Date  . BLADDER SURGERY     26 years old    Family History: No family history on file.  Social  History: Social History  Substance Use Topics  . Smoking status: Never Smoker  . Smokeless tobacco: Never Used  . Alcohol use No    Allergies:  Allergies  Allergen Reactions  . Penicillins Swelling and Other (See Comments)    Reaction:  Facial swelling  Has patient had a PCN reaction causing immediate rash, facial/tongue/throat swelling, SOB or lightheadedness with hypotension: Yes Has patient had a PCN reaction causing severe rash involving mucus membranes or skin necrosis: No Has patient had a PCN reaction that required hospitalization No Has patient had a PCN reaction occurring within the last 10 years: Yes If all of the above answers are "NO", then may proceed with Cephalosporin use.    Meds:  Prescriptions Prior to Admission  Medication Sig Dispense Refill Last Dose  . acetaminophen (TYLENOL) 325 MG tablet Take 650 mg by mouth every 6 (six) hours as needed for mild pain, moderate pain or headache.    01/17/2017 at Unknown time  . Prenatal Vit-Fe Fumarate-FA (PRENATAL MULTIVITAMIN) TABS tablet Take 1 tablet by mouth daily at 12 noon.   01/17/2017 at Unknown time  . ranitidine (ZANTAC) 150 MG tablet Take 1 tablet (150 mg total) by mouth at bedtime. (Patient not taking: Reported on 01/18/2017) 30 tablet 1 Not Taking at Unknown time    I have reviewed patient's Past Medical Hx, Surgical Hx, Family Hx, Social Hx,  medications and allergies.   ROS:  Review of Systems  Constitutional: Negative for fever.  Gastrointestinal: Positive for abdominal pain. Negative for constipation, diarrhea, nausea and vomiting.  Genitourinary: Negative for frequency.  Musculoskeletal: Negative for myalgias.  Neurological: Negative for headaches.   Other systems negative  Physical Exam  Patient Vitals for the past 24 hrs:  BP Temp Temp src Pulse Resp SpO2 Height Weight  01/18/17 1430 - - - 94 - 97 % 6' (1.829 m) 231 lb (104.8 kg)  01/18/17 1345 105/61 98.3 F (36.8 C) Oral 93 16 100 % - -    Constitutional: Well-developed, well-nourished female in no acute distress.  Cardiovascular: normal rate and rhythm Respiratory: normal effort, clear to auscultation bilaterally GI: Abd soft, non-tender, gravid appropriate for gestational age.   No rebound or guarding. MS: Extremities nontender, no edema, normal ROM Neurologic: Alert and oriented x 4.  GU: Neg CVAT.  PELVIC EXAM:  Dilation: Closed and long.  Vertex presentation  FHT:  Baseline 140 , moderate variability, accelerations present, no decelerations Contractions: Rare   Labs: Results for orders placed or performed during the hospital encounter of 01/18/17 (from the past 24 hour(s))  Urinalysis, Routine w reflex microscopic     Status: Abnormal   Collection Time: 01/18/17  2:20 PM  Result Value Ref Range   Color, Urine YELLOW YELLOW   APPearance HAZY (A) CLEAR   Specific Gravity, Urine 1.009 1.005 - 1.030   pH 6.0 5.0 - 8.0   Glucose, UA NEGATIVE NEGATIVE mg/dL   Hgb urine dipstick NEGATIVE NEGATIVE   Bilirubin Urine NEGATIVE NEGATIVE   Ketones, ur NEGATIVE NEGATIVE mg/dL   Protein, ur NEGATIVE NEGATIVE mg/dL   Nitrite NEGATIVE NEGATIVE   Leukocytes, UA LARGE (A) NEGATIVE   RBC / HPF 0-5 0 - 5 RBC/hpf   WBC, UA 6-30 0 - 5 WBC/hpf   Bacteria, UA RARE (A) NONE SEEN   Squamous Epithelial / LPF 6-30 (A) NONE SEEN   --/--/O POS (08/26 4098)  Imaging:  No results found.  MAU Course/MDM: I have ordered labs and reviewed results.  NST reviewed Treatments in MAU included GI cocktail with excellent relief. States it relieved both epigastric and left sided pain.    Assessment: [facility] at [redacted]w[redacted]d Abdominal pain during pregnancy in third trimester - Plan: Discharge patient  Epigastric pain - Plan: Discharge patient  Round ligament pain - Plan: Discharge patient    Plan: Discharge home Labor precautions and fetal kick counts Follow up in Office for prenatal visits and recheck of status Rx Protonix to replace  Zantac  Encouraged to return here or to other Urgent Care/ED if she develops worsening of symptoms, increase in pain, fever, or other concerning symptoms.   Pt stable at time of discharge.  Wynelle Bourgeois CNM, MSN Certified Nurse-Midwife 01/18/2017 4:16 PM

## 2017-01-18 NOTE — MAU Note (Signed)
Past 3 days has had pain in upper abd, has gotten worse, today she is having constant pain on her left side. No leaing, spotting this morning

## 2017-01-21 ENCOUNTER — Emergency Department (HOSPITAL_COMMUNITY)
Admission: EM | Admit: 2017-01-21 | Discharge: 2017-01-21 | Disposition: A | Discharge status: Home / Self Care | Payer: Medicaid Other | Attending: Emergency Medicine | Admitting: Emergency Medicine

## 2017-01-21 DIAGNOSIS — K0889 Other specified disorders of teeth and supporting structures: Secondary | ICD-10-CM | POA: Insufficient documentation

## 2017-01-21 DIAGNOSIS — I10 Essential (primary) hypertension: Secondary | ICD-10-CM | POA: Diagnosis not present

## 2017-01-21 NOTE — ED Notes (Signed)
Pt is in stable condition upon d/c and ambulates from ED. 

## 2017-01-21 NOTE — ED Provider Notes (Signed)
[facility]-EMERGENCY DEPT Provider Note   CSN: [id number] Arrival date & time: 01/21/17  0900  By signing my name below, I, Modena Jansky, attest that this documentation has been prepared under the direction and in the presence of non-physician practitioner, Eyvonne Mechanic, PA-C. Electronically Signed: Modena Jansky, Scribe. 01/21/2017. 9:54 AM.  History   Chief Complaint Chief Complaint  Patient presents with  . Dental Pain   The history is provided by the patient. No language interpreter was used.   HPI Comments: Heather Riggs is a 26 y.o. female who presents to the Emergency Department complaining of constant moderate left lower dental pain that started yesterday. She is currently [redacted] weeks pregnant. Her pain worsened today. She has a dental appointment later today. She has been taking Tylenol with some relief. She reports associated left-sided facial swelling. Denies any fever or other complaints at this time. Pt has been seen by dental works and told she cannot have the tooth removed while pregnant.   Past Medical History:  Diagnosis Date  . Hypertension     There are no active problems to display for this patient.   Past Surgical History:  Procedure Laterality Date  . BLADDER SURGERY     26 years old    OB History    Gravida Para Term Preterm AB Living   1 0 0 0 0 0   SAB TAB Ectopic Multiple Live Births   0 0 0 0 0       Home Medications    Prior to Admission medications   Medication Sig Start Date End Date Taking? Authorizing Provider  acetaminophen (TYLENOL) 325 MG tablet Take 650 mg by mouth every 6 (six) hours as needed for mild pain, moderate pain or headache.     Historical Provider, MD  pantoprazole (PROTONIX) 20 MG tablet Take 1 tablet (20 mg total) by mouth daily. 01/18/17   Aviva Signs, CNM  Prenatal Vit-Fe Fumarate-FA (PRENATAL MULTIVITAMIN) TABS tablet Take 1 tablet by mouth daily at 12 noon.    Historical Provider, MD    Family History No family  history on file.  Social History Social History  Substance Use Topics  . Smoking status: Never Smoker  . Smokeless tobacco: Never Used  . Alcohol use No     Allergies   Penicillins   Review of Systems Review of Systems  Constitutional: Negative for fever.  HENT: Positive for dental problem (Lower left) and facial swelling (Left-sided).   Respiratory: Negative for shortness of breath.      Physical Exam Updated Vital Signs BP 117/76 (BP Location: Right Arm)   Pulse (!) 102   Temp 98.1 F (36.7 C) (Oral)   Resp 20   LMP 05/09/2016 Comment: only 2 days long  SpO2 100%    Physical Exam  Constitutional: She appears well-developed and well-nourished. No distress.  HENT:  Head: Normocephalic.  Numerous dental carries. No obvious swelling or signs of infectious etiology. Floor mouth is soft. Neck is supple and has full ROM. Posterior oropharynx has no signs of infection.   Eyes: Conjunctivae are normal.  Neck: Neck supple.  Cardiovascular: Normal rate and regular rhythm.   Pulmonary/Chest: Effort normal.  Abdominal: Soft.  Musculoskeletal: Normal range of motion.  Neurological: She is alert.  Skin: Skin is warm and dry.  Psychiatric: She has a normal mood and affect.  Nursing note and vitals reviewed.    ED Treatments / Results  DIAGNOSTIC STUDIES: Oxygen Saturation is 100% on RA, normal by my  interpretation.    COORDINATION OF CARE: 9:58 AM- Pt advised of plan for treatment and pt agrees.  Labs (all labs ordered are listed, but only abnormal results are displayed) Labs Reviewed - No data to display  EKG  EKG Interpretation None       Radiology No results found.  Procedures Procedures (including critical care time)  Medications Ordered in ED Medications - No data to display   Initial Impression / Assessment and Plan / ED Course  I have reviewed the triage vital signs and the nursing notes.  Pertinent labs & imaging results that were available  during my care of the patient were reviewed by me and considered in my medical decision making (see chart for details).     26 year old female presents today with complaints of dental pain.  She has no signs of infectious etiology on my exam.  She has numerous dental caries.  Patient has already been seen by dentist for this and has a follow-up appointment this afternoon with her dentist.  With no signs of infection no immediate complications no need for further evaluation or management here in the ED.  Patient will be seen by dentist this afternoon for ongoing management.  Final Clinical Impressions(s) / ED Diagnoses   Final diagnoses:  Pain, dental    New Prescriptions Discharge Medication List as of 01/21/2017 10:07 AM     I personally performed the services described in this documentation, which was scribed in my presence. The recorded information has been reviewed and is accurate.    Eyvonne Mechanic, PA-C 01/21/17 1127    Arby Barrette, MD 01/22/17 [phone]

## 2017-01-21 NOTE — ED Triage Notes (Signed)
Pt has had a bottom tooth on the left side that has been causing pain for a few months now off and on pt states for the last 2 days she has had swelling in the left side of her face and chin. Pt c/o of pain in lower bottom side of jaw. Pt is reports to difficulty with swallowing no sob.

## 2017-01-21 NOTE — Discharge Instructions (Signed)
Please follow-up with your dentist today as previously scheduled.

## 2017-01-31 ENCOUNTER — Other Ambulatory Visit (HOSPITAL_COMMUNITY): Payer: Self-pay | Admitting: Nurse Practitioner

## 2017-01-31 DIAGNOSIS — O480 Post-term pregnancy: Secondary | ICD-10-CM

## 2017-02-03 ENCOUNTER — Encounter (HOSPITAL_COMMUNITY): Payer: Self-pay

## 2017-02-03 ENCOUNTER — Inpatient Hospital Stay (HOSPITAL_COMMUNITY)
Admission: AD | Admit: 2017-02-03 | Discharge: 2017-02-03 | Disposition: A | Discharge status: Home / Self Care | Payer: Medicaid Other | Source: Ambulatory Visit | Attending: Obstetrics & Gynecology | Admitting: Obstetrics & Gynecology

## 2017-02-03 DIAGNOSIS — Z3A39 39 weeks gestation of pregnancy: Secondary | ICD-10-CM | POA: Insufficient documentation

## 2017-02-03 DIAGNOSIS — O368130 Decreased fetal movements, third trimester, not applicable or unspecified: Secondary | ICD-10-CM | POA: Diagnosis not present

## 2017-02-03 DIAGNOSIS — O368131 Decreased fetal movements, third trimester, fetus 1: Secondary | ICD-10-CM | POA: Diagnosis not present

## 2017-02-03 DIAGNOSIS — R109 Unspecified abdominal pain: Secondary | ICD-10-CM | POA: Diagnosis present

## 2017-02-03 DIAGNOSIS — O26893 Other specified pregnancy related conditions, third trimester: Secondary | ICD-10-CM | POA: Insufficient documentation

## 2017-02-03 DIAGNOSIS — Z880 Allergy status to penicillin: Secondary | ICD-10-CM | POA: Diagnosis not present

## 2017-02-03 DIAGNOSIS — O368190 Decreased fetal movements, unspecified trimester, not applicable or unspecified: Secondary | ICD-10-CM

## 2017-02-03 LAB — URINALYSIS, ROUTINE W REFLEX MICROSCOPIC
Bilirubin Urine: NEGATIVE
Glucose, UA: NEGATIVE mg/dL
Hgb urine dipstick: NEGATIVE
Ketones, ur: NEGATIVE mg/dL
Nitrite: NEGATIVE
Protein, ur: NEGATIVE mg/dL
Specific Gravity, Urine: 1.011 (ref 1.005–1.030)
pH: 6 (ref 5.0–8.0)

## 2017-02-03 NOTE — Discharge Instructions (Signed)
Abdominal Pain During Pregnancy °Belly (abdominal) pain is common during pregnancy. Most of the time, it is not a serious problem. Other times, it can be a sign that something is wrong with the pregnancy. Always tell your doctor if you have belly pain. °Follow these instructions at home: °Monitor your belly pain for any changes. The following actions may help you feel better: °· Do not have sex (intercourse) or put anything in your vagina until you feel better. °· Rest until your pain stops. °· Drink clear fluids if you feel sick to your stomach (nauseous). Do not eat solid food until you feel better. °· Only take medicine as told by your doctor. °· Keep all doctor visits as told. °Get help right away if: °· You are bleeding, leaking fluid, or pieces of tissue come out of your vagina. °· You have more pain or cramping. °· You keep throwing up (vomiting). °· You have pain when you pee (urinate) or have blood in your pee. °· You have a fever. °· You do not feel your baby moving as much. °· You feel very weak or feel like passing out. °· You have trouble breathing, with or without belly pain. °· You have a very bad headache and belly pain. °· You have fluid leaking from your vagina and belly pain. °· You keep having watery poop (diarrhea). °· Your belly pain does not go away after resting, or the pain gets worse. °This information is not intended to replace advice given to you by your health care provider. Make sure you discuss any questions you have with your health care provider. °Document Released: 09/12/2009 Document Revised: 05/02/2016 Document Reviewed: 04/23/2013 °Elsevier Interactive Patient Education © 2017 [employer] ° °

## 2017-02-03 NOTE — MAU Note (Signed)
Patient presents with no fetal movement since 8:00 pm last night, having lower abdominal cramping since this morning.

## 2017-02-03 NOTE — MAU Provider Note (Signed)
History   G1 @ 39.5 wks I with abd pain that started earlier today and decreased fetal movement. Denies ROM or vag bleeding.  CSN: [id number]  Arrival date & time 02/03/17  1444   None     Chief Complaint  Patient presents with  . Abdominal Cramping  . Decreased Fetal Movement    HPI  Past Medical History:  Diagnosis Date  . Hypertension     Past Surgical History:  Procedure Laterality Date  . BLADDER SURGERY     26 years old    History reviewed. No pertinent family history.  Social History  Substance Use Topics  . Smoking status: Never Smoker  . Smokeless tobacco: Never Used  . Alcohol use No    OB History    Gravida Para Term Preterm AB Living   1 0 0 0 0 0   SAB TAB Ectopic Multiple Live Births   0 0 0 0 0      Review of Systems  Constitutional: Negative.   HENT: Negative.   Eyes: Negative.   Respiratory: Negative.   Cardiovascular: Negative.   Gastrointestinal: Positive for abdominal pain.  Endocrine: Negative.   Genitourinary: Negative.   Musculoskeletal: Negative.   Skin: Negative.   Allergic/Immunologic: Negative.   Neurological: Negative.   Hematological: Negative.   Psychiatric/Behavioral: Negative.     Allergies  Penicillins  Home Medications    BP 121/69 (BP Location: Right Arm)   Pulse 99   Temp 98.2 F (36.8 C)   Resp 18   Ht 6' (1.829 m)   Wt 234 lb (106.1 kg)   LMP 05/09/2016 Comment: only 2 days long  BMI 31.74 kg/m   Physical Exam  Constitutional: She is oriented to person, place, and time. She appears well-developed and well-nourished.  HENT:  Head: Normocephalic.  Neck: Normal range of motion.  Cardiovascular: Normal rate, regular rhythm, normal heart sounds and intact distal pulses.   Pulmonary/Chest: Effort normal and breath sounds normal.  Abdominal: Soft. Bowel sounds are normal.  Genitourinary: Vagina normal and uterus normal.  Musculoskeletal: Normal range of motion.  Neurological: She is alert and  oriented to person, place, and time. She has normal reflexes.  Skin: Skin is warm and dry.  Psychiatric: She has a normal mood and affect. Her behavior is normal. Judgment and thought content normal.    MAU Course  Procedures (including critical care time)  Labs Reviewed  URINALYSIS, ROUTINE W REFLEX MICROSCOPIC - Abnormal; Notable for the following:       Result Value   APPearance HAZY (*)    Leukocytes, UA LARGE (*)    Bacteria, UA RARE (*)    Squamous Epithelial / LPF 6-30 (*)    All other components within normal limits   No results found.   1. Decreased fetal movement affecting management of pregnancy, antepartum, single or unspecified fetus   2. Abdominal pain in pregnancy, third trimester       MDM  VSS, FHR pattern reassuring. Mild occasional uc. SVE 1/post/th/high. Pt now feeling movement since arrival on unit. Will d/c home.

## 2017-02-06 ENCOUNTER — Inpatient Hospital Stay (HOSPITAL_COMMUNITY): Payer: Medicaid Other | Admitting: Anesthesiology

## 2017-02-06 ENCOUNTER — Encounter (HOSPITAL_COMMUNITY): Payer: Self-pay | Admitting: *Deleted

## 2017-02-06 ENCOUNTER — Inpatient Hospital Stay (HOSPITAL_COMMUNITY)
Admission: AD | Admit: 2017-02-06 | Discharge: 2017-02-08 | DRG: 775 | Disposition: A | Discharge status: Home / Self Care | Payer: Medicaid Other | Source: Ambulatory Visit | Attending: Obstetrics and Gynecology | Admitting: Obstetrics and Gynecology

## 2017-02-06 DIAGNOSIS — Z3A40 40 weeks gestation of pregnancy: Secondary | ICD-10-CM

## 2017-02-06 DIAGNOSIS — Z880 Allergy status to penicillin: Secondary | ICD-10-CM

## 2017-02-06 DIAGNOSIS — Z3493 Encounter for supervision of normal pregnancy, unspecified, third trimester: Secondary | ICD-10-CM | POA: Diagnosis present

## 2017-02-06 LAB — CBC
HCT: 32.5 % — ABNORMAL LOW (ref 36.0–46.0)
Hemoglobin: 11 g/dL — ABNORMAL LOW (ref 12.0–15.0)
MCH: 26.8 pg (ref 26.0–34.0)
MCHC: 33.8 g/dL (ref 30.0–36.0)
MCV: 79.3 fL (ref 78.0–100.0)
Platelets: 273 10*3/uL (ref 150–400)
RBC: 4.1 MIL/uL (ref 3.87–5.11)
RDW: 13.7 % (ref 11.5–15.5)
WBC: 11.8 10*3/uL — ABNORMAL HIGH (ref 4.0–10.5)

## 2017-02-06 LAB — TYPE AND SCREEN
ABO/RH(D): O POS
Antibody Screen: NEGATIVE

## 2017-02-06 LAB — RPR: RPR Ser Ql: NONREACTIVE

## 2017-02-06 MED ORDER — ACETAMINOPHEN 325 MG PO TABS: 650.0000 mg | ORAL_TABLET | ORAL | Status: DC | PRN

## 2017-02-06 MED ORDER — ONDANSETRON HCL 4 MG PO TABS: 4.0000 mg | ORAL_TABLET | ORAL | Status: DC | PRN

## 2017-02-06 MED ORDER — OXYCODONE-ACETAMINOPHEN 5-325 MG PO TABS: 2.0000 | ORAL_TABLET | ORAL | Status: DC | PRN

## 2017-02-06 MED ORDER — PHENYLEPHRINE 40 MCG/ML (10ML) SYRINGE FOR IV PUSH (FOR BLOOD PRESSURE SUPPORT)
80.0000 ug | PREFILLED_SYRINGE | INTRAVENOUS | Status: DC | PRN
  Filled 2017-02-06: qty 5

## 2017-02-06 MED ORDER — SIMETHICONE 80 MG PO CHEW: 80.0000 mg | CHEWABLE_TABLET | ORAL | Status: DC | PRN

## 2017-02-06 MED ORDER — OXYTOCIN BOLUS FROM INFUSION
500.0000 mL | Freq: Once | INTRAVENOUS | Status: AC
  Administered 2017-02-06: 500 mL via INTRAVENOUS

## 2017-02-06 MED ORDER — PHENYLEPHRINE 40 MCG/ML (10ML) SYRINGE FOR IV PUSH (FOR BLOOD PRESSURE SUPPORT)
80.0000 ug | PREFILLED_SYRINGE | INTRAVENOUS | Status: DC | PRN
  Filled 2017-02-06: qty 10
  Filled 2017-02-06: qty 5

## 2017-02-06 MED ORDER — LACTATED RINGERS IV SOLN
INTRAVENOUS | Status: DC
  Administered 2017-02-06: 08:00:00 via INTRAVENOUS

## 2017-02-06 MED ORDER — EPHEDRINE 5 MG/ML INJ
10.0000 mg | INTRAVENOUS | Status: DC | PRN
  Filled 2017-02-06: qty 2

## 2017-02-06 MED ORDER — TERBUTALINE SULFATE 1 MG/ML IJ SOLN
0.2500 mg | Freq: Once | INTRAMUSCULAR | Status: DC | PRN
  Filled 2017-02-06: qty 1

## 2017-02-06 MED ORDER — LACTATED RINGERS IV SOLN: 500.0000 mL | INTRAVENOUS | Status: DC | PRN

## 2017-02-06 MED ORDER — SENNOSIDES-DOCUSATE SODIUM 8.6-50 MG PO TABS
2.0000 | ORAL_TABLET | ORAL | Status: DC
  Administered 2017-02-07 (×2): 2 via ORAL
  Filled 2017-02-06 (×2): qty 2

## 2017-02-06 MED ORDER — PRENATAL MULTIVITAMIN CH
1.0000 | ORAL_TABLET | Freq: Every day | ORAL | Status: DC
Start: 2017-02-07 — End: 2017-02-08
  Administered 2017-02-07 – 2017-02-08 (×2): 1 via ORAL
  Filled 2017-02-06 (×2): qty 1

## 2017-02-06 MED ORDER — OXYTOCIN 40 UNITS IN LACTATED RINGERS INFUSION - SIMPLE MED
2.5000 [IU]/h | INTRAVENOUS | Status: DC
  Administered 2017-02-06: 2.5 [IU]/h via INTRAVENOUS
  Filled 2017-02-06: qty 1000

## 2017-02-06 MED ORDER — DIPHENHYDRAMINE HCL 25 MG PO CAPS: 25.0000 mg | ORAL_CAPSULE | Freq: Four times a day (QID) | ORAL | Status: DC | PRN

## 2017-02-06 MED ORDER — ONDANSETRON HCL 4 MG/2ML IJ SOLN
4.0000 mg | Freq: Four times a day (QID) | INTRAMUSCULAR | Status: DC | PRN
Start: 2017-02-06 — End: 2017-02-08

## 2017-02-06 MED ORDER — OXYCODONE-ACETAMINOPHEN 5-325 MG PO TABS: 1.0000 | ORAL_TABLET | ORAL | Status: DC | PRN

## 2017-02-06 MED ORDER — IBUPROFEN 600 MG PO TABS
600.0000 mg | ORAL_TABLET | Freq: Four times a day (QID) | ORAL | Status: DC
  Administered 2017-02-06 – 2017-02-08 (×8): 600 mg via ORAL
  Filled 2017-02-06 (×8): qty 1

## 2017-02-06 MED ORDER — DIBUCAINE 1 % RE OINT: 1.0000 "application " | TOPICAL_OINTMENT | RECTAL | Status: DC | PRN

## 2017-02-06 MED ORDER — SOD CITRATE-CITRIC ACID 500-334 MG/5ML PO SOLN: 30.0000 mL | ORAL | Status: DC | PRN

## 2017-02-06 MED ORDER — DIPHENHYDRAMINE HCL 50 MG/ML IJ SOLN: 12.5000 mg | INTRAMUSCULAR | Status: DC | PRN

## 2017-02-06 MED ORDER — LIDOCAINE HCL (PF) 1 % IJ SOLN
30.0000 mL | INTRAMUSCULAR | Status: DC | PRN
  Filled 2017-02-06: qty 30

## 2017-02-06 MED ORDER — LIDOCAINE HCL (PF) 1 % IJ SOLN
INTRAMUSCULAR | Status: DC | PRN
  Administered 2017-02-06: 4 mL

## 2017-02-06 MED ORDER — COCONUT OIL OIL: 1.0000 "application " | TOPICAL_OIL | Status: DC | PRN

## 2017-02-06 MED ORDER — BENZOCAINE-MENTHOL 20-0.5 % EX AERO
1.0000 "application " | INHALATION_SPRAY | CUTANEOUS | Status: DC | PRN
  Administered 2017-02-07: 1 via TOPICAL
  Filled 2017-02-06: qty 56

## 2017-02-06 MED ORDER — WITCH HAZEL-GLYCERIN EX PADS: 1.0000 "application " | MEDICATED_PAD | CUTANEOUS | Status: DC | PRN

## 2017-02-06 MED ORDER — ZOLPIDEM TARTRATE 5 MG PO TABS: 5.0000 mg | ORAL_TABLET | Freq: Every evening | ORAL | Status: DC | PRN

## 2017-02-06 MED ORDER — FENTANYL CITRATE (PF) 100 MCG/2ML IJ SOLN: 100.0000 ug | INTRAMUSCULAR | Status: DC | PRN

## 2017-02-06 MED ORDER — OXYTOCIN 40 UNITS IN LACTATED RINGERS INFUSION - SIMPLE MED
1.0000 m[IU]/min | INTRAVENOUS | Status: DC
  Administered 2017-02-06: 2 m[IU]/min via INTRAVENOUS

## 2017-02-06 MED ORDER — LACTATED RINGERS IV SOLN
500.0000 mL | Freq: Once | INTRAVENOUS | Status: AC
  Administered 2017-02-06: 500 mL via INTRAVENOUS

## 2017-02-06 MED ORDER — TETANUS-DIPHTH-ACELL PERTUSSIS 5-2.5-18.5 LF-MCG/0.5 IM SUSP: 0.5000 mL | Freq: Once | INTRAMUSCULAR | Status: DC

## 2017-02-06 MED ORDER — ONDANSETRON HCL 4 MG/2ML IJ SOLN: 4.0000 mg | INTRAMUSCULAR | Status: DC | PRN

## 2017-02-06 MED ORDER — FENTANYL 2.5 MCG/ML BUPIVACAINE 1/10 % EPIDURAL INFUSION (WH - ANES): 14.0000 mL/h | INTRAMUSCULAR | Status: DC | PRN

## 2017-02-06 MED ORDER — FENTANYL 2.5 MCG/ML BUPIVACAINE 1/10 % EPIDURAL INFUSION (WH - ANES)
14.0000 mL/h | INTRAMUSCULAR | Status: DC | PRN
  Administered 2017-02-06 (×2): 14 mL/h via EPIDURAL
  Filled 2017-02-06 (×2): qty 100

## 2017-02-06 NOTE — Anesthesia Pain Management Evaluation Note (Signed)
  CRNA Pain Management Visit Note  Patient: Heather Riggs, 26 y.o., female  "Hello I am a member of the anesthesia team at [facility]. We have an anesthesia team available at all times to provide care throughout the hospital, including epidural management and anesthesia for C-section. I don't know your plan for the delivery whether it a natural birth, water birth, IV sedation, nitrous supplementation, doula or epidural, but we want to meet your pain goals."   1.Was your pain managed to your expectations on prior hospitalizations?   No prior hospitalizations  2.What is your expectation for pain management during this hospitalization?     Epidural  3.How can we help you reach that goal? Epidural in place.  Record the patient's initial score and the patient's pain goal.   Pain: 0  Pain Goal: 9 prior to epidural placement. The [facility] wants you to be able to say your pain was always managed very well.  Kashaun Bebo L 02/06/2017

## 2017-02-06 NOTE — Anesthesia Procedure Notes (Signed)
Epidural Patient location during procedure: OB  Staffing Anesthesiologist: Aylinn Rydberg Performed: anesthesiologist   Preanesthetic Checklist Completed: patient identified, pre-op evaluation, timeout performed, IV checked, risks and benefits discussed and monitors and equipment checked  Epidural Patient position: sitting Prep: site prepped and draped and DuraPrep Patient monitoring: heart rate, continuous pulse ox and blood pressure Approach: midline Location: L2-L3 Injection technique: LOR air and LOR saline  Needle:  Needle type: Tuohy  Needle gauge: 17 G Needle length: 9 cm Needle insertion depth: 6 cm Catheter type: closed end flexible Catheter size: 19 Gauge Catheter at skin depth: 12 cm Test dose: negative  Assessment Sensory level: T8 Events: blood not aspirated, injection not painful, no injection resistance, negative IV test and no paresthesia  Additional Notes Reason for block:procedure for pain     

## 2017-02-06 NOTE — MAU Note (Signed)
PT  SAYS UC  HURT BAD  SINCE  0100.    [facility]  AT HD.    NO VE  IN OFFICE .   DENIES HSV AND  MRSA.  GBS-   UNSURE

## 2017-02-06 NOTE — Lactation Note (Signed)
This note was copied from a baby's chart. Lactation Consultation Note  Patient Name: Heather Riggs [id number] Date: 02/06/2017 Reason for consult: Initial assessment  Assisted with first latch to the breast in [facility].  Baby STS on Mom's chest, and assisted with laid back position, and then sat Mom up with pillow support.  Demonstrated hand expression, no colostrum visible. Baby latches easily onto areola with wide gape of mouth. Basics reviewed.  Encouraged Mom to massage breasts, and do hand expression often.  Talked about importance of baby remaining STS and feeding often on cue.  Goal is 8-12 feedings per 24 hrs.  Explained to Mom about IP and OP lactation services and encouraged Mom to call for assistance as needed.   Lactation to see prn and 02/07/17  Maternal Data Has patient been taught Hand Expression?: Yes Does the patient have breastfeeding experience prior to this delivery?: No  Feeding Feeding Type: Breast Fed  LATCH Score/Interventions Latch: Grasps breast easily, tongue down, lips flanged, rhythmical sucking.  Audible Swallowing: None Intervention(s): Skin to skin;Hand expression  Type of Nipple: Everted at rest and after stimulation  Comfort (Breast/Nipple): Soft / non-tender     Hold (Positioning): Assistance needed to correctly position infant at breast and maintain latch. Intervention(s): Breastfeeding basics reviewed;Support Pillows;Position options;Skin to skin  LATCH Score: 7  Lactation Tools Discussed/Used     Consult Status Consult Status: Follow-up Date: 02/07/17 Follow-up type: In-patient    Judee Clara 02/06/2017, 4:23 PM

## 2017-02-06 NOTE — MAU Note (Signed)
Pt. Transferred via WC to L&D for delivery.  Report given to day shift nurse.  Stable Patient.

## 2017-02-06 NOTE — Plan of Care (Signed)
Problem: Education: Goal: Knowledge of condition will improve Admission paperwork, safety, and protocols reviewed with patient and significant other. Patient verbalizes understanding.

## 2017-02-06 NOTE — H&P (Signed)
LABOR AND DELIVERY ADMISSION HISTORY AND PHYSICAL NOTE  Heather Riggs is a 26 y.o. female G1P0000 with IUP at [redacted]w[redacted]d by [place] at 7 weeks presenting for SOL. Patient was followed at the health department, with an uncomplicated prenatal course. Patient is varicella non immune and will need postpartum immunization.  She reports positive fetal movement. She denies leakage of fluid or vaginal bleeding.  Prenatal History/Complications:  Past Medical History: Past Medical History:  Diagnosis Date  . Hypertension     Past Surgical History: Past Surgical History:  Procedure Laterality Date  . BLADDER SURGERY     26 years old    Obstetrical History: OB History    Gravida Para Term Preterm AB Living   1 0 0 0 0 0   SAB TAB Ectopic Multiple Live Births   0 0 0 0 0      Social History: Social History   Social History  . Marital status: Single    Spouse name: N/A  . Number of children: N/A  . Years of education: N/A   Social History Main Topics  . Smoking status: Never Smoker  . Smokeless tobacco: Never Used  . Alcohol use No  . Drug use: No  . Sexual activity: Yes    Birth control/ protection: None   Other Topics Concern  . None   Social History Narrative  . None    Family History: History reviewed. No pertinent family history.  Allergies: Allergies  Allergen Reactions  . Penicillins Swelling and Other (See Comments)    Reaction:  Facial swelling  Has patient had a PCN reaction causing immediate rash, facial/tongue/throat swelling, SOB or lightheadedness with hypotension: Yes Has patient had a PCN reaction causing severe rash involving mucus membranes or skin necrosis: No Has patient had a PCN reaction that required hospitalization No Has patient had a PCN reaction occurring within the last 10 years: Yes If all of the above answers are "NO", then may proceed with Cephalosporin use.    Prescriptions Prior to Admission  Medication Sig Dispense Refill Last Dose   . acetaminophen (TYLENOL) 325 MG tablet Take 650 mg by mouth every 6 (six) hours as needed for mild pain, moderate pain or headache.    Past Week at Unknown time  . Prenatal Vit-Fe Fumarate-FA (PRENATAL MULTIVITAMIN) TABS tablet Take 1 tablet by mouth daily at 12 noon.   02/05/2017 at Unknown time  . pantoprazole (PROTONIX) 20 MG tablet Take 1 tablet (20 mg total) by mouth daily. 30 tablet 1 Past Month at Unknown time     Review of Systems   All systems reviewed and negative except as stated in HPI  Blood pressure 121/76, pulse 81, temperature 97.7 F (36.5 C), temperature source Axillary, resp. rate 18, height 6' (1.829 m), weight 232 lb 4 oz (105.3 kg), last menstrual period 05/09/2016, SpO2 97 %. General appearance: alert, cooperative and appears stated age Lungs: Normal respiratory effort, no audible wheezing Heart: regular rate and pulses palpated bilaterally upper and lower extremities Abdomen: soft, non-tender; gravid abdomen appropriate for gestational age Extremities: No calf swelling or tenderness Presentation: cephalic by nurse exam Fetal monitoring: FHR 140, moderate variability, no decels, +accels Uterine activity: regular q3-5 Dilation: 6 Effacement (%): 80 Station: -2 Exam by:: M.Merrill, RN   Prenatal labs: ABO, Rh: --/--/O POS (05/02 2706) Antibody: PENDING (05/02 0705) Rubella: Immune  RPR: Non Reactive (08/26 0958)  HBsAg:  Negative HIV: Non Reactive (08/26 0958)  GBS:  Negative  1 hr Glucola: 86  Genetic screening: Normal Anatomy US: Normal  Prenatal Transfer Tool  Maternal Diabetes: No Genetic Screening: Normal Maternal Ultrasounds/Referrals: Normal Fetal Ultrasounds or other Referrals:  None Maternal Substance Abuse:  No Significant Maternal Medications:  None Significant Maternal Lab Results: None  Results for orders placed or performed during the hospital encounter of 02/06/17 (from the past 24 hour(s))  CBC   Collection Time: 02/06/17  7:05 AM   Result Value Ref Range   WBC 11.8 (H) 4.0 - 10.5 K/uL   RBC 4.10 3.87 - 5.11 MIL/uL   Hemoglobin 11.0 (L) 12.0 - 15.0 g/dL   HCT 55.7 (L) 32.2 - 02.5 %   MCV 79.3 78.0 - 100.0 fL   MCH 26.8 26.0 - 34.0 pg   MCHC 33.8 30.0 - 36.0 g/dL   RDW 42.7 06.2 - 37.6 %   Platelets 273 150 - 400 K/uL  Type and screen [facility]   Collection Time: 02/06/17  7:05 AM  Result Value Ref Range   ABO/RH(D) O POS    Antibody Screen PENDING    Sample Expiration 02/09/2017     Patient Active Problem List   Diagnosis Date Noted  . Labor and delivery, indication for care 02/06/2017    Assessment: Heather Riggs is a 26 y.o. G1P0000 at [redacted]w[redacted]d here for SOL. Patient is currently stable, endorses good fetal movement, no fluid leak or bleeding. Will continue to manage expectantly.  #Labor: Expectant management #Pain:  IV pain meds, epidural upon patient request #FWB: Cat 1 #ID:  GBS neg #MOF: Breast #MOC: IUD, Mirena #Circ:  N/A  Heather Riggs, PGY-1 02/06/2017, 9:00 AM   OB FELLOW HISTORY AND PHYSICAL ATTESTATION  I confirm that I have verified the information documented in the resident's note and that I have also personally reperformed the physical exam and all medical decision making activities.   I did AROM patient at approximately 1030 for thick meconium.      Heather Riggs 02/06/2017, 10:46 AM

## 2017-02-06 NOTE — Anesthesia Preprocedure Evaluation (Signed)
Anesthesia Evaluation  Patient identified by MRN, date of birth, ID band Patient awake    Reviewed: Allergy & Precautions, NPO status , Patient's Chart, lab work & pertinent test results  Airway Mallampati: II  TM Distance: >3 FB Neck ROM: Full    Dental no notable dental hx.    Pulmonary neg pulmonary ROS,    Pulmonary exam normal breath sounds clear to auscultation       Cardiovascular hypertension, negative cardio ROS Normal cardiovascular exam Rhythm:Regular Rate:Normal     Neuro/Psych negative neurological ROS  negative psych ROS   GI/Hepatic negative GI ROS, Neg liver ROS,   Endo/Other  negative endocrine ROS  Renal/GU negative Renal ROS     Musculoskeletal negative musculoskeletal ROS (+)   Abdominal   Peds  Hematology negative hematology ROS (+)   Anesthesia Other Findings   Reproductive/Obstetrics (+) Pregnancy                             Anesthesia Physical Anesthesia Plan  ASA: II  Anesthesia Plan: Epidural   Post-op Pain Management:    Induction:   Airway Management Planned:   Additional Equipment:   Intra-op Plan:   Post-operative Plan:   Informed Consent: I have reviewed the patients History and Physical, chart, labs and discussed the procedure including the risks, benefits and alternatives for the proposed anesthesia with the patient or authorized representative who has indicated his/her understanding and acceptance.     Plan Discussed with:   Anesthesia Plan Comments:         Anesthesia Quick Evaluation

## 2017-02-07 ENCOUNTER — Ambulatory Visit (HOSPITAL_COMMUNITY): Payer: Medicaid Other

## 2017-02-07 ENCOUNTER — Encounter (HOSPITAL_COMMUNITY): Payer: Self-pay

## 2017-02-07 NOTE — Plan of Care (Signed)
Problem: Education: Goal: Knowledge of condition will improve Patient comfortable and ambulating well. Patient feels comfortable with breast feeding.

## 2017-02-07 NOTE — Lactation Note (Signed)
This note was copied from a baby's chart. Lactation Consultation Note  Patient Name: Heather Riggs [id number]'s Date: 02/07/2017 Reason for consult: Follow-up assessment;Infant weight loss (mom was asking about formula see LC note )  LC discussed the early stages of breastfeeding , size for the baby's belly and the importance of allowing the baby to learn mom well to enhance milk supply and the rate of when the milk comes in.  Per mom was shown by the lactation consultant yesterday hand expressing, when asked if she needed a review , mom replied I feel comfortable with it. LC provided colostrum collectors , and encouraged mom to hand express prior to feeding , and in between and save milk to spoon feed post feeding for extra calories. LC stressed to mom according to the baby's doc flow sheets baby is feeding well , voids and stools QS for age.  LC also instructed mom on the use of hand pump , and checked flange #24 good fit for today.  And #27 flange given for when milk comes in.  Mom seemed to me ok with present plan of feeding baby at the breast and hand expressing.    Maternal Data Has patient been taught Hand Expression?: Yes (per mom the lactation consultant  yesterday showed me)  Feeding Feeding Type:  (per mom baby recently breast fed ) Length of feed: 10 min (per mom )  LATCH Score/Interventions Latch: Grasps breast easily, tongue down, lips flanged, rhythmical sucking.  Audible Swallowing: A few with stimulation  Type of Nipple: Everted at rest and after stimulation  Comfort (Breast/Nipple): Soft / non-tender     Hold (Positioning): No assistance needed to correctly position infant at breast. Intervention(s): Breastfeeding basics reviewed  LATCH Score: 9  Lactation Tools Discussed/Used Tools: Pump;Flanges (LC instructed mom on the hand pump. 42#24 F ok for today, ) Flange Size: 27 (for when the milk comes in . mom aware ) Breast pump type: Manual   Consult  Status Consult Status: Follow-up (per mom feels comfortable with latching, enc to call if need) Date: 02/08/17 Follow-up type: In-patient    Heather Riggs 02/07/2017, 12:51 PM

## 2017-02-07 NOTE — Anesthesia Postprocedure Evaluation (Signed)
Anesthesia Post Note  Patient: [name]  Procedure(s) Performed: * No procedures listed *  Patient location during evaluation: Mother Baby Anesthesia Type: Epidural Level of consciousness: awake and alert and oriented Pain management: pain level controlled Vital Signs Assessment: post-procedure vital signs reviewed and stable Respiratory status: spontaneous breathing and nonlabored ventilation Cardiovascular status: stable Postop Assessment: no headache, epidural receding, patient able to bend at knees, adequate PO intake, no backache and no signs of nausea or vomiting Anesthetic complications: no        Last Vitals:  Vitals:   02/06/17 2231 02/07/17 0604  BP: 106/68 109/67  Pulse: 97 84  Resp: 18 18  Temp: 36.7 C 36.6 C    Last Pain:  Vitals:   02/07/17 0604  TempSrc: Oral  PainSc:    Pain Goal:                 Laban EmperorMalinova,Aulani Shipton Hristova

## 2017-02-07 NOTE — Progress Notes (Signed)
Post Partum Day #1 Subjective: no complaints, up ad lib, voiding and tolerating PO  Objective: Blood pressure 109/67, pulse 84, temperature 97.8 F (36.6 C), temperature source Oral, resp. rate 18, height 6' (1.829 m), weight 105.3 kg (232 lb 4 oz), last menstrual period 05/09/2016, SpO2 99 %.  Physical Exam:  General: alert Lochia: appropriate Uterine Fundus: firm DVT Evaluation: No evidence of DVT seen on physical exam.   Recent Labs  02/06/17 0705  HGB 11.0*  HCT 32.5*    Assessment/Plan: Plan for discharge tomorrow  Uncertain about contraception Breast feeding   LOS: 1 day   Heather Riggs C Vilas Edgerly 02/07/2017, 9:10 AM

## 2017-02-07 NOTE — Progress Notes (Signed)
UR chart review completed.  

## 2017-02-08 MED ORDER — IBUPROFEN 600 MG PO TABS: 600.0000 mg | ORAL_TABLET | Freq: Four times a day (QID) | ORAL | 0 refills | Status: DC

## 2017-02-08 NOTE — Discharge Summary (Signed)
Attestation of Attending Supervision of Advanced Practice Provider (PA/CNM/NP): Evaluation and management procedures were performed by the Advanced Practice Provider under my supervision and collaboration.  I have reviewed the Advanced Practice Provider's note and chart, and I agree with the management and plan.  Tobie Perdue L. Alysia PennaErvin, MD, FACOG Attending Obstetrician & Gynecologist Faculty Practice, [facility] [place]    OB Discharge Summary     Patient Name: Heather Riggs DOB: 1990-12-21 MRN: [id number]  Date of admission: 02/06/2017 Delivering MD: Lorne SkeensSCHENK, NICHOLAS Topanga Alvelo   Date of discharge: 02/08/2017  Admitting diagnosis: 40 WEEKS CTX Intrauterine pregnancy: 2476w3d     Secondary diagnosis:  Active Problems:   Labor and delivery, indication for care  Additional problems: none     Discharge diagnosis: Term Pregnancy Delivered                                                                                                Post partum procedures:none  Augmentation: none  Complications: None  Hospital course:  Onset of Labor With Vaginal Delivery     26 y.o. yo G1P0000 at 6776w3d was admitted in Active Labor on 02/06/2017. Patient had an uncomplicated labor course as follows:  Membrane Rupture Time/Date: 10:37 AM ,02/06/2017   Intrapartum Procedures: Episiotomy: None [1]                                         Lacerations:  Labial [10]  Patient had a delivery of a Viable infant. 02/06/2017  Information for the patient's newborn:  Heather Riggs, Heather Riggs [[id number]]  Delivery Method: Vaginal, Spontaneous Delivery (Filed from Delivery Summary)    Pateint had an uncomplicated postpartum course.  She is ambulating, tolerating a regular diet, passing flatus, and urinating well. Patient is discharged home in stable condition on 02/08/17.   Physical exam  Vitals:   02/06/17 2231 02/07/17 0604 02/07/17 1820 02/08/17 0549  BP: 106/68 109/67 112/62 116/74  Pulse: 97 84 94 79   Resp: 18 18 18 17   Temp: 98.1 F (36.7 C) 97.8 F (36.6 C) 98.6 F (37 C) 98.1 F (36.7 C)  TempSrc: Oral Oral Oral Oral  SpO2:      Weight:      Height:       General: alert, cooperative and no distress Lochia: appropriate Uterine Fundus: firm Incision: N/A DVT Evaluation: No evidence of DVT seen on physical exam. Labs: Lab Results  Component Value Date   WBC 11.8 (H) 02/06/2017   HGB 11.0 (L) 02/06/2017   HCT 32.5 (L) 02/06/2017   MCV 79.3 02/06/2017   PLT 273 02/06/2017   CMP Latest Ref Rng & Units 10/30/2016  Glucose 65 - 99 mg/dL 75  BUN 6 - 20 mg/dL 5(L)  Creatinine 5.620.44 - 1.00 mg/dL 1.300.65  Sodium 865135 - 784145 mmol/L 136  Potassium 3.5 - 5.1 mmol/L 3.7  Chloride 101 - 111 mmol/L 106  CO2 22 - 32 mmol/L 21(L)  Calcium 8.9 - 10.3 mg/dL 9.3  Total Protein 6.5 -  8.1 g/dL 6.7  Total Bilirubin 0.3 - 1.2 mg/dL 0.6  Alkaline Phos 38 - 126 U/L 59  AST 15 - 41 U/L 33  ALT 14 - 54 U/L 33    Discharge instruction: per After Visit Summary and "Baby and Me Booklet".  After visit meds:  Allergies as of 02/08/2017      Reactions   Penicillins Swelling, Other (See Comments)   Reaction:  Facial swelling  Has patient had a PCN reaction causing immediate rash, facial/tongue/throat swelling, SOB or lightheadedness with hypotension: Yes Has patient had a PCN reaction causing severe rash involving mucus membranes or skin necrosis: No Has patient had a PCN reaction that required hospitalization No Has patient had a PCN reaction occurring within the last 10 years: Yes If all of the above answers are "NO", then may proceed with Cephalosporin use.      Medication List    TAKE these medications   acetaminophen 325 MG tablet Commonly known as:  TYLENOL Take 650 mg by mouth every 6 (six) hours as needed for mild pain, moderate pain or headache.   ibuprofen 600 MG tablet Commonly known as:  ADVIL,MOTRIN Take 1 tablet (600 mg total) by mouth every 6 (six) hours.   prenatal  multivitamin Tabs tablet Take 1 tablet by mouth daily at 12 noon.       Diet: routine diet  Activity: Advance as tolerated. Pelvic rest for 6 weeks.   Outpatient follow up:6 weeks Follow up Appt:No future appointments. Follow up Visit:No Follow-up on file.  Postpartum contraception: IUD Mirena  Newborn Data: Live born female  Birth Weight: 7 lb 6 oz (3345 g) APGAR: 9, 9  Baby Feeding: Breast Disposition:home with mother   02/08/2017 Wynelle Bourgeois, CNM

## 2017-02-08 NOTE — Lactation Note (Signed)
This note was copied from a baby's chart. Lactation Consultation Note: Mother reports that she just finished a 25 min. Feeding. Mother reports that she hears infant swallow. Discussed milk coming to volume . Mother advised in treatment to prevern severe engorgement. Mother was given a harmony hand pump by staff member. Mother advised to hand express or pre pump with hand pump if having a difficult latch. Reviewed S/S of Mastitis . Suggested that mother keep good records of infants wet and dirty diapers. Advised to continue to cue base feed and allow for cluster feeding. Mother to feed infant 8-12 times in 24 hours. Mother informed of all LC services and community support. [facility] questions or concerns. Mother receptive to all teaching.   Patient Name: Heather Riggs ZOXWR'UToday's Date: 02/08/2017 Reason for consult: Follow-up assessment   Maternal Data    Feeding Feeding Type: Breast Fed Length of feed: 25 min  LATCH Score/Interventions                      Lactation Tools Discussed/Used     Consult Status Consult Status: Complete    Michel BickersKendrick, Giann Obara McCoy 02/08/2017, 10:01 AM

## 2017-02-08 NOTE — Discharge Instructions (Signed)

## 2017-06-12 ENCOUNTER — Emergency Department (HOSPITAL_COMMUNITY)
Admission: EM | Admit: 2017-06-12 | Discharge: 2017-06-12 | Disposition: A | Discharge status: Home / Self Care | Payer: Self-pay | Attending: Emergency Medicine | Admitting: Emergency Medicine

## 2017-06-12 DIAGNOSIS — R51 Headache: Secondary | ICD-10-CM | POA: Insufficient documentation

## 2017-06-12 DIAGNOSIS — I10 Essential (primary) hypertension: Secondary | ICD-10-CM | POA: Insufficient documentation

## 2017-06-12 DIAGNOSIS — R519 Headache, unspecified: Secondary | ICD-10-CM

## 2017-06-12 MED ORDER — KETOROLAC TROMETHAMINE 30 MG/ML IJ SOLN: 30.0000 mg | Freq: Once | INTRAMUSCULAR | Status: DC

## 2017-06-12 MED ORDER — SODIUM CHLORIDE 0.9 % IV BOLUS (SEPSIS): 1000.0000 mL | Freq: Once | INTRAVENOUS | Status: DC

## 2017-06-12 MED ORDER — DEXAMETHASONE SODIUM PHOSPHATE 10 MG/ML IJ SOLN: 10.0000 mg | Freq: Once | INTRAMUSCULAR | Status: DC

## 2017-06-12 MED ORDER — ACETAMINOPHEN 500 MG PO TABS: 1000.0000 mg | ORAL_TABLET | Freq: Once | ORAL | Status: DC

## 2017-06-12 MED ORDER — PROCHLORPERAZINE EDISYLATE 5 MG/ML IJ SOLN: 10.0000 mg | Freq: Once | INTRAMUSCULAR | Status: DC

## 2017-06-12 NOTE — ED Notes (Signed)
This RN entered pt rom to assess and insert iv for medication and pt was not in room. Pt labels and BP cuff on bed. PA made aware and pt determined LWBS,

## 2017-06-12 NOTE — ED Provider Notes (Signed)
[facility]-EMERGENCY DEPT Provider Note   CSN: [id number] Arrival date & time: 06/12/17  1516     History   Chief Complaint Chief Complaint  Patient presents with  . Headache    HPI Heather Riggs is a 26 y.o. female with history of migraines presents to ED for evaluation of gradually worsening left-sided headache associated with flashes of light and photophobia 2 days. Headache was gradual in onset. States she gets migraines at least twice a month, her symptoms today are similar to her previous migraines however she is seeing more like than usual today. Has tried Tylenol without relief. Denies fevers, vision loss, slurred speech, neck stiffness or pain, numbness or weakness to extremities, light-headedness, seizures, syncope.  HPI  Past Medical History:  Diagnosis Date  . Hypertension     Patient Active Problem List   Diagnosis Date Noted  . Labor and delivery, indication for care 02/06/2017    Past Surgical History:  Procedure Laterality Date  . BLADDER SURGERY     26 years old    OB History    Gravida Para Term Preterm AB Living   1 0 0 0 0 0   SAB TAB Ectopic Multiple Live Births   0 0 0 0 0       Home Medications    Prior to Admission medications   Medication Sig Start Date End Date Taking? Authorizing Provider  acetaminophen (TYLENOL) 325 MG tablet Take 650 mg by mouth every 6 (six) hours as needed for mild pain, moderate pain or headache.     [provider]  ibuprofen (ADVIL,MOTRIN) 600 MG tablet Take 1 tablet (600 mg total) by mouth every 6 (six) hours. 02/08/17   Aviva SignsWilliams, Marie L, CNM  Prenatal Vit-Fe Fumarate-FA (PRENATAL MULTIVITAMIN) TABS tablet Take 1 tablet by mouth daily at 12 noon.    [provider]    Family History No family history on file.  Social History Social History  Substance Use Topics  . Smoking status: Never Smoker  . Smokeless tobacco: Never Used  . Alcohol use No     Allergies   Penicillins   Review of  Systems Review of Systems  Constitutional: Negative for chills, diaphoresis and fever.  HENT: Negative for congestion and sinus pain.   Eyes: Positive for visual disturbance. Negative for pain.  Respiratory: Negative for cough and shortness of breath.   Gastrointestinal: Negative for abdominal pain, nausea and vomiting.  Musculoskeletal: Negative for neck pain and neck stiffness.  Neurological: Positive for headaches. Negative for seizures, syncope, speech difficulty, weakness, light-headedness and numbness.     Physical Exam Updated Vital Signs BP 120/71 (BP Location: Left Arm)   Pulse 73   Temp 98.1 F (36.7 C) (Oral)   Resp 18   Ht 6' (1.829 m)   Wt 93 kg (205 lb)   LMP 05/09/2016 Comment: only 2 days long  SpO2 100%   BMI 27.80 kg/m   Physical Exam  Constitutional: She is oriented to person, place, and time. She appears well-developed and well-nourished. No distress.  NAD.  HENT:  Head: Normocephalic and atraumatic.  Right Ear: External ear normal.  Left Ear: External ear normal.  Nose: Nose normal.  Head: Skull and facial bones symmetric, non-tender without bony abnormalities. Frontal and maxillary sinuses are non-tender to percussion. Eyes: Lids symmetrical without lag or palpable mass. Sclera white without prominent vessels. Conjunctiva pink. PERRL and EOMs intact bilaterally.   Eyes: No scleral icterus.  Neck: Normal range of motion. Neck  supple.  Neck is supple. Painless PROM  Cardiovascular: Normal rate, regular rhythm and normal heart sounds.   No murmur heard. Pulmonary/Chest: Effort normal and breath sounds normal. She has no wheezes.  Musculoskeletal: Normal range of motion. She exhibits no deformity.  Neurological: She is alert and oriented to person, place, and time.  A&O to self, place and time. Speech and phonation normal.  Thought process coherent.   Strength 5/5 in upper and lower extremities.   Sensation to light touch intact in upper and lower  extremities.  Gait normal.    CN I not tested CN II full visual fields  CN III, IV, VI PEERL and EOMs intact bilaterally CN V light touch intact in all 3 divisions of trigeminal nerve CN VII facial nerve movements intact, symmetric, bilaterally CN VIII hearing intact to finger rub, bilaterally CN IX, X no uvula deviation, symmetric soft palate rise CN XI 5/5 SCM and trapezius strength bilaterally  CN XII Tongue midline with symmetric L/R movement  Skin: Skin is warm and dry. Capillary refill takes less than 2 seconds.  Psychiatric: She has a normal mood and affect. Her behavior is normal. Judgment and thought content normal.  Nursing note and vitals reviewed.    ED Treatments / Results  Labs (all labs ordered are listed, but only abnormal results are displayed) Labs Reviewed  HCG, SERUM, QUALITATIVE    EKG  EKG Interpretation None       Radiology No results found.  Procedures Procedures (including critical care time)  Medications Ordered in ED Medications  sodium chloride 0.9 % bolus 1,000 mL (not administered)  prochlorperazine (COMPAZINE) injection 10 mg (not administered)  dexamethasone (DECADRON) injection 10 mg (not administered)  ketorolac (TORADOL) 30 MG/ML injection 30 mg (not administered)  acetaminophen (TYLENOL) tablet 1,000 mg (not administered)     Initial Impression / Assessment and Plan / ED Course  I have reviewed the triage vital signs and the nursing notes.  Pertinent labs & imaging results that were available during my care of the patient were reviewed by me and considered in my medical decision making (see chart for details).    I evaluated the patient. Exam and history were reassuring. She has history of migraines that presents similarly. No red flag symptoms. I discussed with patient plan to give her IV migraine cocktail and reassess. Patient left prior to meds, eloped.  Final Clinical Impressions(s) / ED Diagnoses   Final diagnoses:    Bad headache    New Prescriptions New Prescriptions   No medications on file     Jerrell Mylar 06/12/17 1743    Tegeler, Canary Brim, MD 06/26/17 1515

## 2017-06-12 NOTE — ED Triage Notes (Signed)
Pt c/o HA onset x 3 days & was seen x3-4 mths ago for similar symptoms, pt states, " I see spots in my left eye." no facial droop or slurred speech, denies n/v, c/o x 6 diarrhea episodes in the last 24 hrs, pt reports sensitivity to light, A&O x4

## 2017-06-20 ENCOUNTER — Encounter (HOSPITAL_COMMUNITY): Payer: Self-pay

## 2017-07-27 ENCOUNTER — Emergency Department (HOSPITAL_COMMUNITY)
Admission: EM | Admit: 2017-07-27 | Discharge: 2017-07-27 | Disposition: A | Discharge status: Home / Self Care | Payer: Self-pay | Attending: Emergency Medicine | Admitting: Emergency Medicine

## 2017-07-27 ENCOUNTER — Emergency Department (HOSPITAL_BASED_OUTPATIENT_CLINIC_OR_DEPARTMENT_OTHER)
Admit: 2017-07-27 | Discharge: 2017-07-27 | Disposition: A | Discharge status: Home / Self Care | Payer: Self-pay | Attending: Emergency Medicine | Admitting: Emergency Medicine

## 2017-07-27 ENCOUNTER — Encounter (HOSPITAL_COMMUNITY): Payer: Self-pay | Admitting: Emergency Medicine

## 2017-07-27 DIAGNOSIS — S76911A Strain of unspecified muscles, fascia and tendons at thigh level, right thigh, initial encounter: Secondary | ICD-10-CM | POA: Insufficient documentation

## 2017-07-27 DIAGNOSIS — Y929 Unspecified place or not applicable: Secondary | ICD-10-CM | POA: Insufficient documentation

## 2017-07-27 DIAGNOSIS — X58XXXA Exposure to other specified factors, initial encounter: Secondary | ICD-10-CM | POA: Insufficient documentation

## 2017-07-27 DIAGNOSIS — M79604 Pain in right leg: Secondary | ICD-10-CM | POA: Insufficient documentation

## 2017-07-27 DIAGNOSIS — M79609 Pain in unspecified limb: Secondary | ICD-10-CM

## 2017-07-27 DIAGNOSIS — I10 Essential (primary) hypertension: Secondary | ICD-10-CM | POA: Insufficient documentation

## 2017-07-27 DIAGNOSIS — Z79899 Other long term (current) drug therapy: Secondary | ICD-10-CM | POA: Insufficient documentation

## 2017-07-27 DIAGNOSIS — Y999 Unspecified external cause status: Secondary | ICD-10-CM | POA: Insufficient documentation

## 2017-07-27 DIAGNOSIS — Y939 Activity, unspecified: Secondary | ICD-10-CM | POA: Insufficient documentation

## 2017-07-27 MED ORDER — IBUPROFEN 800 MG PO TABS
800.0000 mg | ORAL_TABLET | Freq: Once | ORAL | Status: AC
  Administered 2017-07-27: 800 mg via ORAL
  Filled 2017-07-27: qty 1

## 2017-07-27 NOTE — ED Triage Notes (Signed)
Patient c/o upper left thigh pain onset of Thursday. Pain worsened since then. Attempted stretching with no relief. Denies any injury or trauma. States pain increases when standing and walking.

## 2017-07-27 NOTE — Progress Notes (Signed)
*  PRELIMINARY RESULTS* Vascular Ultrasound Left lower ext venous duplex has been completed.  Preliminary findings: No evidence of DVT or baker's cyst.   Farrel DemarkJill Eunice, RDMS, RVT  07/27/2017, 10:43 AM

## 2017-07-27 NOTE — ED Notes (Signed)
Pt c/o sharp, stabbing pain to the left anterior upper leg onset Thursday not associated with injury.

## 2017-07-27 NOTE — ED Provider Notes (Signed)
[facility]-EMERGENCY DEPT Provider Note   CSN: [id number] Arrival date & time: 07/27/17  [id number]     History   Chief Complaint Chief Complaint  Patient presents with  . Leg Pain    HPI Heather Riggs is a 26 y.o. female hx HTN, presented with left thigh pain for the last 2 days.  Patient states that she has left thigh pain may be some swelling of the thigh for the last 2 days.  Denies any fall or injury and denies any numbness or weakness or incontinence or back pain.  Patient states that she is currently on oral contraceptive pills and she was concerned that she may have a blood clot in the left leg.  She denies any recent travel or history of blood clots or any chest pain or shortness of breath.   The history is provided by the patient.    Past Medical History:  Diagnosis Date  . Hypertension     Patient Active Problem List   Diagnosis Date Noted  . Labor and delivery, indication for care 02/06/2017    Past Surgical History:  Procedure Laterality Date  . BLADDER SURGERY     26 years old    OB History    Gravida Para Term Preterm AB Living   1 0 0 0 0 0   SAB TAB Ectopic Multiple Live Births   0 0 0 0 0       Home Medications    Prior to Admission medications   Medication Sig Start Date End Date Taking? Authorizing Provider  acetaminophen (TYLENOL) 325 MG tablet Take 650 mg by mouth every 6 (six) hours as needed for mild pain, moderate pain or headache.     [provider]  ibuprofen (ADVIL,MOTRIN) 600 MG tablet Take 1 tablet (600 mg total) by mouth every 6 (six) hours. 02/08/17   Aviva SignsWilliams, Marie L, CNM  Prenatal Vit-Fe Fumarate-FA (PRENATAL MULTIVITAMIN) TABS tablet Take 1 tablet by mouth daily at 12 noon.    [provider]    Family History No family history on file.  Social History Social History  Substance Use Topics  . Smoking status: Never Smoker  . Smokeless tobacco: Never Used  . Alcohol use No      Allergies   Penicillins   Review of Systems Review of Systems  Musculoskeletal:       L thigh pain   All other systems reviewed and are negative.    Physical Exam Updated Vital Signs BP 119/76 (BP Location: Left Arm)   Pulse 88   Temp 98.5 F (36.9 C) (Oral)   Resp 18   Ht 6' (1.829 m)   Wt 93 kg (205 lb)   LMP 07/21/2017   SpO2 93%   BMI 27.80 kg/m   Physical Exam  Constitutional: She is oriented to person, place, and time. She appears well-developed.  HENT:  Head: Normocephalic.  Eyes: Pupils are equal, round, and reactive to light.  Neck: Normal range of motion.  Cardiovascular: Normal rate.   Pulmonary/Chest: Effort normal.  Abdominal: Soft.  Musculoskeletal:  Mild tenderness medial aspect L thigh, no obvious deformity. Nl ROM L hip. 2+ femoral and popliteal pulses. No saddle anesthesia.   Neurological: She is alert and oriented to person, place, and time.  Skin: Skin is warm.  Psychiatric: She has a normal mood and affect.  Nursing note and vitals reviewed.    ED Treatments / Results  Labs (all labs ordered are listed, but  only abnormal results are displayed) Labs Reviewed - No data to display  EKG  EKG Interpretation None       Radiology No results found.  Procedures Procedures (including critical care time)  Medications Ordered in ED Medications  ibuprofen (ADVIL,MOTRIN) tablet 800 mg (not administered)     Initial Impression / Assessment and Plan / ED Course  I have reviewed the triage vital signs and the nursing notes.  Pertinent labs & imaging results that were available during my care of the patient were reviewed by me and considered in my medical decision making (see chart for details).     Heather Riggs is a 26 y.o. female here with L thigh pain. Likely muscle strain. She is on OCP and was concerned for possible DVT so will get DVT study, give motrin for pain.   10:58 AM DVT study negative. Will dc home with  motrin, tylenol.   Final Clinical Impressions(s) / ED Diagnoses   Final diagnoses:  None    New Prescriptions New Prescriptions   No medications on file     Charlynne Pander, MD 07/27/17 1058

## 2017-07-27 NOTE — Discharge Instructions (Signed)
Take tylenol, motrin for thigh pain.   See your doctor  Return to ER if you have worse thigh pain, leg swelling, back pain, numbness, weakness, chest pain, trouble breathing

## 2017-12-07 ENCOUNTER — Emergency Department (HOSPITAL_COMMUNITY)
Admission: EM | Admit: 2017-12-07 | Discharge: 2017-12-07 | Disposition: A | Discharge status: Home / Self Care | Payer: No Typology Code available for payment source | Attending: Emergency Medicine | Admitting: Emergency Medicine

## 2017-12-07 ENCOUNTER — Emergency Department (HOSPITAL_COMMUNITY): Payer: No Typology Code available for payment source

## 2017-12-07 ENCOUNTER — Encounter (HOSPITAL_COMMUNITY): Payer: Self-pay | Admitting: Emergency Medicine

## 2017-12-07 DIAGNOSIS — Y929 Unspecified place or not applicable: Secondary | ICD-10-CM | POA: Insufficient documentation

## 2017-12-07 DIAGNOSIS — Y999 Unspecified external cause status: Secondary | ICD-10-CM | POA: Insufficient documentation

## 2017-12-07 DIAGNOSIS — Z23 Encounter for immunization: Secondary | ICD-10-CM | POA: Insufficient documentation

## 2017-12-07 DIAGNOSIS — S30811A Abrasion of abdominal wall, initial encounter: Secondary | ICD-10-CM | POA: Diagnosis not present

## 2017-12-07 DIAGNOSIS — S50812A Abrasion of left forearm, initial encounter: Secondary | ICD-10-CM | POA: Insufficient documentation

## 2017-12-07 DIAGNOSIS — M542 Cervicalgia: Secondary | ICD-10-CM | POA: Insufficient documentation

## 2017-12-07 DIAGNOSIS — Y939 Activity, unspecified: Secondary | ICD-10-CM | POA: Insufficient documentation

## 2017-12-07 DIAGNOSIS — R10819 Abdominal tenderness, unspecified site: Secondary | ICD-10-CM | POA: Diagnosis not present

## 2017-12-07 DIAGNOSIS — S20319A Abrasion of unspecified front wall of thorax, initial encounter: Secondary | ICD-10-CM | POA: Insufficient documentation

## 2017-12-07 DIAGNOSIS — R51 Headache: Secondary | ICD-10-CM | POA: Insufficient documentation

## 2017-12-07 DIAGNOSIS — M79602 Pain in left arm: Secondary | ICD-10-CM | POA: Insufficient documentation

## 2017-12-07 DIAGNOSIS — Z041 Encounter for examination and observation following transport accident: Secondary | ICD-10-CM | POA: Diagnosis present

## 2017-12-07 LAB — CBC
HCT: 42.6 % (ref 36.0–46.0)
Hemoglobin: 12.5 g/dL (ref 12.0–15.0)
MCH: 25.8 pg — ABNORMAL LOW (ref 26.0–34.0)
MCHC: 29.3 g/dL — ABNORMAL LOW (ref 30.0–36.0)
MCV: 87.8 fL (ref 78.0–100.0)
Platelets: 329 10*3/uL (ref 150–400)
RBC: 4.85 MIL/uL (ref 3.87–5.11)
RDW: 15.3 % (ref 11.5–15.5)
WBC: 10.7 10*3/uL — ABNORMAL HIGH (ref 4.0–10.5)

## 2017-12-07 LAB — I-STAT CHEM 8, ED
BUN: 9 mg/dL (ref 6–20)
Calcium, Ion: 1.2 mmol/L (ref 1.15–1.40)
Chloride: 108 mmol/L (ref 101–111)
Creatinine, Ser: 0.7 mg/dL (ref 0.44–1.00)
Glucose, Bld: 85 mg/dL (ref 65–99)
HCT: 39 % (ref 36.0–46.0)
Hemoglobin: 13.3 g/dL (ref 12.0–15.0)
Potassium: 3.7 mmol/L (ref 3.5–5.1)
Sodium: 142 mmol/L (ref 135–145)
TCO2: 23 mmol/L (ref 22–32)

## 2017-12-07 LAB — I-STAT BETA HCG BLOOD, ED (MC, WL, AP ONLY): I-stat hCG, quantitative: <5 m[IU]/mL (ref <5)

## 2017-12-07 MED ORDER — TETANUS-DIPHTH-ACELL PERTUSSIS 5-2.5-18.5 LF-MCG/0.5 IM SUSP
0.5000 mL | Freq: Once | INTRAMUSCULAR | Status: AC
  Administered 2017-12-07: 0.5 mL via INTRAMUSCULAR
  Filled 2017-12-07: qty 0.5

## 2017-12-07 MED ORDER — IOPAMIDOL (ISOVUE-300) INJECTION 61%
INTRAVENOUS | Status: AC
  Administered 2017-12-07: 100 mL
  Filled 2017-12-07: qty 100

## 2017-12-07 MED ORDER — IBUPROFEN 600 MG PO TABS: 600.0000 mg | ORAL_TABLET | Freq: Four times a day (QID) | ORAL | 0 refills | Status: DC | PRN

## 2017-12-07 MED ORDER — ACETAMINOPHEN 325 MG PO TABS
650.0000 mg | ORAL_TABLET | Freq: Once | ORAL | Status: AC
Start: 2017-12-07 — End: 2017-12-07
  Administered 2017-12-07: 650 mg via ORAL
  Filled 2017-12-07: qty 2

## 2017-12-07 MED ORDER — METHOCARBAMOL 500 MG PO TABS: 500.0000 mg | ORAL_TABLET | Freq: Two times a day (BID) | ORAL | 0 refills | Status: DC

## 2017-12-07 MED ORDER — TETANUS-DIPHTH-ACELL PERTUSSIS 5-2.5-18.5 LF-MCG/0.5 IM SUSP: 0.5000 mL | Freq: Once | INTRAMUSCULAR | Status: DC

## 2017-12-07 MED ORDER — SODIUM CHLORIDE 0.9 % IJ SOLN
INTRAMUSCULAR | Status: AC
  Filled 2017-12-07: qty 50

## 2017-12-07 MED ORDER — METHOCARBAMOL 500 MG PO TABS
500.0000 mg | ORAL_TABLET | Freq: Once | ORAL | Status: AC
Start: 2017-12-07 — End: 2017-12-07
  Administered 2017-12-07: 500 mg via ORAL
  Filled 2017-12-07: qty 1

## 2017-12-07 MED ORDER — KETOROLAC TROMETHAMINE 30 MG/ML IJ SOLN
30.0000 mg | Freq: Once | INTRAMUSCULAR | Status: AC
  Administered 2017-12-07: 30 mg via INTRAVENOUS
  Filled 2017-12-07: qty 1
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
## 2017-12-07 NOTE — ED Notes (Signed)
Bed: WTR9 Expected date:  Expected time:  Means of arrival:  Comments: 
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
## 2017-12-07 NOTE — ED Notes (Signed)
Patient transported to CT 
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
## 2018-02-24 ENCOUNTER — Ambulatory Visit (HOSPITAL_COMMUNITY): Admission: EM | Admit: 2018-02-24 | Discharge: 2018-02-24 | Discharge status: Against Medical Advice | Payer: Self-pay

## 2018-02-24 NOTE — ED Triage Notes (Signed)
Pt called x 3 with no answer

## 2018-02-26 ENCOUNTER — Other Ambulatory Visit: Payer: Self-pay

## 2018-02-26 ENCOUNTER — Encounter (HOSPITAL_COMMUNITY): Payer: Self-pay | Admitting: Emergency Medicine

## 2018-02-26 ENCOUNTER — Emergency Department (HOSPITAL_COMMUNITY)
Admission: EM | Admit: 2018-02-26 | Discharge: 2018-02-26 | Disposition: A | Discharge status: Home / Self Care | Payer: Self-pay | Attending: Emergency Medicine | Admitting: Emergency Medicine

## 2018-02-26 DIAGNOSIS — R059 Cough, unspecified: Secondary | ICD-10-CM

## 2018-02-26 DIAGNOSIS — Z79899 Other long term (current) drug therapy: Secondary | ICD-10-CM | POA: Insufficient documentation

## 2018-02-26 DIAGNOSIS — I10 Essential (primary) hypertension: Secondary | ICD-10-CM | POA: Insufficient documentation

## 2018-02-26 DIAGNOSIS — R05 Cough: Secondary | ICD-10-CM | POA: Insufficient documentation

## 2018-02-26 DIAGNOSIS — J029 Acute pharyngitis, unspecified: Secondary | ICD-10-CM | POA: Insufficient documentation

## 2018-02-26 LAB — GROUP A STREP BY PCR: Group A Strep by PCR: NOT DETECTED

## 2018-02-26 MED ORDER — BENZONATATE 100 MG PO CAPS: 100.0000 mg | ORAL_CAPSULE | Freq: Three times a day (TID) | ORAL | 0 refills | Status: DC

## 2018-02-26 NOTE — ED Triage Notes (Signed)
Pt states 4 days of sore throat with pain with swallowing. Afebrile.

## 2018-02-26 NOTE — Discharge Instructions (Signed)
Try Tessalon for cough Drink cool liquids to soothe throat and cough drops Return if worsening

## 2018-02-26 NOTE — ED Provider Notes (Signed)
[facility] EMERGENCY DEPARTMENT Provider Note   CSN: [id number] Arrival date & time: 02/26/18  0935     History   Chief Complaint Chief Complaint  Patient presents with  . Sore Throat    HPI Heather Riggs is a 27 y.o. female who presents with a sore throat.  She states that she has had a dry cough and a sore throat for the past 4 days.  Symptoms started with a dry cough.  She reports some painful swallowing and difficulty swallowing at night due to mild swelling.  She is been taking ibuprofen for pain.  She denies fever, ear pain, congestion or runny nose, inability to swallow, chest pain or shortness of breath.  She denies any sick contacts.  She does have a 70-year-old who goes to daycare but reports that she has been well.  HPI  Past Medical History:  Diagnosis Date  . Hypertension     Patient Active Problem List   Diagnosis Date Noted  . Labor and delivery, indication for care 02/06/2017    Past Surgical History:  Procedure Laterality Date  . BLADDER SURGERY     27 years old     OB History    Gravida  1   Para  0   Term  0   Preterm  0   AB  0   Living  0     SAB  0   TAB  0   Ectopic  0   Multiple  0   Live Births  0            Home Medications    Prior to Admission medications   Medication Sig Start Date End Date Taking? Authorizing Provider  ibuprofen (ADVIL,MOTRIN) 600 MG tablet Take 1 tablet (600 mg total) by mouth every 6 (six) hours as needed. 12/07/17   Aviva Kluver B, PA-C  methocarbamol (ROBAXIN) 500 MG tablet Take 1 tablet (500 mg total) by mouth 2 (two) times daily. 12/07/17   Aviva Kluver B, PA-C  norgestimate-ethinyl estradiol (SPRINTEC 28) 0.25-35 MG-MCG tablet Take 1 tablet by mouth daily.    [provider]    Family History No family history on file.  Social History Social History   Tobacco Use  . Smoking status: Never Smoker  . Smokeless tobacco: Never Used  Substance Use Topics    . Alcohol use: No  . Drug use: No     Allergies   Penicillins   Review of Systems Review of Systems  Constitutional: Negative for fever.  HENT: Positive for sore throat. Negative for congestion, ear pain and rhinorrhea.   Respiratory: Positive for cough. Negative for shortness of breath.      Physical Exam Updated Vital Signs BP 114/76   Pulse 81   Temp 98.6 F (37 C)   Resp 18   LMP 01/27/2018   SpO2 99%   Physical Exam  Constitutional: She is oriented to person, place, and time. She appears well-developed and well-nourished. No distress.  Calm and cooperative.  Well-appearing  HENT:  Head: Normocephalic and atraumatic.  Right Ear: Tympanic membrane normal.  Left Ear: Tympanic membrane normal.  Nose: Nose normal.  Mouth/Throat: Uvula is midline, oropharynx is clear and moist and mucous membranes are normal. Mucous membranes are not pale.  Eyes: Pupils are equal, round, and reactive to light. Conjunctivae are normal. Right eye exhibits no discharge. Left eye exhibits no discharge. No scleral icterus.  Neck: Normal range of motion.  Cardiovascular:  Normal rate and regular rhythm.  Pulmonary/Chest: Effort normal and breath sounds normal. No respiratory distress.  Abdominal: She exhibits no distension.  Neurological: She is alert and oriented to person, place, and time.  Skin: Skin is warm and dry.  Psychiatric: She has a normal mood and affect. Her behavior is normal.  Nursing note and vitals reviewed.    ED Treatments / Results  Labs (all labs ordered are listed, but only abnormal results are displayed) Labs Reviewed  RAPID STREP SCREEN ([facility] ONLY)    EKG None  Radiology No results found.  Procedures Procedures (including critical care time)  Medications Ordered in ED Medications - No data to display   Initial Impression / Assessment and Plan / ED Course  I have reviewed the triage vital signs and the nursing notes.  Pertinent labs &  imaging results that were available during my care of the patient were reviewed by me and considered in my medical decision making (see chart for details).  27 year old female with sore throat and dry cough for 4 days.  She is well-appearing.  Her vital signs are normal.  Her exam is unremarkable.  Her strep test was negative.  She is advised to continue supportive care.  Prescription for Tessalon was given.  Final Clinical Impressions(s) / ED Diagnoses   Final diagnoses:  Sore throat  Cough    ED Discharge Orders    None       Bethel Born, PA-C 02/26/18 1151    Arby Barrette, MD 03/02/18 1506

## 2018-05-23 ENCOUNTER — Other Ambulatory Visit: Payer: Self-pay

## 2018-05-23 ENCOUNTER — Inpatient Hospital Stay (HOSPITAL_COMMUNITY): Payer: Medicaid Other

## 2018-05-23 ENCOUNTER — Encounter (HOSPITAL_COMMUNITY): Payer: Self-pay

## 2018-05-23 ENCOUNTER — Inpatient Hospital Stay (HOSPITAL_COMMUNITY)
Admission: AD | Admit: 2018-05-23 | Discharge: 2018-05-23 | Disposition: A | Discharge status: Home / Self Care | Payer: Medicaid Other | Source: Ambulatory Visit | Attending: Obstetrics and Gynecology | Admitting: Obstetrics and Gynecology

## 2018-05-23 DIAGNOSIS — Z880 Allergy status to penicillin: Secondary | ICD-10-CM | POA: Insufficient documentation

## 2018-05-23 DIAGNOSIS — O26891 Other specified pregnancy related conditions, first trimester: Secondary | ICD-10-CM | POA: Diagnosis not present

## 2018-05-23 DIAGNOSIS — O26899 Other specified pregnancy related conditions, unspecified trimester: Secondary | ICD-10-CM

## 2018-05-23 DIAGNOSIS — R109 Unspecified abdominal pain: Secondary | ICD-10-CM | POA: Diagnosis present

## 2018-05-23 DIAGNOSIS — Z3A01 Less than 8 weeks gestation of pregnancy: Secondary | ICD-10-CM | POA: Insufficient documentation

## 2018-05-23 DIAGNOSIS — Z3490 Encounter for supervision of normal pregnancy, unspecified, unspecified trimester: Secondary | ICD-10-CM

## 2018-05-23 DIAGNOSIS — O26892 Other specified pregnancy related conditions, second trimester: Secondary | ICD-10-CM

## 2018-05-23 HISTORY — DX: Gestational (pregnancy-induced) hypertension without significant proteinuria, unspecified trimester: O13.9

## 2018-05-23 HISTORY — DX: Unspecified abnormal cytological findings in specimens from vagina: R87.629

## 2018-05-23 LAB — HCG, QUANTITATIVE, PREGNANCY: hCG, Beta Chain, Quant, S: 2064 m[IU]/mL — ABNORMAL HIGH (ref <5)

## 2018-05-23 LAB — CBC
HCT: 34.7 % — ABNORMAL LOW (ref 36.0–46.0)
Hemoglobin: 11.7 g/dL — ABNORMAL LOW (ref 12.0–15.0)
MCH: 25.3 pg — ABNORMAL LOW (ref 26.0–34.0)
MCHC: 33.7 g/dL (ref 30.0–36.0)
MCV: 75.1 fL — ABNORMAL LOW (ref 78.0–100.0)
Platelets: 345 10*3/uL (ref 150–400)
RBC: 4.62 MIL/uL (ref 3.87–5.11)
RDW: 16.2 % — ABNORMAL HIGH (ref 11.5–15.5)
WBC: 7.5 10*3/uL (ref 4.0–10.5)

## 2018-05-23 LAB — URINALYSIS, ROUTINE W REFLEX MICROSCOPIC
Bilirubin Urine: NEGATIVE
Glucose, UA: NEGATIVE mg/dL
Hgb urine dipstick: NEGATIVE
Ketones, ur: NEGATIVE mg/dL
Leukocytes, UA: NEGATIVE
Nitrite: NEGATIVE
Protein, ur: NEGATIVE mg/dL
Specific Gravity, Urine: 1.013 (ref 1.005–1.030)
pH: 6 (ref 5.0–8.0)

## 2018-05-23 LAB — WET PREP, GENITAL
Clue Cells Wet Prep HPF POC: NONE SEEN
Sperm: NONE SEEN
Trich, Wet Prep: NONE SEEN
Yeast Wet Prep HPF POC: NONE SEEN

## 2018-05-23 LAB — POCT PREGNANCY, URINE: Preg Test, Ur: POSITIVE — AB

## 2018-05-23 NOTE — MAU Provider Note (Signed)
History     CSN: [id number]  Arrival date and time: 05/23/18 [id number]    Chief Complaint  Patient presents with  . Abdominal Pain   G2P1001 @[redacted]w[redacted]d  by unsure LMP here with LAP. Pain started 3-4 days ago. Pain is intermittent and sharp. Has not taken anything for it. No urinary or GI sx. No VB or discharge.    OB History    Gravida  2   Para  0   Term  0   Preterm  0   AB  0   Living  0     SAB  0   TAB  0   Ectopic  0   Multiple  0   Live Births  0           Past Medical History:  Diagnosis Date  . Hypertension   . Pregnancy induced hypertension    gHTN; no meds  . Vaginal Pap smear, abnormal     Past Surgical History:  Procedure Laterality Date  . BLADDER SURGERY     27 years old    No family history on file.  Social History   Tobacco Use  . Smoking status: Never Smoker  . Smokeless tobacco: Never Used  Substance Use Topics  . Alcohol use: No  . Drug use: No    Allergies:  Allergies  Allergen Reactions  . Penicillins Swelling and Other (See Comments)    Reaction:  Facial swelling  Has patient had a PCN reaction causing immediate rash, facial/tongue/throat swelling, SOB or lightheadedness with hypotension: Yes Has patient had a PCN reaction causing severe rash involving mucus membranes or skin necrosis: No Has patient had a PCN reaction that required hospitalization No Has patient had a PCN reaction occurring within the last 10 years: Yes If all of the above answers are "NO", then may proceed with Cephalosporin use.    No medications prior to admission.    Review of Systems  Constitutional: Negative for fever.  Gastrointestinal: Positive for abdominal pain. Negative for constipation, diarrhea, nausea and vomiting.  Genitourinary: Negative for dysuria, vaginal bleeding and vaginal discharge.   Physical Exam   Blood pressure 106/61, pulse 87, temperature 98.2 F (36.8 C), temperature source Oral, resp. rate 18, height 6' (1.829 m),  weight 115.3 kg, last menstrual period 04/21/2018, SpO2 100 %, unknown if currently breastfeeding.  Physical Exam  Constitutional: She is oriented to person, place, and time. She appears well-developed and well-nourished. No distress.  HENT:  Head: Normocephalic and atraumatic.  Neck: Normal range of motion.  Cardiovascular: Normal rate.  Respiratory: Effort normal. No respiratory distress.  GI: Soft. She exhibits no distension. There is no tenderness.  Genitourinary:  Genitourinary Comments: External: no lesions or erythema Vagina: rugated, pink, moist, small white discharge Uterus: non enlarged, anteverted, non tender, no CMT Adnexae: no masses, no tenderness left, no tenderness right Cervix closed, nml   Musculoskeletal: Normal range of motion.  Neurological: She is alert and oriented to person, place, and time.  Skin: Skin is warm and dry.  Psychiatric: She has a normal mood and affect.   Results for orders placed or performed during the hospital encounter of 05/23/18 (from the past 24 hour(s))  Urinalysis, Routine w reflex microscopic     Status: None   Collection Time: 05/23/18  8:44 AM  Result Value Ref Range   Color, Urine YELLOW YELLOW   APPearance CLEAR CLEAR   Specific Gravity, Urine 1.013 1.005 - 1.030   pH 6.0 5.0 -  8.0   Glucose, UA NEGATIVE NEGATIVE mg/dL   Hgb urine dipstick NEGATIVE NEGATIVE   Bilirubin Urine NEGATIVE NEGATIVE   Ketones, ur NEGATIVE NEGATIVE mg/dL   Protein, ur NEGATIVE NEGATIVE mg/dL   Nitrite NEGATIVE NEGATIVE   Leukocytes, UA NEGATIVE NEGATIVE  Pregnancy, urine POC     Status: Abnormal   Collection Time: 05/23/18  8:48 AM  Result Value Ref Range   Preg Test, Ur POSITIVE (A) NEGATIVE  Wet prep, genital     Status: Abnormal   Collection Time: 05/23/18  9:07 AM  Result Value Ref Range   Yeast Wet Prep HPF POC NONE SEEN NONE SEEN   Trich, Wet Prep NONE SEEN NONE SEEN   Clue Cells Wet Prep HPF POC NONE SEEN NONE SEEN   WBC, Wet Prep HPF  POC FEW (A) NONE SEEN   Sperm NONE SEEN   CBC     Status: Abnormal   Collection Time: 05/23/18  9:27 AM  Result Value Ref Range   WBC 7.5 4.0 - 10.5 K/uL   RBC 4.62 3.87 - 5.11 MIL/uL   Hemoglobin 11.7 (L) 12.0 - 15.0 g/dL   HCT 95.634.7 (L) 21.336.0 - 08.646.0 %   MCV 75.1 (L) 78.0 - 100.0 fL   MCH 25.3 (L) 26.0 - 34.0 pg   MCHC 33.7 30.0 - 36.0 g/dL   RDW 57.816.2 (H) 46.911.5 - 62.915.5 %   Platelets 345 150 - 400 K/uL  hCG, quantitative, pregnancy     Status: Abnormal   Collection Time: 05/23/18  9:27 AM  Result Value Ref Range   hCG, Beta Chain, Quant, S 2,064 (H) <5 mIU/mL   [place] Ob Less Than 14 Weeks With Ob Transvaginal  Result Date: 05/23/2018 CLINICAL DATA:  Abdominal pain in 1st trimester pregnancy. Gestational age by LMP of 4 weeks 4 days. EXAM: OBSTETRIC <14 WK US AND TRANSVAGINAL OB US TECHNIQUE: Both transabdominal and transvaginal ultrasound examinations were performed for complete evaluation of the gestation as well as the maternal uterus, adnexal regions, and pelvic cul-de-sac. Transvaginal technique was performed to assess early pregnancy. COMPARISON:  None. FINDINGS: Intrauterine gestational sac: Single Yolk sac:  Visualized. Embryo:  Not Visualized. MSD: 4 mm   5 w   1 d Subchorionic hemorrhage:  None visualized. Maternal uterus/adnexae: Small left ovarian corpus luteum noted. Normal appearance of right ovary. No adnexal mass or abnormal free fluid identified. IMPRESSION: Single intrauterine gestational sac measuring 5 weeks 1 day by mean sac diameter. This is concordant with LMP. Consider following b-hCG levels, with followup ultrasound to assess viability in 10-14 days. No significant maternal uterine or adnexal abnormality identified. Electronically Signed   By: Myles RosenthalJohn  Stahl M.D.   On: 05/23/2018 10:10    MAU Course  Procedures  MDM Labs and US ordered and reviewed. IUGS and YS seen on US ruling out ectopic. No evidence of pathology. Pain likely physiologic to early pregnancy. Recommend f/u  US for viability and dating, pt moving to [place] in 3 days, will obtain OB care there. Stable for discharge home.  Assessment and Plan   1. Early stage of pregnancy   2. Abdominal pain affecting pregnancy    Discharge home Follow up with OB of choice in 3-4 weeks SAB precautions  Allergies as of 05/23/2018      Reactions   Penicillins Swelling, Other (See Comments)   Reaction:  Facial swelling  Has patient had a PCN reaction causing immediate rash, facial/tongue/throat swelling, SOB or lightheadedness with hypotension: Yes Has  patient had a PCN reaction causing severe rash involving mucus membranes or skin necrosis: No Has patient had a PCN reaction that required hospitalization No Has patient had a PCN reaction occurring within the last 10 years: Yes If all of the above answers are "NO", then may proceed with Cephalosporin use.      Medication List    STOP taking these medications   benzonatate 100 MG capsule Commonly known as:  TESSALON   ibuprofen 600 MG tablet Commonly known as:  ADVIL,MOTRIN   methocarbamol 500 MG tablet Commonly known as:  ROBAXIN   SPRINTEC 28 0.25-35 MG-MCG tablet Generic drug:  norgestimate-ethinyl estradiol      Donette Larry, CNM 05/23/2018, 12:59 PM

## 2018-05-23 NOTE — Discharge Instructions (Signed)
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
## 2018-05-26 LAB — GC/CHLAMYDIA PROBE AMP (~~LOC~~) NOT AT ARMC
Chlamydia: NEGATIVE
Neisseria Gonorrhea: NEGATIVE

## 2018-07-17 ENCOUNTER — Other Ambulatory Visit: Payer: Self-pay | Admitting: Obstetrics & Gynecology

## 2018-07-17 ENCOUNTER — Other Ambulatory Visit: Payer: Self-pay

## 2018-07-17 ENCOUNTER — Encounter (HOSPITAL_BASED_OUTPATIENT_CLINIC_OR_DEPARTMENT_OTHER): Payer: Self-pay

## 2018-07-17 NOTE — Progress Notes (Signed)
Spoke with:  Merissa NPO:  After Midnight, no gum, candy, or mints  Arrival time:  [id number] Labs:  N/A AM medications: None Pre op orders: Needs second sign Ride home: Dorinda Hill (husband) [phone]

## 2018-07-21 ENCOUNTER — Other Ambulatory Visit: Payer: Self-pay

## 2018-07-21 ENCOUNTER — Ambulatory Visit (HOSPITAL_BASED_OUTPATIENT_CLINIC_OR_DEPARTMENT_OTHER): Payer: Medicaid Other | Admitting: Anesthesiology

## 2018-07-21 ENCOUNTER — Encounter (HOSPITAL_COMMUNITY)
Admission: RE | Disposition: A | Discharge status: Home / Self Care | Payer: Self-pay | Source: Ambulatory Visit | Attending: Obstetrics & Gynecology

## 2018-07-21 ENCOUNTER — Encounter (HOSPITAL_BASED_OUTPATIENT_CLINIC_OR_DEPARTMENT_OTHER): Payer: Self-pay

## 2018-07-21 ENCOUNTER — Ambulatory Visit (HOSPITAL_BASED_OUTPATIENT_CLINIC_OR_DEPARTMENT_OTHER)
Admission: RE | Admit: 2018-07-21 | Discharge: 2018-07-22 | Disposition: A | Discharge status: Home / Self Care | Payer: Medicaid Other | Source: Ambulatory Visit | Attending: Obstetrics & Gynecology | Admitting: Obstetrics & Gynecology

## 2018-07-21 ENCOUNTER — Ambulatory Visit (HOSPITAL_COMMUNITY): Payer: Medicaid Other

## 2018-07-21 DIAGNOSIS — Z9889 Other specified postprocedural states: Secondary | ICD-10-CM

## 2018-07-21 DIAGNOSIS — Z880 Allergy status to penicillin: Secondary | ICD-10-CM | POA: Insufficient documentation

## 2018-07-21 DIAGNOSIS — O021 Missed abortion: Secondary | ICD-10-CM | POA: Diagnosis present

## 2018-07-21 HISTORY — PX: DILATION AND EVACUATION: SHX1459

## 2018-07-21 LAB — CBC
HCT: 32.8 % — ABNORMAL LOW (ref 36.0–46.0)
Hemoglobin: 10.5 g/dL — ABNORMAL LOW (ref 12.0–15.0)
MCH: 24.8 pg — ABNORMAL LOW (ref 26.0–34.0)
MCHC: 32 g/dL (ref 30.0–36.0)
MCV: 77.5 fL — ABNORMAL LOW (ref 80.0–100.0)
Platelets: 259 10*3/uL (ref 150–400)
RBC: 4.23 MIL/uL (ref 3.87–5.11)
RDW: 15.8 % — ABNORMAL HIGH (ref 11.5–15.5)
WBC: 8.3 10*3/uL (ref 4.0–10.5)
nRBC: 0 % (ref 0.0–0.2)

## 2018-07-21 SURGERY — DILATION AND EVACUATION, UTERUS
Anesthesia: General | Site: Uterus

## 2018-07-21 MED ORDER — ONDANSETRON HCL 4 MG/2ML IJ SOLN: 4.0000 mg | Freq: Four times a day (QID) | INTRAMUSCULAR | Status: DC | PRN

## 2018-07-21 MED ORDER — PROPOFOL 10 MG/ML IV BOLUS
INTRAVENOUS | Status: DC | PRN
  Administered 2018-07-21: 200 mg via INTRAVENOUS

## 2018-07-21 MED ORDER — OXYCODONE-ACETAMINOPHEN 5-325 MG PO TABS: 2.0000 | ORAL_TABLET | ORAL | Status: DC | PRN

## 2018-07-21 MED ORDER — SIMETHICONE 80 MG PO CHEW: 80.0000 mg | CHEWABLE_TABLET | Freq: Four times a day (QID) | ORAL | Status: DC | PRN

## 2018-07-21 MED ORDER — OXYTOCIN 10 UNIT/ML IJ SOLN
INTRAMUSCULAR | Status: AC
  Filled 2018-07-21: qty 2

## 2018-07-21 MED ORDER — HYDROMORPHONE HCL 1 MG/ML IJ SOLN: 0.2000 mg | INTRAMUSCULAR | Status: DC | PRN

## 2018-07-21 MED ORDER — LACTATED RINGERS IV SOLN
INTRAVENOUS | Status: DC
  Administered 2018-07-21 (×4): via INTRAVENOUS
  Filled 2018-07-21: qty 1000

## 2018-07-21 MED ORDER — ACETAMINOPHEN 325 MG PO TABS: 650.0000 mg | ORAL_TABLET | ORAL | Status: DC | PRN

## 2018-07-21 MED ORDER — LIDOCAINE 2% (20 MG/ML) 5 ML SYRINGE
INTRAMUSCULAR | Status: AC
  Filled 2018-07-21: qty 5

## 2018-07-21 MED ORDER — ONDANSETRON HCL 4 MG PO TABS: 4.0000 mg | ORAL_TABLET | Freq: Four times a day (QID) | ORAL | Status: DC | PRN

## 2018-07-21 MED ORDER — OXYTOCIN 10 UNIT/ML IJ SOLN
INTRAVENOUS | Status: DC | PRN
  Administered 2018-07-21: 20 [IU] via INTRAVENOUS

## 2018-07-21 MED ORDER — SODIUM CHLORIDE 0.9 % IV SOLN
100.0000 mg | Freq: Once | INTRAVENOUS | Status: AC
  Administered 2018-07-21: 100 mg via INTRAVENOUS
  Filled 2018-07-21 (×2): qty 100

## 2018-07-21 MED ORDER — ONDANSETRON HCL 4 MG/2ML IJ SOLN
INTRAMUSCULAR | Status: DC | PRN
  Administered 2018-07-21: 4 mg via INTRAVENOUS

## 2018-07-21 MED ORDER — MENTHOL 3 MG MT LOZG: 1.0000 | LOZENGE | OROMUCOSAL | Status: DC | PRN

## 2018-07-21 MED ORDER — MIDAZOLAM HCL 2 MG/2ML IJ SOLN
INTRAMUSCULAR | Status: AC
  Filled 2018-07-21: qty 2

## 2018-07-21 MED ORDER — HYDROMORPHONE HCL 1 MG/ML IJ SOLN
0.2500 mg | INTRAMUSCULAR | Status: DC | PRN
  Filled 2018-07-21: qty 0.5

## 2018-07-21 MED ORDER — PROPOFOL 10 MG/ML IV BOLUS
INTRAVENOUS | Status: AC
  Filled 2018-07-21: qty 20

## 2018-07-21 MED ORDER — KETOROLAC TROMETHAMINE 30 MG/ML IJ SOLN: 30.0000 mg | Freq: Four times a day (QID) | INTRAMUSCULAR | Status: DC

## 2018-07-21 MED ORDER — ALBUMIN HUMAN 5 % IV SOLN
INTRAVENOUS | Status: DC | PRN
  Administered 2018-07-21: 10:00:00 via INTRAVENOUS

## 2018-07-21 MED ORDER — OXYCODONE-ACETAMINOPHEN 5-325 MG PO TABS: 1.0000 | ORAL_TABLET | ORAL | Status: DC | PRN

## 2018-07-21 MED ORDER — SILVER NITRATE-POT NITRATE 75-25 % EX MISC
CUTANEOUS | Status: DC | PRN
  Administered 2018-07-21: 3 via TOPICAL

## 2018-07-21 MED ORDER — LACTATED RINGERS IV SOLN
INTRAVENOUS | Status: DC
  Administered 2018-07-21 – 2018-07-22 (×3): via INTRAVENOUS

## 2018-07-21 MED ORDER — SODIUM CHLORIDE 0.9 % IR SOLN
Status: DC | PRN
  Administered 2018-07-21: 500 mL

## 2018-07-21 MED ORDER — LIDOCAINE 2% (20 MG/ML) 5 ML SYRINGE
INTRAMUSCULAR | Status: DC | PRN
  Administered 2018-07-21: 100 mg via INTRAVENOUS

## 2018-07-21 MED ORDER — OXYTOCIN 10 UNIT/ML IJ SOLN
INTRAMUSCULAR | Status: DC | PRN
  Administered 2018-07-21: 20 [IU]

## 2018-07-21 MED ORDER — PROMETHAZINE HCL 25 MG/ML IJ SOLN
6.2500 mg | INTRAMUSCULAR | Status: DC | PRN
  Filled 2018-07-21: qty 1

## 2018-07-21 MED ORDER — KETOROLAC TROMETHAMINE 30 MG/ML IJ SOLN
INTRAMUSCULAR | Status: AC
  Filled 2018-07-21: qty 1

## 2018-07-21 MED ORDER — MIDAZOLAM HCL 5 MG/5ML IJ SOLN
INTRAMUSCULAR | Status: DC | PRN
  Administered 2018-07-21: 2 mg via INTRAVENOUS

## 2018-07-21 MED ORDER — KETOROLAC TROMETHAMINE 30 MG/ML IJ SOLN
30.0000 mg | Freq: Four times a day (QID) | INTRAMUSCULAR | Status: DC
  Administered 2018-07-21 – 2018-07-22 (×3): 30 mg via INTRAVENOUS
  Filled 2018-07-21 (×3): qty 1

## 2018-07-21 MED ORDER — DEXAMETHASONE SODIUM PHOSPHATE 4 MG/ML IJ SOLN
INTRAMUSCULAR | Status: DC | PRN
  Administered 2018-07-21: 10 mg via INTRAVENOUS

## 2018-07-21 MED ORDER — FENTANYL CITRATE (PF) 100 MCG/2ML IJ SOLN
INTRAMUSCULAR | Status: DC | PRN
  Administered 2018-07-21: 100 ug via INTRAVENOUS

## 2018-07-21 MED ORDER — OXYCODONE HCL 5 MG/5ML PO SOLN
5.0000 mg | Freq: Once | ORAL | Status: DC | PRN
  Filled 2018-07-21: qty 5

## 2018-07-21 MED ORDER — LIDOCAINE HCL 1 % IJ SOLN
INTRAMUSCULAR | Status: DC | PRN
  Administered 2018-07-21: 19 mL

## 2018-07-21 MED ORDER — OXYCODONE HCL 5 MG PO TABS
5.0000 mg | ORAL_TABLET | Freq: Once | ORAL | Status: DC | PRN
  Filled 2018-07-21: qty 1

## 2018-07-21 MED ORDER — METHYLERGONOVINE MALEATE 0.2 MG/ML IJ SOLN
INTRAMUSCULAR | Status: DC | PRN
  Administered 2018-07-21: 0.2 mg via INTRAMUSCULAR

## 2018-07-21 MED ORDER — ONDANSETRON HCL 4 MG/2ML IJ SOLN
INTRAMUSCULAR | Status: AC
  Filled 2018-07-21: qty 2

## 2018-07-21 MED ORDER — FENTANYL CITRATE (PF) 100 MCG/2ML IJ SOLN
INTRAMUSCULAR | Status: AC
  Filled 2018-07-21: qty 2

## 2018-07-21 MED ORDER — DEXAMETHASONE SODIUM PHOSPHATE 10 MG/ML IJ SOLN
INTRAMUSCULAR | Status: AC
  Filled 2018-07-21: qty 1

## 2018-07-21 SURGICAL SUPPLY — 20 items
CATH ROBINSON RED A/P 16FR (CATHETERS) ×2 IMPLANT
DILATOR CANAL MILEX (MISCELLANEOUS) IMPLANT
FILTER UTR ASPR ASSEMBLY (MISCELLANEOUS) ×2 IMPLANT
GLOVE BIO SURGEON STRL SZ 6.5 (GLOVE) ×2 IMPLANT
GOWN STRL REUS W/TWL LRG LVL3 (GOWN DISPOSABLE) ×4 IMPLANT
HOSE CONNECTING 18IN BERKELEY (TUBING) ×2 IMPLANT
KIT BERKELEY 1ST TRIMESTER 3/8 (MISCELLANEOUS) ×4 IMPLANT
KIT TURNOVER CYSTO (KITS) ×2 IMPLANT
NS IRRIG 500ML POUR BTL (IV SOLUTION) ×2 IMPLANT
PACK VAGINAL MINOR WOMEN LF (CUSTOM PROCEDURE TRAY) ×2 IMPLANT
PAD OB MATERNITY 4.3X12.25 (PERSONAL CARE ITEMS) ×2 IMPLANT
PAD PREP 24X48 CUFFED NSTRL (MISCELLANEOUS) ×2 IMPLANT
SET BERKELEY SUCTION TUBING (SUCTIONS) ×2 IMPLANT
TOWEL OR 17X24 6PK STRL BLUE (TOWEL DISPOSABLE) ×4 IMPLANT
TRAP TISSUE FILTER (MISCELLANEOUS) ×6 IMPLANT
VACURETTE 10 RIGID CVD (CANNULA) ×2 IMPLANT
VACURETTE 12 RIGID CVD (CANNULA) ×2 IMPLANT
VACURETTE 7MM CVD STRL WRAP (CANNULA) IMPLANT
VACURETTE 8 RIGID CVD (CANNULA) IMPLANT
VACURETTE 9 RIGID CVD (CANNULA) IMPLANT

## 2018-07-21 NOTE — Op Note (Signed)
Operative Report  Heather Riggs female 27 y.o. 07/21/18  Procedure: DILATATION AND EVACUATION  Preoperative Diagnosis:  13 week missed abortion   Post operative Diagnosis:  same   Indications: The patient with fetal demise diagnosed by ultrasound in office. Crown rump length measured 9 weeks by ultrasound.     Surgeon: Essie Hart   Assistants: none  Anesthesia: General LMA inhalational anesthesia and Local anesthesia 1% buffered lidocaine  ASA Class: 2  Procedure Detail  Findings:  13 week size uterus to 8 size post procedure. Large amount of retained POC.  Intraopertive hemorrhage during procedure.  Good crie was achieved.  Estimated Blood Loss:  1300 mL         Drains: straight cath prior to procedure         Total IV Fluids: 2000 ml  Blood Given: none          Specimens: POC         Implants: none        Complications:  Hemorrhage         Technique:   Patient was placed in dorsal lithotomy position in [employer]. After adequate anesthesia was achieved, the patient was prepped and draped in the usual sterile fashion. The operative Graves speculum was placed in the vagina and the cervix stabilized with a single-tooth tenaculum.  A paracervical block using 1% lidocaine was performed. The cervix was dilated with Hank dilators and the 10 mm curette was used to start removing contents of the uterus.  There was notable hemorrhage and sac with placenta was at os was removed with ring forceps.  A larger 12 mm curved curette was used to remove remaining tissue. Alternating sharp curettage with a curette and suction curettage was performed until all contents were removed and good crie was achieved.  The hemorrhage stopped.  Patient was given 20 U of pitocin by anesthesia and 0.2 mg IM dose of methergine.  Intraoperative bedside ultrasound was called in to ensure the uterus was clear of all contents.  The ultrasound showed that the endometrial stripe was thin and the  uterus was empty.   The tenaculum sites were made hemostatic with silver nitrate.  All instruments were removed from the vagina.  The patient tolerated the procedure well.    Disposition: PACU, then extended recovery / observation in [facility] main hospital.         Condition: stable  [name] [name]

## 2018-07-21 NOTE — Anesthesia Postprocedure Evaluation (Signed)
Anesthesia Post Note  Patient: Allaina Brotzman  Procedure(s) Performed: SUCTION DILATATION AND EVACUATION (N/A Uterus)     Patient location during evaluation: PACU Anesthesia Type: General Level of consciousness: awake and alert Pain management: pain level controlled Vital Signs Assessment: post-procedure vital signs reviewed and stable Respiratory status: spontaneous breathing, nonlabored ventilation, respiratory function stable and patient connected to nasal cannula oxygen Cardiovascular status: blood pressure returned to baseline and stable Postop Assessment: no apparent nausea or vomiting Anesthetic complications: no    Last Vitals:  Vitals:   07/21/18 1245 07/21/18 1300  BP: 114/70 117/66  Pulse: 86 88  Resp: 20 18  Temp:    SpO2: 100% 99%    Last Pain:  Vitals:   07/21/18 1300  TempSrc:   PainSc: 0-No pain                 Mala Gibbard P Joni Norrod

## 2018-07-21 NOTE — Anesthesia Procedure Notes (Signed)
Procedure Name: LMA Insertion Date/Time: 07/21/2018 9:24 AM Performed by: Leonides Grills, MD Pre-anesthesia Checklist: Patient identified, Emergency Drugs available, Suction available and Patient being monitored Patient Re-evaluated:Patient Re-evaluated prior to induction Oxygen Delivery Method: Circle system utilized Preoxygenation: Pre-oxygenation with 100% oxygen Induction Type: IV induction Ventilation: Mask ventilation without difficulty LMA: LMA inserted LMA Size: 4.0 Number of attempts: 1 Airway Equipment and Method: Bite block Placement Confirmation: positive ETCO2 Tube secured with: Tape Dental Injury: Teeth and Oropharynx as per pre-operative assessment

## 2018-07-21 NOTE — Anesthesia Preprocedure Evaluation (Addendum)
Anesthesia Evaluation  Patient identified by MRN, date of birth, ID band Patient awake    Reviewed: Allergy & Precautions, NPO status , Patient's Chart, lab work & pertinent test results  Airway Mallampati: II  TM Distance: >3 FB Neck ROM: Full    Dental no notable dental hx.    Pulmonary neg pulmonary ROS,    Pulmonary exam normal breath sounds clear to auscultation       Cardiovascular hypertension, Normal cardiovascular exam Rhythm:Regular Rate:Normal     Neuro/Psych negative neurological ROS  negative psych ROS   GI/Hepatic negative GI ROS, Neg liver ROS,   Endo/Other  negative endocrine ROS  Renal/GU negative Renal ROS     Musculoskeletal negative musculoskeletal ROS (+)   Abdominal (+) + obese,   Peds  Hematology negative hematology ROS (+)   Anesthesia Other Findings missed miscarriage  Reproductive/Obstetrics [redacted] weeks gestational age                            Anesthesia Physical Anesthesia Plan  ASA: II  Anesthesia Plan: General   Post-op Pain Management:    Induction: Intravenous  PONV Risk Score and Plan: 4 or greater and Dexamethasone, Midazolam, Treatment may vary due to age or medical condition and Ondansetron  Airway Management Planned: LMA  Additional Equipment:   Intra-op Plan:   Post-operative Plan: Extubation in OR  Informed Consent: I have reviewed the patients History and Physical, chart, labs and discussed the procedure including the risks, benefits and alternatives for the proposed anesthesia with the patient or authorized representative who has indicated his/her understanding and acceptance.   Dental advisory given  Plan Discussed with: CRNA  Anesthesia Plan Comments:        Anesthesia Quick Evaluation

## 2018-07-21 NOTE — H&P (Signed)
Heather Riggs is an 27 y.o. female  Diagnosed with missed ab at 12+ weeks by LMP in the office Single IUP was seen but no cardiac activity.  The fetus was measuring 9weeks.  Patient w/o bleeding or  Pain.   Pertinent Gynecological History: Menses: normal Bleeding: no abnormal bleeding Contraception: none DES exposure: denies Blood transfusions: none Sexually transmitted diseases: no past history Previous GYN Procedures: none  Last mammogram:N/a Last pap: normal Date: 2019 OB History: G2, P1   Menstrual History: Menarche age: 62 Patient's last menstrual period was 04/21/2018 (approximate).    Past Medical History:  Diagnosis Date  . Pregnancy induced hypertension    gHTN; no meds  . Vaginal Pap smear, abnormal     Past Surgical History:  Procedure Laterality Date  . BLADDER SURGERY     27 years old    History reviewed. No pertinent family history.  Social History:  reports that she has never smoked. She has never used smokeless tobacco. She reports that she does not drink alcohol or use drugs.  Allergies:  Allergies  Allergen Reactions  . Penicillins Swelling and Other (See Comments)    Reaction:  Facial swelling  Has patient had a PCN reaction causing immediate rash, facial/tongue/throat swelling, SOB or lightheadedness with hypotension: Yes Has patient had a PCN reaction causing severe rash involving mucus membranes or skin necrosis: No Has patient had a PCN reaction that required hospitalization No Has patient had a PCN reaction occurring within the last 10 years: Yes If all of the above answers are "NO", then may proceed with Cephalosporin use.    Medications Prior to Admission  Medication Sig Dispense Refill Last Dose  . acetaminophen (TYLENOL) 325 MG tablet Take 650 mg by mouth every 6 (six) hours as needed.   07/18/2018    ROS  Blood pressure 122/71, pulse 87, temperature 98.5 F (36.9 C), temperature source Oral, resp. rate 16, height 6' (1.829 m),  weight 113 kg, last menstrual period 04/21/2018, SpO2 100 %, unknown if currently breastfeeding. Physical Exam Deferred to OR No results found for this or any previous visit (from the past 24 hour(s)).  No results found.  Assessment/Plan: 27 year old with missed ab Discussed options with patient to include expectant management, D&E, misoprostol.  Patient opted for surgical management.  Rh Positive On call to OR for D&E Risks of procedure explained to patient and all inquiries answered. Consent signed  And witnessed and placed into chart.    Heather Riggs Heather Riggs 07/21/2018, 9:11 AM

## 2018-07-21 NOTE — Transfer of Care (Signed)
   Last Vitals:  Vitals Value Taken Time  BP 114/64 07/21/2018 10:30 AM  Temp 36.8 C 07/21/2018 10:27 AM  Pulse 86 07/21/2018 10:32 AM  Resp 19 07/21/2018 10:32 AM  SpO2 100 % 07/21/2018 10:32 AM  Vitals shown include unvalidated device data.  Last Pain:  Vitals:   07/21/18 1027  TempSrc:   PainSc: (P) Asleep      Patients Stated Pain Goal: (P) 8 (07/21/18 1027)  Immediate Anesthesia Transfer of Care Note  Patient: Heather Riggs  Procedure(s) Performed: Procedure(s) (LRB): SUCTION DILATATION AND EVACUATION (N/A)  Patient Location: PACU  Anesthesia Type: General  Level of Consciousness: awake, alert  and oriented  Airway & Oxygen Therapy: Patient Spontanous Breathing and Patient connected to nasal cannula oxygen  Post-op Assessment: Report given to PACU RN and Post -op Vital signs reviewed and stable  Post vital signs: Reviewed and stable  Complications: No apparent anesthesia complications

## 2018-07-22 ENCOUNTER — Encounter (HOSPITAL_BASED_OUTPATIENT_CLINIC_OR_DEPARTMENT_OTHER): Payer: Self-pay | Admitting: Obstetrics & Gynecology

## 2018-07-22 DIAGNOSIS — O021 Missed abortion: Secondary | ICD-10-CM | POA: Diagnosis not present

## 2018-07-22 MED ORDER — DOXYCYCLINE HYCLATE 100 MG PO CAPS: 100.0000 mg | ORAL_CAPSULE | Freq: Two times a day (BID) | ORAL | 0 refills | Status: AC

## 2018-07-22 MED ORDER — IBUPROFEN 800 MG PO TABS: 800.0000 mg | ORAL_TABLET | Freq: Three times a day (TID) | ORAL | 0 refills | Status: DC | PRN

## 2018-07-22 MED ORDER — HYDROCODONE-ACETAMINOPHEN 5-325 MG PO TABS: 1.0000 | ORAL_TABLET | Freq: Four times a day (QID) | ORAL | 0 refills | Status: DC | PRN

## 2018-07-22 NOTE — Progress Notes (Signed)
1 Day Post-Op Procedure(s) (LRB): SUCTION DILATATION AND EVACUATION (N/A)  Subjective: Patient reports feeling good, no bleeding, no pain.  No fever or chills.   Objective: I have reviewed patient's vital signs, medications and labs.  General: alert, cooperative and no distress GI: soft, non-tender; bowel sounds normal; no masses,  no organomegaly Extremities: extremities normal, atraumatic, no cyanosis or edema Vaginal Bleeding: none  Assessment: s/p Procedure(s): SUCTION DILATATION AND EVACUATION (N/A): progressing well  Plan: Discharge home  LOS: 0 days    Kalina Morabito [name] 07/22/2018, 10:04 AM

## 2018-07-22 NOTE — Discharge Summary (Signed)
Physician Discharge Summary  Patient ID: Heather Riggs MRN: [id number] DOB/AGE: 1991-09-21 27 y.o.  Admit date: 07/21/2018 Discharge date: 07/22/2018  Admission Diagnoses:  Discharge Diagnoses:  Active Problems:   Status post dilatation and curettage   Discharged Condition: good  Hospital Course: Patient taken to operating room for D&E after diagnosis of missed ab.  There was intraoperative hemorrhage, so decision made to admit patient for 24 hour observation.  Patient remained stable and progressed well and was discharged home on POD #1  Consults: None  Significant Diagnostic Studies: labs: pre op Hb 11 post op 10.5  Treatments: IV hydration, antibiotics: Vibramycin and surgery: D&E  Discharge Exam: Blood pressure (!) 102/50, pulse (!) 52, temperature 98.4 F (36.9 C), temperature source Oral, resp. rate 16, height 6' (1.829 m), weight 113 kg, last menstrual period 04/21/2018, SpO2 100 %, unknown if currently breastfeeding. General: alert, cooperative and no distress GI: soft, non-tender; bowel sounds normal; no masses,  no organomegaly Extremities: extremities normal, atraumatic, no cyanosis or edema Vaginal Bleeding: none  Disposition: Discharge disposition: 01-Home or Self Care       Discharge Instructions    Call MD for:   Complete by:  As directed    Abnormal foul smelling vaginal discharge or heavy vaginal bleeding   Call MD for:  difficulty breathing, headache or visual disturbances   Complete by:  As directed    Call MD for:  extreme fatigue   Complete by:  As directed    Call MD for:  hives   Complete by:  As directed    Call MD for:  persistant dizziness or light-headedness   Complete by:  As directed    Call MD for:  persistant nausea and vomiting   Complete by:  As directed    Call MD for:  redness, tenderness, or signs of infection (pain, swelling, redness, odor or green/yellow discharge around incision site)   Complete by:  As directed    Call MD for:  severe uncontrolled pain   Complete by:  As directed    Call MD for:  temperature >100.4   Complete by:  As directed    Diet - low sodium heart healthy   Complete by:  As directed    Discharge instructions   Complete by:  As directed    Call for the office if you need an emergent visit, and if you have any questions or concerns.   Driving Restrictions   Complete by:  As directed    No driving if you require percocet   Increase activity slowly   Complete by:  As directed    No wound care   Complete by:  As directed    Sexual Activity Restrictions   Complete by:  As directed    No intercourse or anything in vagina for 4-6 weeks or until seen at post operative visit.     Allergies as of 07/22/2018      Reactions   Penicillins Swelling, Other (See Comments)   Reaction:  Facial swelling  Has patient had a PCN reaction causing immediate rash, facial/tongue/throat swelling, SOB or lightheadedness with hypotension: Yes Has patient had a PCN reaction causing severe rash involving mucus membranes or skin necrosis: No Has patient had a PCN reaction that required hospitalization No Has patient had a PCN reaction occurring within the last 10 years: Yes If all of the above answers are "NO", then may proceed with Cephalosporin use.      Medication List  STOP taking these medications   acetaminophen 325 MG tablet Commonly known as:  TYLENOL     TAKE these medications   doxycycline 100 MG capsule Commonly known as:  VIBRAMYCIN Take 1 capsule (100 mg total) by mouth 2 (two) times daily for 7 days.   HYDROcodone-acetaminophen 5-325 MG tablet Commonly known as:  NORCO/VICODIN Take 1 tablet by mouth every 6 (six) hours as needed for moderate pain.   ibuprofen 800 MG tablet Commonly known as:  ADVIL,MOTRIN Take 1 tablet (800 mg total) by mouth every 8 (eight) hours as needed for cramping.        SignedWynonia Hazard 07/22/2018, 10:08 AM

## 2018-07-22 NOTE — Care Management Note (Signed)
Case Management Note  Patient Details  Name: Masen Salvas MRN: [id number] Date of Birth: 06-10-1991  Subjective/Objective:                  Discharged to home with self care  Action/Plan: No cm needs present  Expected Discharge Date:  07/22/18               Expected Discharge Plan:  Home/Self Care  In-House Referral:     Discharge planning Services     Post Acute Care Choice:    Choice offered to:     DME Arranged:    DME Agency:     HH Arranged:    HH Agency:     Status of Service:  Completed, signed off  If discussed at [employer] of Stay Meetings, dates discussed:    Additional Comments:  Golda Acre, RN 07/22/2018, 10:28 AM

## 2018-10-08 NOTE — L&D Delivery Note (Signed)
Delivery Note Patient pushed for less than 30 minutes after she was noted to be C/C/+2 At 3:25 PM a viable and healthy female was delivered via Vaginal, Spontaneous (Presentation: Direct OP) through a loose nuchal / body cord. Baby laid on maternal abdomen. APGAR: 9, 9; weight pending.   Delay cord clamping done.  Cord cut.  Cord blood obtained. Placenta spontaneously delivered, intact with 3 vessels.  Uterine atony alleviated by IV pitocin and massage.  Second degree perineal laceration repaired in routine fashion using 2-0 vicryl and 3-0 chromic. Several interrupted sutures re-approximated small labial tears that were hemostatic.   Patient tolerated delivery well, there were no complications.   Anesthesia:  Epidural Episiotomy: None Lacerations: 2nd degree Suture Repair: 2.0 vicryl & 3-0 Chromic Est. Blood Loss (mL): 150  Mom to postpartum.  Baby to Couplet care / Skin to Skin.  Abygail Galeno, [place] 08/26/2019, 3:55 PM

## 2018-12-19 ENCOUNTER — Encounter (HOSPITAL_COMMUNITY): Payer: Self-pay

## 2018-12-19 ENCOUNTER — Emergency Department (HOSPITAL_COMMUNITY)
Admission: EM | Admit: 2018-12-19 | Discharge: 2018-12-19 | Disposition: A | Discharge status: Home / Self Care | Payer: Medicaid Other | Attending: Emergency Medicine | Admitting: Emergency Medicine

## 2018-12-19 ENCOUNTER — Other Ambulatory Visit: Payer: Self-pay

## 2018-12-19 DIAGNOSIS — Z79899 Other long term (current) drug therapy: Secondary | ICD-10-CM | POA: Insufficient documentation

## 2018-12-19 DIAGNOSIS — R51 Headache: Secondary | ICD-10-CM | POA: Insufficient documentation

## 2018-12-19 DIAGNOSIS — R519 Headache, unspecified: Secondary | ICD-10-CM

## 2018-12-19 MED ORDER — PROCHLORPERAZINE MALEATE 5 MG PO TABS
10.0000 mg | ORAL_TABLET | Freq: Once | ORAL | Status: AC
  Administered 2018-12-19: 10 mg via ORAL
  Filled 2018-12-19: qty 2

## 2018-12-19 MED ORDER — BENZONATATE 100 MG PO CAPS: 100.0000 mg | ORAL_CAPSULE | Freq: Three times a day (TID) | ORAL | 0 refills | Status: DC | PRN

## 2018-12-19 MED ORDER — ACETAMINOPHEN 325 MG PO TABS
650.0000 mg | ORAL_TABLET | Freq: Once | ORAL | Status: AC
  Administered 2018-12-19: 650 mg via ORAL
  Filled 2018-12-19: qty 2

## 2018-12-19 NOTE — ED Notes (Signed)
ED Provider at bedside. 

## 2018-12-19 NOTE — Discharge Instructions (Addendum)
Prescription has been sent to your pharmacy for Tessalon.  This is a cough medicine that you can take as directed.  Include information for [facility].  If you do not have a primary care provider he can call the number to schedule follow-up appointment if needed.  Today you were evaluated at the emergency department for a headache.  1. Medications: continue usual home medications 2. Treatment: rest, drink plenty of fluids, if headache persists take ibuprofen with caffeine 3. Follow Up: Please followup with your primary doctor in 3 days for discussion of your diagnoses and further evaluation after today's visit; if you do not have a primary care doctor use the resource guide provided to find one; Please return to the ER for double vision, speech difficulty, gait disturbance, persistent vomiting or other concerns.  Headache:  You are having a headache. No specific cause was found today for your headache. It may have been a migraine or other cause of headache. Stress, anxiety, fatigue, and depression are common triggers for headaches. Your headache today does not appear to be life-threatening or require hospitalization, but often the exact cause of headaches is not determined in the emergency department. Therefore, followup with your doctor is very important to find out what may have caused your headache, and whether or not you need any further diagnostic testing or treatment. Sometimes headaches can appear benign but then more serious symptoms can develop which should prompt an immediate reevaluation by your doctor or the emergency department.  Hydration: Have a goal of about a half liter of water every couple hours to stay well hydrated.   Sleep: Please be sure to get plenty of sleep with a goal of 8 hours per night. Having a regular bed time and bedtime routine can help with this.  Screens: Reduce the amount of time you are in front of screens.  Take  about a 5-10-minute break every hour or every couple hours to give your eyes rest.  Do not use screens in dark rooms.  Glasses with a blue light filter may also help reduce eye fatigue.  Stress: Take steps to reduce stress as much as possible.   Seek immediate medical attention if:  You develop possible problems with medications prescribed. The medications don't resolve your headache, if it recurs, or if you have multiple episodes of vomiting or can't take fluids by mouth You have a change from the usual headache. If you developed a sudden severe headache or confusion, become poorly responsive or faint, developed a fever above 100.4 or problems breathing, have a change in speech, vision, swallowing or understanding, or developed new weakness, numbness, tingling, incoordination or have a seizure.

## 2018-12-19 NOTE — ED Triage Notes (Signed)
Pt arrives POV from home c/o pain on left side of face for the past 3 days. PT reports it feels like a headache, but "is worse than her usual headaches." Pt also reports having a cough and sore throat. Pt a&o x4,

## 2018-12-19 NOTE — ED Notes (Signed)
Discharge instructions and prescriptions discussed with Pt. Pt verbalized understanding. Pt stable and ambulatory.   

## 2018-12-19 NOTE — ED Provider Notes (Signed)
[facility] EMERGENCY DEPARTMENT Provider Note   CSN: [id number] Arrival date & time: 12/19/18  1020    History   Chief Complaint Chief Complaint  Patient presents with  . Headache    pt reports "its worse than her usual headache" on leftside of face  . Sore Throat    and cough    HPI Heather Riggs is a 28 y.o. female sending to emergency department today with chief complaint of headache x3 days.  Patient states the pain is on the left side of her head.  The pain has been constant and she describes it as an ache.  She has tried taking ibuprofen without relief.  She rates the pain 10 of 10 severity.  Denies sudden onset, states the headache has progressively gotten worse.  Admits to associated photophobia.  Patient also states she has a cough and sore throat.  Cough is nonproductive.  States her throat is sore when she swallows. Denies any sick contacts.  History provided by patient.       Past Medical History:  Diagnosis Date  . Pregnancy induced hypertension    gHTN; no meds  . Vaginal Pap smear, abnormal     Patient Active Problem List   Diagnosis Date Noted  . Status post dilatation and curettage 07/21/2018  . Labor and delivery, indication for care 02/06/2017    Past Surgical History:  Procedure Laterality Date  . BLADDER SURGERY     28 years old  . DILATION AND EVACUATION N/A 07/21/2018   Procedure: SUCTION DILATATION AND EVACUATION;  Surgeon: Essie Hart, MD;  Location: [facility] [place];  Service: Gynecology;  Laterality: N/A;     OB History    Gravida  2   Para  0   Term  0   Preterm  0   AB  0   Living  0     SAB  0   TAB  0   Ectopic  0   Multiple  0   Live Births  0            Home Medications    Prior to Admission medications   Medication Sig Start Date End Date Taking? Authorizing Provider  benzonatate (TESSALON) 100 MG capsule Take 1 capsule (100 mg total) by mouth 3 (three) times daily as  needed for cough. 12/19/18   Albrizze, Kaitlyn E, PA-C  HYDROcodone-acetaminophen (NORCO/VICODIN) 5-325 MG tablet Take 1 tablet by mouth every 6 (six) hours as needed for moderate pain. 07/22/18   Essie Hart, MD  ibuprofen (ADVIL,MOTRIN) 800 MG tablet Take 1 tablet (800 mg total) by mouth every 8 (eight) hours as needed for cramping. 07/22/18   Essie Hart, MD    Family History History reviewed. No pertinent family history.  Social History Social History   Tobacco Use  . Smoking status: Never Smoker  . Smokeless tobacco: Never Used  Substance Use Topics  . Alcohol use: No  . Drug use: No     Allergies   Penicillins   Review of Systems Review of Systems  Constitutional: Negative for chills and fever.  HENT: Positive for sore throat. Negative for congestion, ear discharge, ear pain, sinus pressure and sinus pain.   Eyes: Positive for photophobia. Negative for pain and redness.  Respiratory: Positive for cough. Negative for shortness of breath.   Cardiovascular: Negative for chest pain.  Gastrointestinal: Negative for abdominal pain, constipation, diarrhea, nausea and vomiting.  Genitourinary: Negative for dysuria and hematuria.  Musculoskeletal: Negative for back pain and neck pain.  Skin: Negative for wound.  Neurological: Positive for headaches. Negative for weakness and numbness.     Physical Exam Updated Vital Signs BP 127/81 (BP Location: Right Arm)   Pulse (!) 101   Temp 97.8 F (36.6 C) (Oral)   Resp 18   Ht  (1.803 m)   Wt 108.9 kg   LMP 04/21/2018 (Approximate)   SpO2 97%   BMI 33.47 kg/m   Physical Exam Vitals signs and nursing note reviewed.  Constitutional:      Appearance: She is well-developed. She is not toxic-appearing.  HENT:     Head: Normocephalic and atraumatic.     Comments: No sinus or temporal tenderness.     Right Ear: Tympanic membrane and external ear normal.     Left Ear: Tympanic membrane and external ear normal.     Nose:  Nose normal.     Mouth/Throat:     Mouth: Mucous membranes are moist.     Pharynx: Oropharynx is clear. No oropharyngeal exudate or posterior oropharyngeal erythema.     Tonsils: No tonsillar exudate or tonsillar abscesses. Swelling: 0 on the right. 0 on the left.  Eyes:     General: No scleral icterus.       Right eye: No discharge.        Left eye: No discharge.     Extraocular Movements: Extraocular movements intact.     Conjunctiva/sclera: Conjunctivae normal.     Pupils: Pupils are equal, round, and reactive to light.  Neck:     Musculoskeletal: Normal range of motion.  Cardiovascular:     Rate and Rhythm: Normal rate and regular rhythm.     Pulses: Normal pulses.     Heart sounds: Normal heart sounds.  Pulmonary:     Effort: Pulmonary effort is normal.     Breath sounds: Normal breath sounds.  Abdominal:     General: There is no distension.     Palpations: Abdomen is soft.     Tenderness: There is no abdominal tenderness. There is no guarding or rebound.  Musculoskeletal: Normal range of motion.  Skin:    General: Skin is warm and dry.  Neurological:     Mental Status: She is oriented to person, place, and time.     Comments: Speech is clear and goal oriented, follows commands CN III-XII intact, no facial droop Normal strength in upper and lower extremities bilaterally including dorsiflexion and plantar flexion, strong and equal grip strength Sensation normal to light and sharp touch Moves extremities without ataxia, coordination intact Normal finger to nose and rapid alternating movements Normal gait and balance   Psychiatric:        Behavior: Behavior normal.      ED Treatments / Results  Labs (all labs ordered are listed, but only abnormal results are displayed) Labs Reviewed - No data to display  EKG None  Radiology No results found.  Procedures Procedures (including critical care time)  Medications Ordered in ED Medications  acetaminophen  (TYLENOL) tablet 650 mg (650 mg Oral Given 12/19/18 1101)  prochlorperazine (COMPAZINE) tablet 10 mg (10 mg Oral Given 12/19/18 1101)     Initial Impression / Assessment and Plan / ED Course  I have reviewed the triage vital signs and the nursing notes.  Pertinent labs & imaging results that were available during my care of the patient were reviewed by me and considered in my medical decision making (see chart for  details).  Pt HA treated with PO Tylenol and Compazine and improved while in ED.  Presentation is like pts typical HA and non concerning for [facility], ICH, Meningitis, or temporal arteritis. Pt is afebrile with no focal neuro deficits, nuchal rigidity, or change in vision. Pt is to follow up with PCP to discuss prophylactic medication.  Patient's cough is nonproductive and her throat is clear and moist, without erythema or exudate.  Prescription given for Tessalon Perles at discharge. Pt with tachycardia in triage. She decline IV fluids and state she will   Doubt need for further emergent work up at this time. I explained the diagnosis and have given explicit precautions to return to the ER including for any other new or worsening symptoms. The patient understands and accepts the medical plan as it's been dictated and I have answered their questions. Discharge instructions concerning home care and prescriptions have been given. The patient is STABLE and is discharged to home in good condition.   Pt verbalizes understanding and is agreeable with plan to dc.   This note was prepared with assistance of Conservation officer, historic buildings. Occasional wrong-word or sound-a-like substitutions may have occurred due to the inherent limitations of voice recognition software.    Final Clinical Impressions(s) / ED Diagnoses   Final diagnoses:  Acute nonintractable headache, unspecified headache type    ED Discharge Orders         Ordered    benzonatate (TESSALON) 100 MG capsule  3 times daily PRN      12/19/18 1136           Sherene Sires, PA-C 12/19/18 1316    Raeford Razor, MD 12/26/18 1207

## 2019-01-25 ENCOUNTER — Other Ambulatory Visit: Payer: Self-pay

## 2019-01-25 ENCOUNTER — Encounter (HOSPITAL_COMMUNITY): Payer: Self-pay

## 2019-01-25 ENCOUNTER — Inpatient Hospital Stay (HOSPITAL_COMMUNITY)
Admission: AD | Admit: 2019-01-25 | Discharge: 2019-01-25 | Disposition: A | Discharge status: Home / Self Care | Payer: Medicaid Other | Attending: Obstetrics & Gynecology | Admitting: Obstetrics & Gynecology

## 2019-01-25 ENCOUNTER — Inpatient Hospital Stay (HOSPITAL_COMMUNITY): Payer: Medicaid Other

## 2019-01-25 DIAGNOSIS — R1031 Right lower quadrant pain: Secondary | ICD-10-CM | POA: Diagnosis not present

## 2019-01-25 DIAGNOSIS — O26891 Other specified pregnancy related conditions, first trimester: Secondary | ICD-10-CM | POA: Diagnosis not present

## 2019-01-25 DIAGNOSIS — O468X1 Other antepartum hemorrhage, first trimester: Secondary | ICD-10-CM

## 2019-01-25 DIAGNOSIS — Z880 Allergy status to penicillin: Secondary | ICD-10-CM | POA: Diagnosis not present

## 2019-01-25 DIAGNOSIS — O418X10 Other specified disorders of amniotic fluid and membranes, first trimester, not applicable or unspecified: Secondary | ICD-10-CM | POA: Diagnosis not present

## 2019-01-25 DIAGNOSIS — R1032 Left lower quadrant pain: Secondary | ICD-10-CM | POA: Insufficient documentation

## 2019-01-25 DIAGNOSIS — R109 Unspecified abdominal pain: Secondary | ICD-10-CM

## 2019-01-25 DIAGNOSIS — Z3A01 Less than 8 weeks gestation of pregnancy: Secondary | ICD-10-CM | POA: Diagnosis not present

## 2019-01-25 DIAGNOSIS — O208 Other hemorrhage in early pregnancy: Secondary | ICD-10-CM | POA: Insufficient documentation

## 2019-01-25 LAB — COMPREHENSIVE METABOLIC PANEL
ALT: 10 U/L (ref 0–44)
AST: 15 U/L (ref 15–41)
Albumin: 3.4 g/dL — ABNORMAL LOW (ref 3.5–5.0)
Alkaline Phosphatase: 52 U/L (ref 38–126)
Anion gap: 6 (ref 5–15)
BUN: 7 mg/dL (ref 6–20)
CO2: 21 mmol/L — ABNORMAL LOW (ref 22–32)
Calcium: 9.2 mg/dL (ref 8.9–10.3)
Chloride: 107 mmol/L (ref 98–111)
Creatinine, Ser: 0.66 mg/dL (ref 0.44–1.00)
GFR calc Af Amer: >60 mL/min (ref >60)
GFR calc non Af Amer: >60 mL/min (ref >60)
Glucose, Bld: 96 mg/dL (ref 70–99)
Potassium: 3.8 mmol/L (ref 3.5–5.1)
Sodium: 134 mmol/L — ABNORMAL LOW (ref 135–145)
Total Bilirubin: 0.7 mg/dL (ref 0.3–1.2)
Total Protein: 6.7 g/dL (ref 6.5–8.1)

## 2019-01-25 LAB — URINALYSIS, ROUTINE W REFLEX MICROSCOPIC
Bilirubin Urine: NEGATIVE
Glucose, UA: NEGATIVE mg/dL
Hgb urine dipstick: NEGATIVE
Ketones, ur: NEGATIVE mg/dL
Nitrite: NEGATIVE
Protein, ur: NEGATIVE mg/dL
Specific Gravity, Urine: 1.015 (ref 1.005–1.030)
pH: 6 (ref 5.0–8.0)

## 2019-01-25 LAB — CBC WITH DIFFERENTIAL/PLATELET
Abs Immature Granulocytes: 0.03 10*3/uL (ref 0.00–0.07)
Basophils Absolute: 0 10*3/uL (ref 0.0–0.1)
Basophils Relative: 0 %
Eosinophils Absolute: 0 10*3/uL (ref 0.0–0.5)
Eosinophils Relative: 0 %
HCT: 35.7 % — ABNORMAL LOW (ref 36.0–46.0)
Hemoglobin: 11.6 g/dL — ABNORMAL LOW (ref 12.0–15.0)
Immature Granulocytes: 0 %
Lymphocytes Relative: 21 %
Lymphs Abs: 1.8 10*3/uL (ref 0.7–4.0)
MCH: 23.5 pg — ABNORMAL LOW (ref 26.0–34.0)
MCHC: 32.5 g/dL (ref 30.0–36.0)
MCV: 72.3 fL — ABNORMAL LOW (ref 80.0–100.0)
Monocytes Absolute: 0.6 10*3/uL (ref 0.1–1.0)
Monocytes Relative: 7 %
Neutro Abs: 6.2 10*3/uL (ref 1.7–7.7)
Neutrophils Relative %: 72 %
Platelets: 382 10*3/uL (ref 150–400)
RBC: 4.94 MIL/uL (ref 3.87–5.11)
RDW: 17.7 % — ABNORMAL HIGH (ref 11.5–15.5)
WBC: 8.7 10*3/uL (ref 4.0–10.5)
nRBC: 0 % (ref 0.0–0.2)

## 2019-01-25 LAB — HCG, QUANTITATIVE, PREGNANCY: hCG, Beta Chain, Quant, S: 90025 m[IU]/mL — ABNORMAL HIGH (ref <5)

## 2019-01-25 LAB — POCT PREGNANCY, URINE: Preg Test, Ur: POSITIVE — AB

## 2019-01-25 MED ORDER — PRENATAL VITAMINS 28-0.8 MG PO TABS: 1.0000 | ORAL_TABLET | Freq: Every day | ORAL | 3 refills | Status: DC

## 2019-01-25 NOTE — Discharge Instructions (Signed)
Subchorionic Hematoma ° °A subchorionic hematoma is a gathering of blood between the outer wall of the embryo (chorion) and the inner wall of the womb (uterus). °This condition can cause vaginal bleeding. If they cause little or no vaginal bleeding, early small hematomas usually shrink on their own and do not affect your baby or pregnancy. When bleeding starts later in pregnancy, or if the hematoma is larger or occurs in older pregnant women, the condition may be more serious. Larger hematomas may get bigger, which increases the chances of miscarriage. This condition also increases the risk of: °· Premature separation of the placenta from the uterus. °· Premature (preterm) labor. °· Stillbirth. °What are the causes? °The exact cause of this condition is not known. It occurs when blood is trapped between the placenta and the uterine wall because the placenta has separated from the original site of implantation. °What increases the risk? °You are more likely to develop this condition if: °· You were treated with fertility medicines. °· You conceived through in vitro fertilization (IVF). °What are the signs or symptoms? °Symptoms of this condition include: °· Vaginal spotting or bleeding. °· Contractions of the uterus. These cause abdominal pain. °Sometimes you may have no symptoms and the bleeding may only be seen when ultrasound images are taken (transvaginal ultrasound). °How is this diagnosed? °This condition is diagnosed based on a physical exam. This includes a pelvic exam. You may also have other tests, including: °· Blood tests. °· Urine tests. °· Ultrasound of the abdomen. °How is this treated? °Treatment for this condition can vary. Treatment may include: °· Watchful waiting. You will be monitored closely for any changes in bleeding. During this stage: °? The hematoma may be reabsorbed by the body. °? The hematoma may separate the fluid-filled space containing the embryo (gestational sac) from the wall of the  womb (endometrium). °· Medicines. °· Activity restriction. This may be needed until the bleeding stops. °Follow these instructions at home: °· Stay on bed rest if told to do so by your health care provider. °· Do not lift anything that is heavier than 10 lbs. (4.5 kg) or as told by your health care provider. °· Do not use any products that contain nicotine or tobacco, such as cigarettes and e-cigarettes. If you need help quitting, ask your health care provider. °· Track and write down the number of pads you use each day and how soaked (saturated) they are. °· Do not use tampons. °· Keep all follow-up visits as told by your health care provider. This is important. Your health care provider may ask you to have follow-up blood tests or ultrasound tests or both. °Contact a health care provider if: °· You have any vaginal bleeding. °· You have a fever. °Get help right away if: °· You have severe cramps in your stomach, back, abdomen, or pelvis. °· You pass large clots or tissue. Save any tissue for your health care provider to look at. °· You have more vaginal bleeding, and you faint or become lightheaded or weak. °Summary °· A subchorionic hematoma is a gathering of blood between the outer wall of the placenta and the uterus. °· This condition can cause vaginal bleeding. °· Sometimes you may have no symptoms and the bleeding may only be seen when ultrasound images are taken. °· Treatment may include watchful waiting, medicines, or activity restriction. °This information is not intended to replace advice given to you by your health care provider. Make sure you discuss any questions you   have with your health care provider. Document Released: 01/09/2007 Document Revised: 11/20/2016 Document Reviewed: 11/20/2016 Elsevier Interactive Patient Education  2019 [employer] Abdominal Pain During Pregnancy  Abdominal pain is common during pregnancy, and has many possible causes. Some causes are more serious than others,  and sometimes the cause is not known. Abdominal pain can be a sign that labor is starting. It can also be caused by normal growth and stretching of muscles and ligaments during pregnancy. Always tell your health care provider if you have any abdominal pain. Follow these instructions at home:  Do not have sex or put anything in your vagina until your pain goes away completely.  Get plenty of rest until your pain improves.  Drink enough fluid to keep your urine pale yellow.  Take over-the-counter and prescription medicines only as told by your health care provider.  Keep all follow-up visits as told by your health care provider. This is important. Contact a health care provider if:  Your pain continues or gets worse after resting.  You have lower abdominal pain that: ? Comes and goes at regular intervals. ? Spreads to your back. ? Is similar to menstrual cramps.  You have pain or burning when you urinate. Get help right away if:  You have a fever or chills.  You have vaginal bleeding.  You are leaking fluid from your vagina.  You are passing tissue from your vagina.  You have vomiting or diarrhea that lasts for more than 24 hours.  Your baby is moving less than usual.  You feel very weak or faint.  You have shortness of breath.  You develop severe pain in your upper abdomen. Summary  Abdominal pain is common during pregnancy, and has many possible causes.  If you experience abdominal pain during pregnancy, tell your health care provider right away.  Follow your health care provider's home care instructions and keep all follow-up visits as directed. This information is not intended to replace advice given to you by your health care provider. Make sure you discuss any questions you have with your health care provider. Document Released: 09/24/2005 Document Revised: 12/27/2016 Document Reviewed: 12/27/2016 Elsevier Interactive Patient Education  2019 [employer].

## 2019-01-25 NOTE — MAU Provider Note (Signed)
History     CSN: [id number]  Arrival date and time: 01/25/19 1101   First Provider Initiated Contact with Patient 01/25/19 1138      Chief Complaint  Patient presents with  . Abdominal Pain   HPI   Heather Riggs is a 28 y.o. female [id number] @ [redacted]w[redacted]d here with bilateral lower abdominal cramping that started 2 days ago. The cramping comes and goes. The cramping now she rates 6/10. She took tylenol yesterday which did not help at all. She took 325 mg PO. She denies bleeding at this time.   OB History    Gravida  3   Para  1   Term  1   Preterm  0   AB  1   Living  1     SAB  1   TAB  0   Ectopic  0   Multiple  0   Live Births  1           Past Medical History:  Diagnosis Date  . Pregnancy induced hypertension    gHTN; no meds  . Vaginal Pap smear, abnormal     Past Surgical History:  Procedure Laterality Date  . BLADDER SURGERY     28 years old  . DILATION AND EVACUATION N/A 07/21/2018   Procedure: SUCTION DILATATION AND EVACUATION;  Surgeon: Essie Hart, MD;  Location: [facility];  Service: Gynecology;  Laterality: N/A;    History reviewed. No pertinent family history.  Social History   Tobacco Use  . Smoking status: Never Smoker  . Smokeless tobacco: Never Used  Substance Use Topics  . Alcohol use: No  . Drug use: No    Allergies:  Allergies  Allergen Reactions  . Penicillins Swelling and Other (See Comments)    Reaction:  Facial swelling  Has patient had a PCN reaction causing immediate rash, facial/tongue/throat swelling, SOB or lightheadedness with hypotension: Yes Has patient had a PCN reaction causing severe rash involving mucus membranes or skin necrosis: No Has patient had a PCN reaction that required hospitalization No Has patient had a PCN reaction occurring within the last 10 years: Yes If all of the above answers are "NO", then may proceed with Cephalosporin use.    Medications Prior to Admission   Medication Sig Dispense Refill Last Dose  . ibuprofen (ADVIL,MOTRIN) 800 MG tablet Take 1 tablet (800 mg total) by mouth every 8 (eight) hours as needed for cramping. 30 tablet 0 Past Month at Unknown time  . benzonatate (TESSALON) 100 MG capsule Take 1 capsule (100 mg total) by mouth 3 (three) times daily as needed for cough. 21 capsule 0 Unknown at Unknown time  . HYDROcodone-acetaminophen (NORCO/VICODIN) 5-325 MG tablet Take 1 tablet by mouth every 6 (six) hours as needed for moderate pain. 20 tablet 0 More than a month at Unknown time   Results for orders placed or performed during the hospital encounter of 01/25/19 (from the past 48 hour(s))  Urinalysis, Routine w reflex microscopic     Status: Abnormal   Collection Time: 01/25/19 11:18 AM  Result Value Ref Range   Color, Urine YELLOW YELLOW   APPearance HAZY (A) CLEAR   Specific Gravity, Urine 1.015 1.005 - 1.030   pH 6.0 5.0 - 8.0   Glucose, UA NEGATIVE NEGATIVE mg/dL   Hgb urine dipstick NEGATIVE NEGATIVE   Bilirubin Urine NEGATIVE NEGATIVE   Ketones, ur NEGATIVE NEGATIVE mg/dL   Protein, ur NEGATIVE NEGATIVE mg/dL   Nitrite NEGATIVE NEGATIVE  Leukocytes,Ua LARGE (A) NEGATIVE   RBC / HPF 0-5 0 - 5 RBC/hpf   WBC, UA 6-10 0 - 5 WBC/hpf   Bacteria, UA RARE (A) NONE SEEN   Squamous Epithelial / LPF 6-10 0 - 5    Comment: Performed at [facility], [address], [place], [place] [address]  Pregnancy, urine POC     Status: Abnormal   Collection Time: 01/25/19 11:19 AM  Result Value Ref Range   Preg Test, Ur POSITIVE (A) NEGATIVE    Comment:        THE SENSITIVITY OF THIS METHODOLOGY IS >24 mIU/mL   ABO/Rh     Status: None   Collection Time: 01/25/19 12:04 PM  Result Value Ref Range   ABO/RH(D)      O POS Performed at [facility], [address], [place], [place] [address]   CBC with Differential/Platelet     Status: Abnormal   Collection Time: 01/25/19 12:05 PM  Result Value Ref Range   WBC 8.7 4.0 -  10.5 K/uL   RBC 4.94 3.87 - 5.11 MIL/uL   Hemoglobin 11.6 (L) 12.0 - 15.0 g/dL   HCT 96.2 (L) 95.2 - 84.1 %   MCV 72.3 (L) 80.0 - 100.0 fL   MCH 23.5 (L) 26.0 - 34.0 pg   MCHC 32.5 30.0 - 36.0 g/dL   RDW 32.4 (H) 40.1 - 02.7 %   Platelets 382 150 - 400 K/uL   nRBC 0.0 0.0 - 0.2 %   Neutrophils Relative % 72 %   Neutro Abs 6.2 1.7 - 7.7 K/uL   Lymphocytes Relative 21 %   Lymphs Abs 1.8 0.7 - 4.0 K/uL   Monocytes Relative 7 %   Monocytes Absolute 0.6 0.1 - 1.0 K/uL   Eosinophils Relative 0 %   Eosinophils Absolute 0.0 0.0 - 0.5 K/uL   Basophils Relative 0 %   Basophils Absolute 0.0 0.0 - 0.1 K/uL   Immature Granulocytes 0 %   Abs Immature Granulocytes 0.03 0.00 - 0.07 K/uL    Comment: Performed at [facility], [address] [place], [place] [address]  Comprehensive metabolic panel     Status: Abnormal   Collection Time: 01/25/19 12:05 PM  Result Value Ref Range   Sodium 134 (L) 135 - 145 mmol/L   Potassium 3.8 3.5 - 5.1 mmol/L   Chloride 107 98 - 111 mmol/L   CO2 21 (L) 22 - 32 mmol/L   Glucose, Bld 96 70 - 99 mg/dL   BUN 7 6 - 20 mg/dL   Creatinine, Ser 4.40 0.44 - 1.00 mg/dL   Calcium 9.2 8.9 - 34.7 mg/dL   Total Protein 6.7 6.5 - 8.1 g/dL   Albumin 3.4 (L) 3.5 - 5.0 g/dL   AST 15 15 - 41 U/L   ALT 10 0 - 44 U/L   Alkaline Phosphatase 52 38 - 126 U/L   Total Bilirubin 0.7 0.3 - 1.2 mg/dL   GFR calc non Af Amer >60 >60 mL/min   GFR calc Af Amer >60 >60 mL/min   Anion gap 6 5 - 15    Comment: Performed at [facility], [address], [place], [place] [address]  hCG, quantitative, pregnancy     Status: Abnormal   Collection Time: 01/25/19 12:05 PM  Result Value Ref Range   hCG, Beta Chain, Quant, S 90,025 (H) <5 mIU/mL    Comment:  GEST. AGE      CONC.  (mIU/mL)   <=1 WEEK        5 - 50     2 WEEKS       50 - 500     3 WEEKS       100 - 10,000     4 WEEKS     1,000 - 30,000     5 WEEKS     3,500 - 115,000   6-8 WEEKS     12,000 - 270,000     12 WEEKS     15,000 - 220,000        FEMALE AND NON-PREGNANT FEMALE:     LESS THAN 5 mIU/mL Performed at [facility], [address] [place], [place] [address]    US Ob Less Than 14 Weeks With Ob Transvaginal  Result Date: 01/25/2019 CLINICAL DATA:  Abdominal pain. EXAM: OBSTETRIC <14 WK [place] AND TRANSVAGINAL OB US TECHNIQUE: Both transabdominal and transvaginal ultrasound examinations were performed for complete evaluation of the gestation as well as the maternal uterus, adnexal regions, and pelvic cul-de-sac. Transvaginal technique was performed to assess early pregnancy. COMPARISON:  None. FINDINGS: Intrauterine gestational sac: Single Yolk sac:  Visualized. Embryo:  Visualized. Cardiac Activity: Visualized. Heart Rate: 145 bpm MSD:   mm    w     d CRL:  8.7 mm   6 w   5 d                  [place] [facility]: September 15, 2019 Subchorionic hemorrhage: There is a small subchorionic hemorrhage seen. Maternal uterus/adnexae: The ovaries are normal in appearance. There is a corpus luteum cyst on the right. Trace fluid in the cul-de-sac is likely physiologic. IMPRESSION: 1. Single live IUP.  Small subchorionic hemorrhage. Electronically Signed   By: Gerome Sam III M.D   On: 01/25/2019 12:50    Review of Systems  Constitutional: Negative for fever.  Gastrointestinal: Positive for abdominal pain.  Genitourinary: Negative for dysuria, frequency, vaginal bleeding and vaginal discharge.   Physical Exam   Blood pressure 126/66, pulse 92, temperature 98.4 F (36.9 C), temperature source Oral, resp. rate 16, height 6' (1.829 m), weight 116.3 kg, last menstrual period 12/07/2018, SpO2 98 %, unknown if currently breastfeeding.  Physical Exam  Constitutional: She is oriented to person, place, and time. She appears well-developed and well-nourished. No distress.  HENT:  Head: Normocephalic.  Eyes: Pupils are equal, round, and reactive to light.  GI: Soft. Normal appearance. There is abdominal tenderness in  the suprapubic area. There is no rigidity, no rebound and no guarding.  Genitourinary:    Genitourinary Comments: Wet prep and GC collected without speculum Bimanual exam: cervix anterior, closed. +Suprapubic pain with palpation.    Neurological: She is alert and oriented to person, place, and time.  Skin: Skin is warm. She is not diaphoretic.  Psychiatric: Her behavior is normal.   MAU Course  Procedures  Now  MDM  Wet prep & GC HIV, CBC, Hcg, ABO US OB transvaginal  O positive blood type.  Lab unable to locate Wet prep and GC after it was sent. GC off of urine.  Discussed subchorionic hemorrhage in detail with patient.   Assessment and Plan   A:  SIUP @ [redacted]w[redacted]d 1. Abdominal pain in pregnancy, first trimester   2. Subchorionic hemorrhage of placenta in first trimester, single or unspecified fetus     P:  Discharge home in stable  condition Return to MAU if symptoms worsen Ok to use tylenol OTC as directed on the bottle Start prenatal care Rx: prenatal vitamins   Chevy Sweigert, Harolyn RutherfordJennifer I, NP 01/25/2019 5:41 PM

## 2019-01-25 NOTE — MAU Note (Signed)
Heather Riggs is a 28 y.o. at [redacted]w[redacted]d here in MAU reporting: + UPT at home on 01/08/19, has been having some cramping, had some and then it went away and then it started back 2 days ago and it has not gone away. Had some spotting last week but none today, no abnormal vaginal discharge  LMP: 12/07/18  Onset of complaint: 2 days ago  Pain score: 7/10  Vitals:   01/25/19 1123  BP: 126/66  Pulse: 92  Resp: 16  Temp: 98.4 F (36.9 C)  SpO2: 98%      Lab orders placed from triage: ua, upt .

## 2019-01-26 LAB — GC/CHLAMYDIA PROBE AMP (~~LOC~~) NOT AT ARMC
Chlamydia: NEGATIVE
Chlamydia: NEGATIVE
Neisseria Gonorrhea: NEGATIVE
Neisseria Gonorrhea: NEGATIVE

## 2019-01-26 LAB — HIV ANTIBODY (ROUTINE TESTING W REFLEX): HIV Screen 4th Generation wRfx: NONREACTIVE

## 2019-01-26 LAB — ABO/RH: ABO/RH(D): O POS

## 2019-02-04 ENCOUNTER — Encounter (HOSPITAL_COMMUNITY): Payer: Self-pay | Admitting: *Deleted

## 2019-02-04 ENCOUNTER — Other Ambulatory Visit: Payer: Self-pay

## 2019-02-04 ENCOUNTER — Ambulatory Visit: Payer: Self-pay

## 2019-02-04 ENCOUNTER — Inpatient Hospital Stay (HOSPITAL_COMMUNITY)
Admission: AD | Admit: 2019-02-04 | Discharge: 2019-02-04 | Disposition: A | Discharge status: Home / Self Care | Payer: Medicaid Other | Attending: Obstetrics and Gynecology | Admitting: Obstetrics and Gynecology

## 2019-02-04 ENCOUNTER — Inpatient Hospital Stay (HOSPITAL_COMMUNITY): Payer: Medicaid Other

## 2019-02-04 DIAGNOSIS — O9989 Other specified diseases and conditions complicating pregnancy, childbirth and the puerperium: Secondary | ICD-10-CM | POA: Diagnosis not present

## 2019-02-04 DIAGNOSIS — Z3A08 8 weeks gestation of pregnancy: Secondary | ICD-10-CM | POA: Insufficient documentation

## 2019-02-04 DIAGNOSIS — R109 Unspecified abdominal pain: Secondary | ICD-10-CM | POA: Diagnosis not present

## 2019-02-04 DIAGNOSIS — O209 Hemorrhage in early pregnancy, unspecified: Secondary | ICD-10-CM | POA: Insufficient documentation

## 2019-02-04 DIAGNOSIS — O4691 Antepartum hemorrhage, unspecified, first trimester: Secondary | ICD-10-CM

## 2019-02-04 DIAGNOSIS — N939 Abnormal uterine and vaginal bleeding, unspecified: Secondary | ICD-10-CM

## 2019-02-04 DIAGNOSIS — Z880 Allergy status to penicillin: Secondary | ICD-10-CM | POA: Insufficient documentation

## 2019-02-04 DIAGNOSIS — O161 Unspecified maternal hypertension, first trimester: Secondary | ICD-10-CM | POA: Diagnosis not present

## 2019-02-04 NOTE — Telephone Encounter (Signed)
Pt called stating she was pregnant and bleeding vaginally. Pt directed to nearest ER for evaluation. No triage done

## 2019-02-04 NOTE — Discharge Instructions (Signed)
First Trimester of Pregnancy  The first trimester of pregnancy is from week 1 until the end of week 13 (months 1 through 3). During this time, your baby will begin to develop inside you. At 6-8 weeks, the eyes and face are formed, and the heartbeat can be seen on ultrasound. At the end of 12 weeks, all the baby's organs are formed. Prenatal care is all the medical care you receive before the birth of your baby. Make sure you get good prenatal care and follow all of your doctor's instructions. Follow these instructions at home: Medicines  Take over-the-counter and prescription medicines only as told by your doctor. Some medicines are safe and some medicines are not safe during pregnancy.  Take a prenatal vitamin that contains at least 600 micrograms (mcg) of folic acid.  If you have trouble pooping (constipation), take medicine that will make your stool soft (stool softener) if your doctor approves. Eating and drinking   Eat regular, healthy meals.  Your doctor will tell you the amount of weight gain that is right for you.  Avoid raw meat and uncooked cheese.  If you feel sick to your stomach (nauseous) or throw up (vomit): ? Eat 4 or 5 small meals a day instead of 3 large meals. ? Try eating a few soda crackers. ? Drink liquids between meals instead of during meals.  To prevent constipation: ? Eat foods that are high in fiber, like fresh fruits and vegetables, whole grains, and beans. ? Drink enough fluids to keep your pee (urine) clear or pale yellow. Activity  Exercise only as told by your doctor. Stop exercising if you have cramps or pain in your lower belly (abdomen) or low back.  Do not exercise if it is too hot, too humid, or if you are in a place of great height (high altitude).  Try to avoid standing for long periods of time. Move your legs often if you must stand in one place for a long time.  Avoid heavy lifting.  Wear low-heeled shoes. Sit and stand up straight.   You can have sex unless your doctor tells you not to. Relieving pain and discomfort  Wear a good support bra if your breasts are sore.  Take warm water baths (sitz baths) to soothe pain or discomfort caused by hemorrhoids. Use hemorrhoid cream if your doctor says it is okay.  Rest with your legs raised if you have leg cramps or low back pain.  If you have puffy, bulging veins (varicose veins) in your legs: ? Wear support hose or compression stockings as told by your doctor. ? Raise (elevate) your feet for 15 minutes, 3-4 times a day. ? Limit salt in your food. Prenatal care  Schedule your prenatal visits by the twelfth week of pregnancy.  Write down your questions. Take them to your prenatal visits.  Keep all your prenatal visits as told by your doctor. This is important. Safety  Wear your seat belt at all times when driving.  Make a list of emergency phone numbers. The list should include numbers for family, friends, the hospital, and police and fire departments. General instructions  Ask your doctor for a referral to a local prenatal class. Begin classes no later than at the start of month 6 of your pregnancy.  Ask for help if you need counseling or if you need help with nutrition. Your doctor can give you advice or tell you where to go for help.  Do not use hot tubs, steam   rooms, or saunas.  Do not douche or use tampons or scented sanitary pads.  Do not cross your legs for long periods of time.  Avoid all herbs and alcohol. Avoid drugs that are not approved by your doctor.  Do not use any tobacco products, including cigarettes, chewing tobacco, and electronic cigarettes. If you need help quitting, ask your doctor. You may get counseling or other support to help you quit.  Avoid cat litter boxes and soil used by cats. These carry germs that can cause birth defects in the baby and can cause a loss of your baby (miscarriage) or stillbirth.  Visit your dentist. At home,  brush your teeth with a soft toothbrush. Be gentle when you floss. Contact a doctor if:  You are dizzy.  You have mild cramps or pressure in your lower belly.  You have a nagging pain in your belly area.  You continue to feel sick to your stomach, you throw up, or you have watery poop (diarrhea).  You have a bad smelling fluid coming from your vagina.  You have pain when you pee (urinate).  You have increased puffiness (swelling) in your face, hands, legs, or ankles. Get help right away if:  You have a fever.  You are leaking fluid from your vagina.  You have spotting or bleeding from your vagina.  You have very bad belly cramping or pain.  You gain or lose weight rapidly.  You throw up blood. It may look like coffee grounds.  You are around people who have German measles, fifth disease, or chickenpox.  You have a very bad headache.  You have shortness of breath.  You have any kind of trauma, such as from a fall or a car accident. Summary  The first trimester of pregnancy is from week 1 until the end of week 13 (months 1 through 3).  To take care of yourself and your unborn baby, you will need to eat healthy meals, take medicines only if your doctor tells you to do so, and do activities that are safe for you and your baby.  Keep all follow-up visits as told by your doctor. This is important as your doctor will have to ensure that your baby is healthy and growing well. This information is not intended to replace advice given to you by your health care provider. Make sure you discuss any questions you have with your health care provider. Document Released: 03/12/2008 Document Revised: 10/02/2016 Document Reviewed: 10/02/2016 Elsevier Interactive Patient Education  2019 [employer]vier Inc.  

## 2019-02-04 NOTE — MAU Provider Note (Addendum)
Patient Heather Riggs is a 28 y.o. G3P1011 At [redacted]w[redacted]d here with complaints of vaginal bleeding and cramping. She denies SOB, fever, pain with urination, nausea/vomiting, dizziness or body aches.   She was seen in MAU on 4-19 with abdominal pain; she was diagnosed with [facility] and a living IUP. GC was negative (off of urine).  History     CSN: [id number]  Arrival date and time: 02/04/19 1257   First Provider Initiated Contact with Patient 02/04/19 1359      Chief Complaint  Patient presents with  . Vaginal Bleeding  . Abdominal Pain   Vaginal Bleeding  The patient's primary symptoms include vaginal bleeding. This is a new problem. The current episode started today. The problem occurs intermittently. The problem has been unchanged. The pain is mild. Associated symptoms include abdominal pain. Pertinent negatives include no constipation, diarrhea, dysuria, fever, urgency or vomiting. The vaginal discharge was bloody. The vaginal bleeding is lighter than menses (between a light and a regular period.  The blood is dark brown color.Marland Kitchen). She has not been passing clots. She has not been passing tissue. Nothing aggravates the symptoms. She has tried nothing for the symptoms.    OB History    Gravida  3   Para  1   Term  1   Preterm  0   AB  1   Living  1     SAB  1   TAB  0   Ectopic  0   Multiple  0   Live Births  1           Past Medical History:  Diagnosis Date  . Pregnancy induced hypertension    gHTN; no meds  . Vaginal Pap smear, abnormal     Past Surgical History:  Procedure Laterality Date  . BLADDER SURGERY     28 years old  . DILATION AND EVACUATION N/A 07/21/2018   Procedure: SUCTION DILATATION AND EVACUATION;  Surgeon: Essie Hart, MD;  Location[facility];  Service: Gynecology;  Laterality: N/A;    No family history on file.  Social History   Tobacco Use  . Smoking status: Never Smoker  . Smokeless tobacco: Never Used  Substance  Use Topics  . Alcohol use: No  . Drug use: No    Allergies:  Allergies  Allergen Reactions  . Penicillins Swelling and Other (See Comments)    Reaction:  Facial swelling  Has patient had a PCN reaction causing immediate rash, facial/tongue/throat swelling, SOB or lightheadedness with hypotension: Yes Has patient had a PCN reaction causing severe rash involving mucus membranes or skin necrosis: No Has patient had a PCN reaction that required hospitalization No Has patient had a PCN reaction occurring within the last 10 years: Yes If all of the above answers are "NO", then may proceed with Cephalosporin use.    Medications Prior to Admission  Medication Sig Dispense Refill Last Dose  . Prenatal Vit-Fe Fumarate-FA (PRENATAL VITAMINS) 28-0.8 MG TABS Take 1 tablet by mouth daily. 60 tablet 3     Review of Systems  Constitutional: Negative for fever.  Gastrointestinal: Positive for abdominal pain. Negative for constipation, diarrhea and vomiting.  Genitourinary: Positive for vaginal bleeding. Negative for dysuria and urgency.   Physical Exam   Blood pressure 112/70, pulse 84, temperature 98.4 F (36.9 C), temperature source Oral, resp. rate 16, height 6' (1.829 m), weight 116.8 kg, last menstrual period 12/07/2018, SpO2 100 %, unknown if currently breastfeeding.  Physical Exam  Constitutional: She is oriented to person, place, and time. She appears well-developed.  HENT:  Head: Normocephalic.  Neck: Normal range of motion.  Respiratory: Effort normal.  GI: Soft. She exhibits no distension. There is no abdominal tenderness. There is no rebound.  Genitourinary:    Genitourinary Comments: NEFG; no blood or mucous in the vagina. Cervix is visually closed;  No suprapubic or adnexal tenderness.    Musculoskeletal: Normal range of motion.  Neurological: She is alert and oriented to person, place, and time.  Skin: Skin is warm and dry.  Psychiatric: She has a normal mood and affect.     MAU Course  Procedures  MDM Blood type on file is O pos -transvaginal US to assess fetal viability; viable SIUP with cardiac activity. [facility] now no longer seen on US.  -vaginal cultures not repeated as patient just had swabs done and they were negative.  Assessment and Plan   1. Vaginal bleeding    2. Reassured patient of fetal status and [facility]   3. Advised rest, heat, Tylenol for cramps.  4. Reviewed warning signs and when to return to the MAU. All questions answered.    Charlesetta GaribaldiKathryn Lorraine Kooistra 02/04/2019, 2:09 PM

## 2019-02-04 NOTE — MAU Note (Signed)
Noted a little bit of bleeding when went to the bathroom this morning, when went a little while ago- it was covering her underwear.  First episode of bleeding. Little bit of pain on the RLQ, ongoing thing.

## 2019-03-05 ENCOUNTER — Other Ambulatory Visit: Payer: Self-pay

## 2019-03-05 ENCOUNTER — Encounter (HOSPITAL_COMMUNITY): Payer: Self-pay | Admitting: Emergency Medicine

## 2019-03-05 ENCOUNTER — Emergency Department (HOSPITAL_COMMUNITY)
Admission: EM | Admit: 2019-03-05 | Discharge: 2019-03-05 | Disposition: A | Discharge status: Home / Self Care | Payer: Medicaid Other | Attending: Emergency Medicine | Admitting: Emergency Medicine

## 2019-03-05 DIAGNOSIS — K029 Dental caries, unspecified: Secondary | ICD-10-CM | POA: Insufficient documentation

## 2019-03-05 DIAGNOSIS — Z3A12 12 weeks gestation of pregnancy: Secondary | ICD-10-CM | POA: Diagnosis not present

## 2019-03-05 DIAGNOSIS — O26891 Other specified pregnancy related conditions, first trimester: Secondary | ICD-10-CM | POA: Diagnosis not present

## 2019-03-05 MED ORDER — CLINDAMYCIN HCL 150 MG PO CAPS: 150.0000 mg | ORAL_CAPSULE | Freq: Four times a day (QID) | ORAL | 0 refills | Status: DC

## 2019-03-05 MED ORDER — ACETAMINOPHEN 500 MG PO TABS: 500.0000 mg | ORAL_TABLET | Freq: Four times a day (QID) | ORAL | 0 refills | Status: AC | PRN

## 2019-03-05 MED ORDER — ACETAMINOPHEN 500 MG PO TABS: 500.0000 mg | ORAL_TABLET | Freq: Four times a day (QID) | ORAL | 0 refills | Status: DC | PRN

## 2019-03-05 NOTE — ED Provider Notes (Signed)
[facility] EMERGENCY DEPARTMENT Provider Note   CSN: [id number] Arrival date & time: 03/05/19  6301    History   Chief Complaint Chief Complaint  Patient presents with  . Dental Pain    HPI Heather Riggs is a 28 y.o. female.     The history is provided by the patient. No language interpreter was used.  Dental Pain  Associated symptoms: no fever      28 year old female presenting with dental pain.  Patient report for the past 3 days she has had pain to her right lower molar.  She described pain as a throbbing achy sensation, moderate in severity, 7 out of 10, worsening with chewing and with temperature changes.  Today she noticed swelling to the right side of her face with increasing pain.  She does not complain of any fever, productive cough, neck pain, trouble swallowing, chest pain.  She has been taking Tylenol at home with minimal improvement.  She is currently [redacted] weeks pregnant.  She is allergic to penicillin.  She denies any hearing changes or loss of hearing.  No ear pain.  Past Medical History:  Diagnosis Date  . Pregnancy induced hypertension    gHTN; no meds  . Vaginal Pap smear, abnormal     Patient Active Problem List   Diagnosis Date Noted  . Status post dilatation and curettage 07/21/2018  . Labor and delivery, indication for care 02/06/2017    Past Surgical History:  Procedure Laterality Date  . BLADDER SURGERY     28 years old  . DILATION AND EVACUATION N/A 07/21/2018   Procedure: SUCTION DILATATION AND EVACUATION;  Surgeon: Essie Hart, MD;  Location: [facility] ;  Service: Gynecology;  Laterality: N/A;     OB History    Gravida  3   Para  1   Term  1   Preterm  0   AB  1   Living  1     SAB  1   TAB  0   Ectopic  0   Multiple  0   Live Births  1            Home Medications    Prior to Admission medications   Medication Sig Start Date End Date Taking? Authorizing Provider  Prenatal  Vit-Fe Fumarate-FA (PRENATAL VITAMINS) 28-0.8 MG TABS Take 1 tablet by mouth daily. 01/25/19   Heather Riggs    Family History No family history on file.  Social History Social History   Tobacco Use  . Smoking status: Never Smoker  . Smokeless tobacco: Never Used  Substance Use Topics  . Alcohol use: No  . Drug use: No     Allergies   Penicillins   Review of Systems Review of Systems  Constitutional: Negative for fever.  HENT: Positive for dental problem.      Physical Exam Updated Vital Signs BP 113/71 (BP Location: Right Arm)   Pulse 91   Temp 98.8 F (37.1 C) (Oral)   Resp 15   Ht 6' (1.829 m)   Wt 97.5 kg   LMP 12/07/2018   SpO2 100%   BMI 29.16 kg/m   Physical Exam Vitals signs and nursing note reviewed.  Constitutional:      General: She is not in acute distress.    Appearance: She is well-developed.  HENT:     Head: Atraumatic.     Mouth/Throat:     Comments: Dental decay noted to  tooth #32 with tenderness on percussion.  No surrounding gingival erythema or edema.  Mild trismus was noted.  No obvious facial swelling appreciated.  Throat exam, uvula midline no tonsillar enlargement or exudates. Eyes:     Conjunctiva/sclera: Conjunctivae normal.  Neck:     Musculoskeletal: Neck supple.  Skin:    Findings: No rash.  Neurological:     Mental Status: She is alert.      ED Treatments / Results  Labs (all labs ordered are listed, but only abnormal results are displayed) Labs Reviewed - No data to display  EKG None  Radiology No results found.  Procedures Procedures (including critical care time)  Medications Ordered in ED Medications - No data to display   Initial Impression / Assessment and Plan / ED Course  I have reviewed the triage vital signs and the nursing notes.  Pertinent labs & imaging results that were available during my care of the patient were reviewed by me and considered in my medical decision making (see  chart for details).        BP 113/71 (BP Location: Right Arm)   Pulse 91   Temp 98.8 F (37.1 C) (Oral)   Resp 15   Ht 6' (1.829 m)   Wt 97.5 kg   LMP 12/07/2018   SpO2 100%   BMI 29.16 kg/m    Final Clinical Impressions(s) / ED Diagnoses   Final diagnoses:  Pain due to dental caries    ED Discharge Orders         Ordered    clindamycin (CLEOCIN) 150 MG capsule  Every 6 hours     03/05/19 0742    acetaminophen (TYLENOL) 500 MG tablet  Every 6 hours PRN     03/05/19 0742         7:41 AM Patient here with complaints of dental pain.  Suspect dental infection.  She is currently pregnant and is also allergic to penicillin therefore will treat with clindamycin and dental referral.  No obvious sign of deep tissue infection on exam although she does have some mild trismus.  Encourage patient to return promptly if her condition worsen.   Fayrene Helperran, Harlynn Kimbell, PA-C 03/05/19 [phone]    Geoffery Lyonselo, [place], MD 03/05/19 [phone]

## 2019-03-05 NOTE — ED Triage Notes (Signed)
Pt states she has a right lower molar that is chipped and is hurting her. The past few days she has noticed right sided facial swelling and pain.

## 2019-03-05 NOTE — Discharge Instructions (Signed)
Take antibiotic as prescribed.  Call and follow up with dentist for further care.

## 2019-04-11 ENCOUNTER — Other Ambulatory Visit: Payer: Self-pay

## 2019-04-11 ENCOUNTER — Inpatient Hospital Stay (HOSPITAL_COMMUNITY)
Admission: AD | Admit: 2019-04-11 | Discharge: 2019-04-11 | Disposition: A | Discharge status: Home / Self Care | Payer: Medicaid Other | Attending: Obstetrics & Gynecology | Admitting: Obstetrics & Gynecology

## 2019-04-11 ENCOUNTER — Encounter (HOSPITAL_COMMUNITY): Payer: Self-pay

## 2019-04-11 DIAGNOSIS — Z3A17 17 weeks gestation of pregnancy: Secondary | ICD-10-CM | POA: Diagnosis not present

## 2019-04-11 DIAGNOSIS — Z880 Allergy status to penicillin: Secondary | ICD-10-CM | POA: Insufficient documentation

## 2019-04-11 DIAGNOSIS — R12 Heartburn: Secondary | ICD-10-CM | POA: Insufficient documentation

## 2019-04-11 DIAGNOSIS — O99612 Diseases of the digestive system complicating pregnancy, second trimester: Secondary | ICD-10-CM | POA: Insufficient documentation

## 2019-04-11 DIAGNOSIS — O26892 Other specified pregnancy related conditions, second trimester: Secondary | ICD-10-CM | POA: Diagnosis not present

## 2019-04-11 DIAGNOSIS — R079 Chest pain, unspecified: Secondary | ICD-10-CM | POA: Diagnosis present

## 2019-04-11 MED ORDER — FAMOTIDINE 20 MG PO TABS: 20.0000 mg | ORAL_TABLET | Freq: Two times a day (BID) | ORAL | 0 refills | Status: DC | PRN

## 2019-04-11 MED ORDER — LIDOCAINE VISCOUS HCL 2 % MT SOLN
15.0000 mL | Freq: Once | OROMUCOSAL | Status: AC
  Administered 2019-04-11: 15 mL via ORAL
  Filled 2019-04-11: qty 15

## 2019-04-11 MED ORDER — ALUM & MAG HYDROXIDE-SIMETH 200-200-20 MG/5ML PO SUSP
30.0000 mL | Freq: Once | ORAL | Status: AC
  Administered 2019-04-11: 30 mL via ORAL
  Filled 2019-04-11: qty 30

## 2019-04-11 NOTE — MAU Note (Addendum)
Patient presents to MAU c/o of upper right chest that radiates down to bottom of stomach. Patient states it started about 2 days ago. Patient states its a "burning" pain.  Patient reports taking tylenol yesterday and it didn't help.  Patient denies vaginal bleeding/discharge.  Patient states she hasn't felt baby move yet.

## 2019-04-11 NOTE — MAU Provider Note (Signed)
Chief Complaint: Chest Pain and Abdominal Pain   First Provider Initiated Contact with Patient 04/11/19 2142      SUBJECTIVE HPI: Heather Riggs is a 28 y.o. G3P1011 at 717w6d who presents to maternity admissions reporting burning chest pain x 2 days that radiates down into her abdomen. She reports the pain is burning in her mid chest, causes her to be uncomfortable standing and need to sit down. She has tried Tylenol but it has not helped. She denies any hx of acid reflux prior to pregnancy.  She denies any cardiac or pulmonary medical hx.  There are no other symptoms.  She has not tried any other treatments. She denies vaginal bleeding, vaginal itching/burning, urinary symptoms, h/a, dizziness, n/v, or fever/chills.     HPI  Past Medical History:  Diagnosis Date  . Pregnancy induced hypertension    gHTN; no meds  . Vaginal Pap smear, abnormal    Past Surgical History:  Procedure Laterality Date  . BLADDER SURGERY     28 years old  . DILATION AND EVACUATION N/A 07/21/2018   Procedure: SUCTION DILATATION AND EVACUATION;  Surgeon: Essie HartPinn, Walda, MD;  [facility];  Service: Gynecology;  Laterality: N/A;   Social History   Socioeconomic History  . Marital status: Married    Spouse name: Not on file  . Number of children: Not on file  . Years of education: Not on file  . Highest education level: Not on file  Occupational History  . Not on file  Social Needs  . Financial resource strain: Not on file  . Food insecurity    Worry: Not on file    Inability: Not on file  . Transportation needs    Medical: Not on file    Non-medical: Not on file  Tobacco Use  . Smoking status: Never Smoker  . Smokeless tobacco: Never Used  Substance and Sexual Activity  . Alcohol use: No  . Drug use: No  . Sexual activity: Yes    Birth control/protection: None  Lifestyle  . Physical activity    Days per week: Not on file    Minutes per session: Not on file  .  Stress: Not on file  Relationships  . Social Musicianconnections    Talks on phone: Not on file    Gets together: Not on file    Attends religious service: Not on file    Active member of club or organization: Not on file    Attends meetings of clubs or organizations: Not on file    Relationship status: Not on file  . Intimate partner violence    Fear of current or ex partner: Not on file    Emotionally abused: Not on file    Physically abused: Not on file    Forced sexual activity: Not on file  Other Topics Concern  . Not on file  Social History Narrative  . Not on file   No current facility-administered medications on file prior to encounter.    Current Outpatient Medications on File Prior to Encounter  Medication Sig Dispense Refill  . ferrous sulfate 325 (65 FE) MG tablet Take 325 mg by mouth daily with breakfast.    . acetaminophen (TYLENOL) 500 MG tablet Take 1 tablet (500 mg total) by mouth every 6 (six) hours as needed. 30 tablet 0  . clindamycin (CLEOCIN) 150 MG capsule Take 1 capsule (150 mg total) by mouth every 6 (six) hours. 28 capsule 0  . Prenatal Vit-Fe  Fumarate-FA (PRENATAL VITAMINS) 28-0.8 MG TABS Take 1 tablet by mouth daily. 60 tablet 3   Allergies  Allergen Reactions  . Penicillins Swelling and Other (See Comments)    Reaction:  Facial swelling  Has patient had a PCN reaction causing immediate rash, facial/tongue/throat swelling, SOB or lightheadedness with hypotension: Yes Has patient had a PCN reaction causing severe rash involving mucus membranes or skin necrosis: No Has patient had a PCN reaction that required hospitalization No Has patient had a PCN reaction occurring within the last 10 years: Yes If all of the above answers are "NO", then may proceed with Cephalosporin use.    ROS:  Review of Systems  Constitutional: Negative for chills, fatigue and fever.  Respiratory: Negative for shortness of breath.   Cardiovascular: Positive for chest pain.   Gastrointestinal: Positive for abdominal pain. Negative for nausea and vomiting.  Genitourinary: Negative for difficulty urinating, dysuria, flank pain, pelvic pain, vaginal bleeding, vaginal discharge and vaginal pain.  Neurological: Negative for dizziness and headaches.  Psychiatric/Behavioral: Negative.      I have reviewed patient's Past Medical Hx, Surgical Hx, Family Hx, Social Hx, medications and allergies.   Physical Exam   Patient Vitals for the past 24 hrs:  BP Temp Temp src Pulse Resp SpO2 Height  04/11/19 2132 120/67 - - 88 18 - -  04/11/19 2119 118/70 98.6 F (37 C) Oral 89 18 99 % 6' (1.829 m)   Constitutional: Well-developed, well-nourished female in no acute distress.  HEART: normal rate, heart sounds, regular rhythm RESP: normal effort, lung sounds clear and equal bilaterally GI: Abd soft, non-tender. Pos BS x 4 MS: Extremities nontender, no edema, normal ROM Neurologic: Alert and oriented x 4.  GU: Neg CVAT.  PELVIC EXAM: Deferred  FHT 160 by doppler  LAB RESULTS No results found for this or any previous visit (from the past 24 hour(s)).  --/--/O POS (04/19 1204)  IMAGING No results found.  MAU Management/MDM: Orders Placed This Encounter  Procedures  . ED EKG  . Discharge patient    Meds ordered this encounter  Medications  . AND Linked Order Group   . alum & mag hydroxide-simeth (MAALOX/MYLANTA) 200-200-20 MG/5ML suspension 30 mL   . lidocaine (XYLOCAINE) 2 % viscous mouth solution 15 mL  . famotidine (PEPCID) 20 MG tablet    Sig: Take 1 tablet (20 mg total) by mouth 2 (two) times daily as needed for heartburn or indigestion.    Dispense:  30 tablet    Refill:  0    Order Specific Question:   Supervising Provider    Answer:   Reva BoresPRATT, TANYA S [[id number]]    EKG with NSR.  Pt pain c/w acid reflux.  GI Cocktail given.  Pt with significant improvement in pain after medication. D/C home with Rx for Pepcid, printed and verbal teaching about dietary  changes.  Keep scheduled appt in office, return to MAU as needed for emergencies.  Pt discharged with strict return precautions.  ASSESSMENT 1. Heartburn during pregnancy in second trimester     PLAN Discharge home Allergies as of 04/11/2019      Reactions   Penicillins Swelling, Other (See Comments)   Reaction:  Facial swelling  Has patient had a PCN reaction causing immediate rash, facial/tongue/throat swelling, SOB or lightheadedness with hypotension: Yes Has patient had a PCN reaction causing severe rash involving mucus membranes or skin necrosis: No Has patient had a PCN reaction that required hospitalization No Has patient had a PCN  reaction occurring within the last 10 years: Yes If all of the above answers are "NO", then may proceed with Cephalosporin use.      Medication List    TAKE these medications   acetaminophen 500 MG tablet Commonly known as: TYLENOL Take 1 tablet (500 mg total) by mouth every 6 (six) hours as needed.   clindamycin 150 MG capsule Commonly known as: CLEOCIN Take 1 capsule (150 mg total) by mouth every 6 (six) hours.   famotidine 20 MG tablet Commonly known as: PEPCID Take 1 tablet (20 mg total) by mouth 2 (two) times daily as needed for heartburn or indigestion.   ferrous sulfate 325 (65 FE) MG tablet Take 325 mg by mouth daily with breakfast.   Prenatal Vitamins 28-0.8 MG Tabs Take 1 tablet by mouth daily.      Follow-up [facility] Follow up.   Specialty: Obstetrics and Gynecology Why: As scheduled, return to MAU as needed for emergencies. Contact information: [address]. [address] [place] [address] [phone]          Hiyab Nhem Leftwich-Kirby Certified Nurse-Midwife 04/11/2019  10:37 PM

## 2019-05-26 ENCOUNTER — Encounter (HOSPITAL_COMMUNITY): Payer: Self-pay | Admitting: Emergency Medicine

## 2019-05-26 ENCOUNTER — Emergency Department (HOSPITAL_COMMUNITY)
Admission: EM | Admit: 2019-05-26 | Discharge: 2019-05-26 | Disposition: A | Discharge status: Home / Self Care | Payer: Medicaid Other | Attending: Emergency Medicine | Admitting: Emergency Medicine

## 2019-05-26 ENCOUNTER — Other Ambulatory Visit: Payer: Self-pay

## 2019-05-26 DIAGNOSIS — Z5321 Procedure and treatment not carried out due to patient leaving prior to being seen by health care provider: Secondary | ICD-10-CM | POA: Insufficient documentation

## 2019-05-26 DIAGNOSIS — R6884 Jaw pain: Secondary | ICD-10-CM | POA: Diagnosis present

## 2019-05-26 NOTE — ED Triage Notes (Signed)
Pt comes into the ED for jaw pain. Pt reports same has been going on for 2 days.

## 2019-05-26 NOTE — ED Notes (Signed)
Called pt and now answer x3. Pt not seen in the lobby.

## 2019-05-26 NOTE — ED Notes (Signed)
Pt called to go back to a room with no response x2.

## 2019-07-27 ENCOUNTER — Inpatient Hospital Stay (HOSPITAL_COMMUNITY)
Admission: AD | Admit: 2019-07-27 | Discharge: 2019-07-28 | Disposition: A | Discharge status: Home / Self Care | Payer: Medicaid Other | Attending: Obstetrics & Gynecology | Admitting: Obstetrics & Gynecology

## 2019-07-27 ENCOUNTER — Encounter (HOSPITAL_COMMUNITY): Payer: Self-pay

## 2019-07-27 ENCOUNTER — Other Ambulatory Visit: Payer: Self-pay

## 2019-07-27 ENCOUNTER — Inpatient Hospital Stay (HOSPITAL_BASED_OUTPATIENT_CLINIC_OR_DEPARTMENT_OTHER): Payer: Medicaid Other

## 2019-07-27 DIAGNOSIS — O26893 Other specified pregnancy related conditions, third trimester: Secondary | ICD-10-CM | POA: Diagnosis not present

## 2019-07-27 DIAGNOSIS — O368130 Decreased fetal movements, third trimester, not applicable or unspecified: Secondary | ICD-10-CM

## 2019-07-27 DIAGNOSIS — Z880 Allergy status to penicillin: Secondary | ICD-10-CM | POA: Insufficient documentation

## 2019-07-27 DIAGNOSIS — M545 Low back pain, unspecified: Secondary | ICD-10-CM

## 2019-07-27 DIAGNOSIS — O368190 Decreased fetal movements, unspecified trimester, not applicable or unspecified: Secondary | ICD-10-CM

## 2019-07-27 DIAGNOSIS — O368131 Decreased fetal movements, third trimester, fetus 1: Secondary | ICD-10-CM | POA: Diagnosis not present

## 2019-07-27 DIAGNOSIS — O99213 Obesity complicating pregnancy, third trimester: Secondary | ICD-10-CM | POA: Diagnosis not present

## 2019-07-27 DIAGNOSIS — Z3A33 33 weeks gestation of pregnancy: Secondary | ICD-10-CM | POA: Insufficient documentation

## 2019-07-27 DIAGNOSIS — Z3689 Encounter for other specified antenatal screening: Secondary | ICD-10-CM

## 2019-07-27 MED ORDER — ACETAMINOPHEN 500 MG PO TABS
1000.0000 mg | ORAL_TABLET | Freq: Once | ORAL | Status: AC
  Administered 2019-07-27: 1000 mg via ORAL
  Filled 2019-07-27: qty 2

## 2019-07-27 MED ORDER — COMFORT FIT MATERNITY SUPP SM MISC: 1.0000 [IU] | Freq: Every day | 0 refills | Status: DC | PRN

## 2019-07-27 NOTE — MAU Provider Note (Signed)
History     CSN: [id number]  Arrival date and time: 07/27/19 2152   First Provider Initiated Contact with Patient 07/27/19 2249      Chief Complaint  Patient presents with  . Back Pain  . Decreased Fetal Movement   [name] is a 28 y.o. G3P1011 at 6469w1d who presents to MAU for feeling decreased fetal movement beginning yesterday around 400PM. Pt reports she has felt a little movement today, but less movement than is normal.  Pt also reports sharp, LBP that is intermittent. Pt reports LBP has been present for past two days. Pt reports LBP is worse with movement and better with rest. Pt does not have a pregnancy support belt at home. Pt denies taking any medication for this pain. Pt rates pain as 7/10.  Last food/drink: drink - 1hr ago, food - 3hrs ago Smoker? no Current medications/supplements: PNVs, Fe Recent AFI: none on file Anterior placenta? none US on file Doing FKCs? no Problems this pregnancy include: none. Pt denies prior instances of DFM. Pt denies all risk factors for stillbirth, including, but not limited to: IUGR, placental abruption, infection, genetic/congenital anomalies, fetomaternal hemorrhage, DM, HTN, smoking/drug use, umbilical cord/placental abnormalities, uterine abnormalities, fetal hydrops, arrythmia, platelet dysfunction, IHCP.  Pt denies VB, LOF, ctx, vaginal discharge/odor/itching.  Allergies? PCNs Prenatal care provider/next appt? [facility], next appt 08/06/2019   OB History    Gravida  3   Para  1   Term  1   Preterm  0   AB  1   Living  1     SAB  1   TAB  0   Ectopic  0   Multiple  0   Live Births  1           Past Medical History:  Diagnosis Date  . Pregnancy induced hypertension    gHTN; no meds  . Vaginal Pap smear, abnormal     Past Surgical History:  Procedure Laterality Date  . BLADDER SURGERY     28 years old  . DILATION AND EVACUATION N/A 07/21/2018   Procedure: SUCTION DILATATION AND  EVACUATION;  Surgeon: Essie HartPinn, Walda, MD;  [facility] [place];  Service: Gynecology;  Laterality: N/A;    History reviewed. No pertinent family history.  Social History   Tobacco Use  . Smoking status: Never Smoker  . Smokeless tobacco: Never Used  Substance Use Topics  . Alcohol use: No  . Drug use: No    Allergies:  Allergies  Allergen Reactions  . Penicillins Swelling and Other (See Comments)    Reaction:  Facial swelling  Has patient had a PCN reaction causing immediate rash, facial/tongue/throat swelling, SOB or lightheadedness with hypotension: Yes Has patient had a PCN reaction causing severe rash involving mucus membranes or skin necrosis: No Has patient had a PCN reaction that required hospitalization No Has patient had a PCN reaction occurring within the last 10 years: Yes If all of the above answers are "NO", then may proceed with Cephalosporin use.    Medications Prior to Admission  Medication Sig Dispense Refill Last Dose  . ferrous sulfate 325 (65 FE) MG tablet Take 325 mg by mouth daily with breakfast.   07/26/2019 at Unknown time  . Prenatal Vit-Fe Fumarate-FA (PRENATAL VITAMINS) 28-0.8 MG TABS Take 1 tablet by mouth daily. 60 tablet 3 07/27/2019 at Unknown time  . acetaminophen (TYLENOL) 500 MG tablet Take 1 tablet (500 mg total) by mouth every 6 (six) hours as  needed. 30 tablet 0   . clindamycin (CLEOCIN) 150 MG capsule Take 1 capsule (150 mg total) by mouth every 6 (six) hours. 28 capsule 0   . famotidine (PEPCID) 20 MG tablet Take 1 tablet (20 mg total) by mouth 2 (two) times daily as needed for heartburn or indigestion. 30 tablet 0     Review of Systems  Constitutional: Negative for chills, diaphoresis, fatigue and fever.  Eyes: Negative for visual disturbance.  Respiratory: Negative for shortness of breath.   Cardiovascular: Negative for chest pain.  Gastrointestinal: Negative for abdominal pain, constipation, diarrhea, nausea and  vomiting.  Genitourinary: Negative for dysuria, flank pain, frequency, pelvic pain, urgency, vaginal bleeding and vaginal discharge.  Musculoskeletal: Positive for back pain.  Neurological: Negative for dizziness, weakness, light-headedness and headaches.   Physical Exam   Blood pressure 121/66, pulse 98, temperature 98.1 F (36.7 C), resp. rate 19, weight 117.5 kg, last menstrual period 12/07/2018, unknown if currently breastfeeding.  Patient Vitals for the past 24 hrs:  BP Temp Pulse Resp Weight  07/27/19 2206 121/66 98.1 F (36.7 C) 98 19 117.5 kg   Physical Exam  Constitutional: She is oriented to person, place, and time. She appears well-developed and well-nourished. No distress.  HENT:  Head: Normocephalic and atraumatic.  Respiratory: Effort normal.  GI: Soft. She exhibits no distension and no mass. There is no abdominal tenderness. There is no rebound and no guarding.  Musculoskeletal:     Lumbar back: Normal. She exhibits no tenderness, no swelling, no edema, no deformity, no laceration and no pain.  Neurological: She is alert and oriented to person, place, and time.  Skin: Skin is warm and dry. She is not diaphoretic.  Psychiatric: She has a normal mood and affect. Her behavior is normal. Judgment and thought content normal.   Results for orders placed or performed during the hospital encounter of 07/27/19 (from the past 24 hour(s))  Urinalysis, Routine w reflex microscopic     Status: Abnormal   Collection Time: 07/27/19 11:10 PM  Result Value Ref Range   Color, Urine STRAW (A) YELLOW   APPearance HAZY (A) CLEAR   Specific Gravity, Urine 1.004 (L) 1.005 - 1.030   pH 6.0 5.0 - 8.0   Glucose, UA NEGATIVE NEGATIVE mg/dL   Hgb urine dipstick SMALL (A) NEGATIVE   Bilirubin Urine NEGATIVE NEGATIVE   Ketones, ur NEGATIVE NEGATIVE mg/dL   Protein, ur NEGATIVE NEGATIVE mg/dL   Nitrite NEGATIVE NEGATIVE   Leukocytes,Ua LARGE (A) NEGATIVE   RBC / HPF 6-10 0 - 5 RBC/hpf    WBC, UA 11-20 0 - 5 WBC/hpf   Bacteria, UA RARE (A) NONE SEEN   Squamous Epithelial / LPF 6-10 0 - 5    MAU Course  Procedures  MDM -DFM -EFM: reactive       -baseline: 140/130       -variability: moderate       -accels: present, 15x15       -decels: absent       -TOCO: no ctx -BPP: 8/8, AFI 14.25 -pt given iced juice and informed of FKC process, pt reports she counted 10 FMs in less than 2hrs and reports that FM has returned to normal.  -LBP -normal PE -RX pregnancy belt -Tylenol 1000mg  given, pt now reports pain is 5/10, pt request RX for Flexeril for home use  -pt discharged to home in stable condition  Orders Placed This Encounter  Procedures  . MFM FETAL BPP WO NON STRESS  Standing Status:   Standing    Number of Occurrences:   1    Order Specific Question:   Symptom/Reason for Exam    Answer:   Decreased fetal movement [id number]  . Urinalysis, Routine w reflex microscopic    Standing Status:   Standing    Number of Occurrences:   1  . Discharge patient    Order Specific Question:   Discharge disposition    Answer:   01-Home or Self Care [1]    Order Specific Question:   Discharge patient date    Answer:   07/28/2019   Meds ordered this encounter  Medications  . acetaminophen (TYLENOL) tablet 1,000 mg  . Elastic Bandages & Supports (COMFORT FIT MATERNITY SUPP SM) MISC    Sig: 1 Units by Does not apply route daily as needed.    Dispense:  1 each    Refill:  0    Order Specific Question:   Supervising Provider    Answer:   Elonda Husky, LUTHER H [[id number]]  . cyclobenzaprine (FLEXERIL) 10 MG tablet    Sig: Take 1 tablet (10 mg total) by mouth 3 (three) times daily as needed.    Dispense:  30 tablet    Refill:  0    Order Specific Question:   Supervising Provider    Answer:   Tania Ade H [[id number]]   Assessment and Plan   1. Decreased fetal movements in third trimester, single or unspecified fetus   2. Decreased fetal movement   3. [redacted] weeks gestation of  pregnancy   4. NST (non-stress test) reactive   5. Low back pain during pregnancy in third trimester    Allergies as of 07/28/2019      Reactions   Penicillins Swelling, Other (See Comments)   Reaction:  Facial swelling  Has patient had a PCN reaction causing immediate rash, facial/tongue/throat swelling, SOB or lightheadedness with hypotension: Yes Has patient had a PCN reaction causing severe rash involving mucus membranes or skin necrosis: No Has patient had a PCN reaction that required hospitalization No Has patient had a PCN reaction occurring within the last 10 years: Yes If all of the above answers are "NO", then may proceed with Cephalosporin use.      Medication List    TAKE these medications   acetaminophen 500 MG tablet Commonly known as: TYLENOL Take 1 tablet (500 mg total) by mouth every 6 (six) hours as needed.   clindamycin 150 MG capsule Commonly known as: CLEOCIN Take 1 capsule (150 mg total) by mouth every 6 (six) hours.   Comfort Fit Maternity Supp Sm Misc 1 Units by Does not apply route daily as needed.   cyclobenzaprine 10 MG tablet Commonly known as: FLEXERIL Take 1 tablet (10 mg total) by mouth 3 (three) times daily as needed.   famotidine 20 MG tablet Commonly known as: PEPCID Take 1 tablet (20 mg total) by mouth 2 (two) times daily as needed for heartburn or indigestion.   ferrous sulfate 325 (65 FE) MG tablet Take 325 mg by mouth daily with breakfast.   Prenatal Vitamins 28-0.8 MG Tabs Take 1 tablet by mouth daily.      -RX pregnancy support belt with instructions -RX Flexeril -discussed FKC and ways to elicit FM at home -discussed that mothers do not perceive 100% of fetal movement, and there is wide variation of mother perception of fetal movement -discussed that fetal movement varies somewhat depending on the time of day and gestational age and  there is a wide variety of normal movement among healthy babies -discussed that patient should  not expect regular movement until 28wks -discussed that frequency of movement typically increases from morning to night, with peak activity late at night -discussed that fetal sleep cycles become longer with advancing gestation and can last about 20-50minutes -Pt advised to contact HCP immediately if noticing a decrease in fetal movement compared to what is normal -discussed FKCs vs. monitoring movements; can either do fetal kick counting, or just be aware of what is normal for baby in terms of movement; no one method is better than the other -FKC: at least 10 fetal movements (FMs) over up to two hours when at rest and focused on counting -pt discharged to home in stable condition  Joni Reining E Chaelyn Bunyan 07/28/2019, 12:38 AM

## 2019-07-27 NOTE — MAU Note (Signed)
Reports no fetal movement today and sharp lower back pain that started yesterday and is constant.  No current abdominal pain.  No VB.  No LOF.  No complications w/ pregnancy.

## 2019-07-28 LAB — URINALYSIS, ROUTINE W REFLEX MICROSCOPIC
Bilirubin Urine: NEGATIVE
Glucose, UA: NEGATIVE mg/dL
Ketones, ur: NEGATIVE mg/dL
Nitrite: NEGATIVE
Protein, ur: NEGATIVE mg/dL
Specific Gravity, Urine: 1.004 — ABNORMAL LOW (ref 1.005–1.030)
pH: 6 (ref 5.0–8.0)

## 2019-07-28 MED ORDER — CYCLOBENZAPRINE HCL 10 MG PO TABS: 10.0000 mg | ORAL_TABLET | Freq: Three times a day (TID) | ORAL | 0 refills | Status: DC | PRN

## 2019-07-28 NOTE — Discharge Instructions (Signed)
Preterm Labor and Birth Information  The normal length of a pregnancy is 39-41 weeks. Preterm labor is when labor starts before 37 completed weeks of pregnancy. What are the risk factors for preterm labor? Preterm labor is more likely to occur in women who: Have certain infections during pregnancy such as a bladder infection, sexually transmitted infection, or infection inside the uterus (chorioamnionitis). Have a shorter-than-normal cervix. Have gone into preterm labor before. Have had surgery on their cervix. Are younger than age 36 or older than age 51. Are African American. Are pregnant with twins or multiple babies (multiple gestation). Take street drugs or smoke while pregnant. Do not gain enough weight while pregnant. Became pregnant shortly after having been pregnant. What are the symptoms of preterm labor? Symptoms of preterm labor include: Cramps similar to those that can happen during a menstrual period. The cramps may happen with diarrhea. Pain in the abdomen or lower back. Regular uterine contractions that may feel like tightening of the abdomen. A feeling of increased pressure in the pelvis. Increased watery or bloody mucus discharge from the vagina. Water breaking (ruptured amniotic sac). Why is it important to recognize signs of preterm labor? It is important to recognize signs of preterm labor because babies who are born prematurely may not be fully developed. This can put them at an increased risk for: Long-term (chronic) heart and lung problems. Difficulty immediately after birth with regulating body systems, including blood sugar, body temperature, heart rate, and breathing rate. Bleeding in the brain. Cerebral palsy. Learning difficulties. Death. These risks are highest for babies who are born before 34 weeks of pregnancy. How is preterm labor treated? Treatment depends on the length of your pregnancy, your condition, and the health of your baby. It may  involve: Having a stitch (suture) placed in your cervix to prevent your cervix from opening too early (cerclage). Taking or being given medicines, such as: Hormone medicines. These may be given early in pregnancy to help support the pregnancy. Medicine to stop contractions. Medicines to help mature the babys lungs. These may be prescribed if the risk of delivery is high. Medicines to prevent your baby from developing cerebral palsy. If the labor happens before 34 weeks of pregnancy, you may need to stay in the hospital. What should I do if I think I am in preterm labor? If you think that you are going into preterm labor, call your health care provider right away. How can I prevent preterm labor in future pregnancies? To increase your chance of having a full-term pregnancy: Do not use any tobacco products, such as cigarettes, chewing tobacco, and e-cigarettes. If you need help quitting, ask your health care provider. Do not use street drugs or medicines that have not been prescribed to you during your pregnancy. Talk with your health care provider before taking any herbal supplements, even if you have been taking them regularly. Make sure you gain a healthy amount of weight during your pregnancy. Watch for infection. If you think that you might have an infection, get it checked right away. Make sure to tell your health care provider if you have gone into preterm labor before. This information is not intended to replace advice given to you by your health care provider. Make sure you discuss any questions you have with your health care provider. Document Released: 12/15/2003 Document Revised: 01/16/2019 Document Reviewed: 02/15/2016 Elsevier Patient Education  2020 Else[employer] Inc[employer]  PREGNANCY SUPPORT BELT: You are not alone, Seventy-five percent of women have some  sort of abdominal or back pain at some point in their pregnancy. Your baby is growing at a fast pace, which means that your whole body  is rapidly trying to adjust to the changes. As your uterus grows, your back may start feeling a bit under stress and this can result in back or abdominal pain that can go from mild, and therefore bearable, to severe pains that will not allow you to sit or lay down comfortably, When it comes to dealing with pregnancy-related pains and cramps, some pregnant women usually prefer natural remedies, which the market is filled with nowadays. For example, wearing a pregnancy support belt can help ease and lessen your discomfort and pain. WHAT ARE THE BENEFITS OF WEARING A PREGNANCY SUPPORT BELT? A pregnancy support belt provides support to the lower portion of the belly taking some of the weight of the growing uterus and distributing to the other parts of your body. It is designed make you comfortable and gives you extra support. Over the years, the pregnancy apparel market has been studying the needs and wants of pregnant women and they have come up with the most comfortable pregnancy support belts that woman could ever ask for. In fact, you will no longer have to wear a stretched-out or bulky pregnancy belt that is visible underneath your clothes and makes you feel even more uncomfortable. Nowadays, a pregnancy support belt is made of comfortable and stretchy materials that will not irritate your skin but will actually make you feel at ease and you will not even notice you are wearing it. They are easy to put on and adjust during the day and can be worn at night for additional support.  BENEFITS:  Relives Back pain  Relieves Abdominal Muscle and Leg Pain  Stabilizes the Pelvic Ring  Offers a Cushioned Abdominal Lift Pad  Relieves pressure on the Sciatic Nerve Within Minutes WHERE TO GET YOUR PREGNANCY BELT: [employer] [phone] [phone], [place] [address]    Fetal Movement Counts Patient Name: ________________________________________________ Patient Due Date:  ____________________ What is a fetal movement count?  A fetal movement count is the number of times that you feel your baby move during a certain amount of time. This may also be called a fetal kick count. A fetal movement count is recommended for every pregnant woman. You may be asked to start counting fetal movements as early as week 28 of your pregnancy. Pay attention to when your baby is most active. You may notice your baby's sleep and wake cycles. You may also notice things that make your baby move more. You should do a fetal movement count:  When your baby is normally most active.  At the same time each day. A good time to count movements is while you are resting, after having something to eat and drink. How do I count fetal movements? 1. Find a quiet, comfortable area. Sit, or lie down on your side. 2. Write down the date, the start time and stop time, and the number of movements that you felt between those two times. Take this information with you to your health care visits. 3. For 2 hours, count kicks, flutters, swishes, rolls, and jabs. You should feel at least 10 movements during 2 hours. 4. You may stop counting after you have felt 10 movements. 5. If you do not feel 10 movements in 2 hours, have something to eat and drink. Then, keep resting and counting for 1 hour. If  you feel at least 4 movements during that hour, you may stop counting. Contact a health care provider if:  You feel fewer than 4 movements in 2 hours.  Your baby is not moving like he or she usually does. Date: ____________ Start time: ____________ Stop time: ____________ Movements: ____________ Date: ____________ Start time: ____________ Stop time: ____________ Movements: ____________ Date: ____________ Start time: ____________ Stop time: ____________ Movements: ____________ Date: ____________ Start time: ____________ Stop time: ____________ Movements: ____________ Date: ____________ Start time: ____________  Stop time: ____________ Movements: ____________ Date: ____________ Start time: ____________ Stop time: ____________ Movements: ____________ Date: ____________ Start time: ____________ Stop time: ____________ Movements: ____________ Date: ____________ Start time: ____________ Stop time: ____________ Movements: ____________ Date: ____________ Start time: ____________ Stop time: ____________ Movements: ____________ This information is not intended to replace advice given to you by your health care provider. Make sure you discuss any questions you have with your health care provider. Document Released: 10/24/2006 Document Revised: 10/14/2018 Document Reviewed: 11/03/2015 Elsevier Patient Education  2020 Else[employer] Inc.  Back Pain in Pregnancy Back pain during pregnancy is common. Back pain may be caused by several factors that are related to changes during your pregnancy. Follow these instructions at home: Managing pain, stiffness, and swelling      If directed, for sudden (acute) back pain, put ice on the painful area. ? Put ice in a plastic bag. ? Place a towel between your skin and the bag. ? Leave the ice on for 20 minutes, 2-3 times per day.  If directed, apply heat to the affected area before you exercise. Use the heat source that your health care provider recommends, such as a moist heat pack or a heating pad. ? Place a towel between your skin and the heat source. ? Leave the heat on for 20-30 minutes. ? Remove the heat if your skin turns bright red. This is especially important if you are unable to feel pain, heat, or cold. You may have a greater risk of getting burned.  If directed, massage the affected area. Activity  Exercise as told by your health care provider. Gentle exercise is the best way to prevent or manage back pain.  Listen to your body when lifting. If lifting hurts, ask for help or bend your knees. This uses your leg muscles instead of your back muscles.  Squat  down when picking up something from the floor. Do not bend over.  Only use bed rest for short periods as told by your health care provider. Bed rest should only be used for the most severe episodes of back pain. Standing, sitting, and lying down  Do not stand in one place for long periods of time.  Use good posture when sitting. Make sure your head rests over your shoulders and is not hanging forward. Use a pillow on your lower back if necessary.  Try sleeping on your side, preferably the left side, with a pregnancy support pillow or 1-2 regular pillows between your legs. ? If you have back pain after a night's rest, your bed may be too soft. ? A firm mattress may provide more support for your back during pregnancy. General instructions  Do not wear high heels.  Eat a healthy diet. Try to gain weight within your health care provider's recommendations.  Use a maternity girdle, elastic sling, or back brace as told by your health care provider.  Take over-the-counter and prescription medicines only as told by your health care provider.  Work with a physical  therapist or massage therapist to find ways to manage back pain. Acupuncture or massage therapy may be helpful.  Keep all follow-up visits as told by your health care provider. This is important. Contact a health care provider if:  Your back pain interferes with your daily activities.  You have increasing pain in other parts of your body. Get help right away if:  You develop numbness, tingling, weakness, or problems with the use of your arms or legs.  You develop severe back pain that is not controlled with medicine.  You have a change in bowel or bladder control.  You develop shortness of breath, dizziness, or you faint.  You develop nausea, vomiting, or sweating.  You have back pain that is a rhythmic, cramping pain similar to labor pains. Labor pain is usually 1-2 minutes apart, lasts for about 1 minute, and involves a  bearing down feeling or pressure in your pelvis.  You have back pain and your water breaks or you have vaginal bleeding.  You have back pain or numbness that travels down your leg.  Your back pain developed after you fell.  You develop pain on one side of your back.  You see blood in your urine.  You develop skin blisters in the area of your back pain. Summary  Back pain may be caused by several factors that are related to changes during your pregnancy.  Follow instructions as told by your health care provider for managing pain, stiffness, and swelling.  Exercise as told by your health care provider. Gentle exercise is the best way to prevent or manage back pain.  Take over-the-counter and prescription medicines only as told by your health care provider.  Keep all follow-up visits as told by your health care provider. This is important. This information is not intended to replace advice given to you by your health care provider. Make sure you discuss any questions you have with your health care provider. Document Released: 01/02/2006 Document Revised: 01/13/2019 Document Reviewed: 03/12/2018 Elsevier Patient Education  2020 [employer]

## 2019-08-20 ENCOUNTER — Inpatient Hospital Stay (HOSPITAL_COMMUNITY)
Admission: AD | Admit: 2019-08-20 | Discharge: 2019-08-20 | Disposition: A | Discharge status: Home / Self Care | Payer: Medicaid Other | Attending: Obstetrics & Gynecology | Admitting: Obstetrics & Gynecology

## 2019-08-20 ENCOUNTER — Encounter (HOSPITAL_COMMUNITY): Payer: Self-pay | Admitting: *Deleted

## 2019-08-20 ENCOUNTER — Other Ambulatory Visit: Payer: Self-pay

## 2019-08-20 DIAGNOSIS — O479 False labor, unspecified: Secondary | ICD-10-CM

## 2019-08-20 DIAGNOSIS — Z3A36 36 weeks gestation of pregnancy: Secondary | ICD-10-CM | POA: Insufficient documentation

## 2019-08-20 DIAGNOSIS — O4703 False labor before 37 completed weeks of gestation, third trimester: Secondary | ICD-10-CM

## 2019-08-20 LAB — URINALYSIS, ROUTINE W REFLEX MICROSCOPIC
Bilirubin Urine: NEGATIVE
Glucose, UA: NEGATIVE mg/dL
Ketones, ur: NEGATIVE mg/dL
Nitrite: NEGATIVE
Protein, ur: NEGATIVE mg/dL
Specific Gravity, Urine: 1.011 (ref 1.005–1.030)
pH: 6 (ref 5.0–8.0)

## 2019-08-20 NOTE — Progress Notes (Signed)
I have communicated with Rosine Abe, CNM and reviewed vital signs:  Vitals:   08/20/19 0921 08/20/19 1045  BP: 134/70 110/65  Pulse: 95 (!) 101  Resp: 18 20  Temp: 98.4 F (36.9 C) 98.5 F (36.9 C)  SpO2: 100% 100%    Vaginal exam:  Dilation: Closed Effacement (%): Thick Cervical Position: Posterior Station: Ballotable Exam by:: Ginger Janera Peugh RN,   Also reviewed contraction pattern and that non-stress test is reactive.  It has been documented that patient is contracting rarely  with no cervical change over 54 minutes not indicating active labor.  Patient denies any other complaints.  Based on this report provider has given order for discharge.  A discharge order and diagnosis entered by a provider.   Labor discharge instructions reviewed with patient.

## 2019-08-20 NOTE — MAU Note (Signed)
.   Heather Riggs is a 28 y.o. at [redacted]w[redacted]d here in MAU reporting: contractions that started last night. Pt denies any VB or LOF LMP:  Onset of complaint: last night Pain score: 6 Vitals:   08/20/19 0921  BP: 134/70  Pulse: 95  Resp: 18  Temp: 98.4 F (36.9 C)  SpO2: 100%     FHT:145 Lab orders placed from triage: UA

## 2019-08-20 NOTE — Discharge Instructions (Signed)

## 2019-08-20 NOTE — MAU Provider Note (Signed)
None     S: [name] is a 28 y.o. G3P1011 at [redacted]w[redacted]d  who presents to MAU today complaining of regular, recurrent and painful contractions since last night. She denies vaginal bleeding. She denies LOF. She reports normal fetal movement.    O: BP 110/65 (BP Location: Right Arm)   Pulse (!) 101   Temp 98.5 F (36.9 C) (Oral)   Resp 20   Wt 117.5 kg   LMP 12/07/2018   SpO2 100%   BMI 35.13 kg/m  GENERAL: Well-developed, well-nourished female in no acute distress.  HEAD: Normocephalic, atraumatic.  CHEST: Normal effort of breathing, regular heart rate ABDOMEN: Soft, nontender, gravid  Cervical exam:  Dilation: Closed Effacement (%): Thick Cervical Position: Posterior Station: Ballotable Exam by:: Ginger Morris RN   Fetal Monitoring: Baseline: 140 Variability: Mod Accelerations:  15 x 15 Decelerations: N/A Contractions: Irregular and spacing out during period of evaluation in MAU   A: [facility] at [redacted]w[redacted]d  False labor , cervix closed internally, 1 externally Normotensive Category I tracing Patient denies pain 30 minutes after start of tracing  P: --Discharge home in stable condition with labor precautions  Mallie Snooks, CNM 08/20/2019 11:19 AM

## 2019-08-26 ENCOUNTER — Inpatient Hospital Stay (HOSPITAL_COMMUNITY): Payer: Medicaid Other

## 2019-08-26 ENCOUNTER — Encounter (HOSPITAL_COMMUNITY): Payer: Self-pay

## 2019-08-26 ENCOUNTER — Other Ambulatory Visit: Payer: Self-pay

## 2019-08-26 ENCOUNTER — Inpatient Hospital Stay (HOSPITAL_COMMUNITY)
Admission: AD | Admit: 2019-08-26 | Discharge: 2019-08-28 | DRG: 807 | Disposition: A | Discharge status: Home / Self Care | Payer: Medicaid Other | Attending: Obstetrics & Gynecology | Admitting: Obstetrics & Gynecology

## 2019-08-26 ENCOUNTER — Inpatient Hospital Stay (HOSPITAL_COMMUNITY): Payer: Medicaid Other | Admitting: Anesthesiology

## 2019-08-26 DIAGNOSIS — O368330 Maternal care for abnormalities of the fetal heart rate or rhythm, third trimester, not applicable or unspecified: Secondary | ICD-10-CM

## 2019-08-26 DIAGNOSIS — O9902 Anemia complicating childbirth: Secondary | ICD-10-CM | POA: Diagnosis present

## 2019-08-26 DIAGNOSIS — IMO0002 Reserved for concepts with insufficient information to code with codable children: Secondary | ICD-10-CM

## 2019-08-26 DIAGNOSIS — D573 Sickle-cell trait: Secondary | ICD-10-CM | POA: Diagnosis present

## 2019-08-26 DIAGNOSIS — Z3A37 37 weeks gestation of pregnancy: Secondary | ICD-10-CM

## 2019-08-26 DIAGNOSIS — O99824 Streptococcus B carrier state complicating childbirth: Secondary | ICD-10-CM | POA: Diagnosis present

## 2019-08-26 DIAGNOSIS — Z20828 Contact with and (suspected) exposure to other viral communicable diseases: Secondary | ICD-10-CM | POA: Diagnosis present

## 2019-08-26 DIAGNOSIS — O368390 Maternal care for abnormalities of the fetal heart rate or rhythm, unspecified trimester, not applicable or unspecified: Secondary | ICD-10-CM

## 2019-08-26 DIAGNOSIS — O134 Gestational [pregnancy-induced] hypertension without significant proteinuria, complicating childbirth: Secondary | ICD-10-CM | POA: Diagnosis present

## 2019-08-26 DIAGNOSIS — Z3490 Encounter for supervision of normal pregnancy, unspecified, unspecified trimester: Secondary | ICD-10-CM | POA: Diagnosis present

## 2019-08-26 DIAGNOSIS — O99213 Obesity complicating pregnancy, third trimester: Secondary | ICD-10-CM

## 2019-08-26 DIAGNOSIS — O139 Gestational [pregnancy-induced] hypertension without significant proteinuria, unspecified trimester: Secondary | ICD-10-CM | POA: Diagnosis not present

## 2019-08-26 LAB — TYPE AND SCREEN
ABO/RH(D): O POS
Antibody Screen: NEGATIVE

## 2019-08-26 LAB — CBC
HCT: 34.3 % — ABNORMAL LOW (ref 36.0–46.0)
Hemoglobin: 11.2 g/dL — ABNORMAL LOW (ref 12.0–15.0)
MCH: 25.5 pg — ABNORMAL LOW (ref 26.0–34.0)
MCHC: 32.7 g/dL (ref 30.0–36.0)
MCV: 78 fL — ABNORMAL LOW (ref 80.0–100.0)
Platelets: 305 10*3/uL (ref 150–400)
RBC: 4.4 MIL/uL (ref 3.87–5.11)
RDW: 14.5 % (ref 11.5–15.5)
WBC: 12.3 10*3/uL — ABNORMAL HIGH (ref 4.0–10.5)
nRBC: 0 % (ref 0.0–0.2)

## 2019-08-26 LAB — RPR: RPR Ser Ql: NONREACTIVE

## 2019-08-26 LAB — SARS CORONAVIRUS 2 (TAT 6-24 HRS): SARS Coronavirus 2: NEGATIVE

## 2019-08-26 MED ORDER — DIBUCAINE (PERIANAL) 1 % EX OINT: 1.0000 "application " | TOPICAL_OINTMENT | CUTANEOUS | Status: DC | PRN

## 2019-08-26 MED ORDER — IBUPROFEN 600 MG PO TABS
600.0000 mg | ORAL_TABLET | Freq: Four times a day (QID) | ORAL | Status: DC
  Administered 2019-08-26 – 2019-08-28 (×7): 600 mg via ORAL
  Filled 2019-08-26 (×8): qty 1

## 2019-08-26 MED ORDER — SODIUM CHLORIDE (PF) 0.9 % IJ SOLN
INTRAMUSCULAR | Status: DC | PRN
  Administered 2019-08-26: 12 mL/h via EPIDURAL

## 2019-08-26 MED ORDER — SODIUM CHLORIDE 0.9 % IV SOLN
5.0000 10*6.[IU] | Freq: Once | INTRAVENOUS | Status: AC
  Administered 2019-08-26: 5 10*6.[IU] via INTRAVENOUS
  Filled 2019-08-26: qty 5

## 2019-08-26 MED ORDER — TETANUS-DIPHTH-ACELL PERTUSSIS 5-2.5-18.5 LF-MCG/0.5 IM SUSP: 0.5000 mL | Freq: Once | INTRAMUSCULAR | Status: DC

## 2019-08-26 MED ORDER — LACTATED RINGERS IV SOLN: 500.0000 mL | INTRAVENOUS | Status: DC | PRN

## 2019-08-26 MED ORDER — ONDANSETRON HCL 4 MG/2ML IJ SOLN: 4.0000 mg | INTRAMUSCULAR | Status: DC | PRN

## 2019-08-26 MED ORDER — SENNOSIDES-DOCUSATE SODIUM 8.6-50 MG PO TABS
2.0000 | ORAL_TABLET | ORAL | Status: DC
  Administered 2019-08-26 – 2019-08-28 (×2): 2 via ORAL
  Filled 2019-08-26 (×2): qty 2

## 2019-08-26 MED ORDER — LIDOCAINE-EPINEPHRINE (PF) 2 %-1:200000 IJ SOLN
INTRAMUSCULAR | Status: DC | PRN
  Administered 2019-08-26: 3 mL via INTRADERMAL

## 2019-08-26 MED ORDER — ACETAMINOPHEN 325 MG PO TABS: 650.0000 mg | ORAL_TABLET | ORAL | Status: DC | PRN

## 2019-08-26 MED ORDER — PENICILLIN G POT IN DEXTROSE 60000 UNIT/ML IV SOLN
3.0000 10*6.[IU] | INTRAVENOUS | Status: DC
  Administered 2019-08-26 (×2): 3 10*6.[IU] via INTRAVENOUS
  Filled 2019-08-26 (×2): qty 50

## 2019-08-26 MED ORDER — LIDOCAINE HCL (PF) 1 % IJ SOLN: 30.0000 mL | INTRAMUSCULAR | Status: DC | PRN

## 2019-08-26 MED ORDER — OXYTOCIN 40 UNITS IN NORMAL SALINE INFUSION - SIMPLE MED
1.0000 m[IU]/min | INTRAVENOUS | Status: DC
  Administered 2019-08-26: 1 m[IU]/min via INTRAVENOUS
  Filled 2019-08-26: qty 1000

## 2019-08-26 MED ORDER — EPHEDRINE 5 MG/ML INJ: 10.0000 mg | INTRAVENOUS | Status: DC | PRN

## 2019-08-26 MED ORDER — SOD CITRATE-CITRIC ACID 500-334 MG/5ML PO SOLN: 30.0000 mL | ORAL | Status: DC | PRN

## 2019-08-26 MED ORDER — OXYCODONE-ACETAMINOPHEN 5-325 MG PO TABS: 2.0000 | ORAL_TABLET | ORAL | Status: DC | PRN

## 2019-08-26 MED ORDER — FENTANYL-BUPIVACAINE-NACL 0.5-0.125-0.9 MG/250ML-% EP SOLN
EPIDURAL | Status: AC
  Filled 2019-08-26: qty 250

## 2019-08-26 MED ORDER — ZOLPIDEM TARTRATE 5 MG PO TABS: 5.0000 mg | ORAL_TABLET | Freq: Every evening | ORAL | Status: DC | PRN

## 2019-08-26 MED ORDER — COCONUT OIL OIL: 1.0000 "application " | TOPICAL_OIL | Status: DC | PRN

## 2019-08-26 MED ORDER — DIPHENHYDRAMINE HCL 50 MG/ML IJ SOLN
12.5000 mg | INTRAMUSCULAR | Status: DC | PRN
  Administered 2019-08-26: 12.5 mg via INTRAVENOUS
  Filled 2019-08-26: qty 1

## 2019-08-26 MED ORDER — LACTATED RINGERS IV SOLN
INTRAVENOUS | Status: DC
  Administered 2019-08-26 (×2): via INTRAVENOUS

## 2019-08-26 MED ORDER — PHENYLEPHRINE 40 MCG/ML (10ML) SYRINGE FOR IV PUSH (FOR BLOOD PRESSURE SUPPORT): 80.0000 ug | PREFILLED_SYRINGE | INTRAVENOUS | Status: DC | PRN

## 2019-08-26 MED ORDER — FENTANYL-BUPIVACAINE-NACL 0.5-0.125-0.9 MG/250ML-% EP SOLN: 12.0000 mL/h | EPIDURAL | Status: DC | PRN

## 2019-08-26 MED ORDER — ONDANSETRON HCL 4 MG PO TABS: 4.0000 mg | ORAL_TABLET | ORAL | Status: DC | PRN

## 2019-08-26 MED ORDER — TERBUTALINE SULFATE 1 MG/ML IJ SOLN: 0.2500 mg | Freq: Once | INTRAMUSCULAR | Status: DC | PRN

## 2019-08-26 MED ORDER — SIMETHICONE 80 MG PO CHEW: 80.0000 mg | CHEWABLE_TABLET | ORAL | Status: DC | PRN

## 2019-08-26 MED ORDER — PRENATAL MULTIVITAMIN CH
1.0000 | ORAL_TABLET | Freq: Every day | ORAL | Status: DC
  Administered 2019-08-27: 1 via ORAL
  Filled 2019-08-26 (×2): qty 1

## 2019-08-26 MED ORDER — OXYCODONE-ACETAMINOPHEN 5-325 MG PO TABS: 1.0000 | ORAL_TABLET | ORAL | Status: DC | PRN

## 2019-08-26 MED ORDER — OXYTOCIN 40 UNITS IN NORMAL SALINE INFUSION - SIMPLE MED: 1.0000 m[IU]/min | INTRAVENOUS | Status: DC

## 2019-08-26 MED ORDER — WITCH HAZEL-GLYCERIN EX PADS: 1.0000 "application " | MEDICATED_PAD | CUTANEOUS | Status: DC | PRN

## 2019-08-26 MED ORDER — DIPHENHYDRAMINE HCL 25 MG PO CAPS: 25.0000 mg | ORAL_CAPSULE | Freq: Four times a day (QID) | ORAL | Status: DC | PRN

## 2019-08-26 MED ORDER — LACTATED RINGERS IV SOLN
500.0000 mL | Freq: Once | INTRAVENOUS | Status: AC
  Administered 2019-08-26: 500 mL via INTRAVENOUS

## 2019-08-26 MED ORDER — OXYTOCIN BOLUS FROM INFUSION
500.0000 mL | Freq: Once | INTRAVENOUS | Status: AC
  Administered 2019-08-26: 500 mL via INTRAVENOUS

## 2019-08-26 MED ORDER — LACTATED RINGERS IV SOLN: INTRAVENOUS | Status: DC

## 2019-08-26 MED ORDER — BENZOCAINE-MENTHOL 20-0.5 % EX AERO
1.0000 "application " | INHALATION_SPRAY | CUTANEOUS | Status: DC | PRN
  Administered 2019-08-28: 1 via TOPICAL
  Filled 2019-08-26: qty 56

## 2019-08-26 MED ORDER — OXYTOCIN 40 UNITS IN NORMAL SALINE INFUSION - SIMPLE MED: 2.5000 [IU]/h | INTRAVENOUS | Status: DC

## 2019-08-26 MED ORDER — ONDANSETRON HCL 4 MG/2ML IJ SOLN
4.0000 mg | Freq: Four times a day (QID) | INTRAMUSCULAR | Status: DC | PRN
  Administered 2019-08-26: 4 mg via INTRAVENOUS
  Filled 2019-08-26: qty 2

## 2019-08-26 NOTE — Lactation Note (Signed)
This note was copied from a baby's chart. Lactation Consultation Note  Patient Name: Heather Riggs FIEPP'I Date: 08/26/2019 Reason for consult: Initial assessment;Early term 37-38.6wks;Infant < 6lbs  2151 - I conducted an initial consult with Heather Riggs, a P2 who breast fed her first child for several weeks before switching to formula and EBM. She states that her 15 hour old daughter, Heather Riggs, has latched three times since delivery. She has been shown HE by her RN, and she states that she saw colostrum.  Heather Riggs has a DEBP set up in the room as well as a manual pump by her bedside. She states that she does know how to use it. She had the breast feeding and supplementation guidelines at the bedside as well.  Heather Riggs states that she would like to breast feed and bottle feed. She has already given one formula feeding.  She states that for her last baby, she had difficulty maintaining supply after returning to work. She wants to make sure baby Heather Riggs will take a bottle. She will return to work in 17 weeks.  I reviewed the LPI protocol, due to baby's weight under 6lbs. I clarified supplementation guidelines for breast feeding vs. supplementing fully.  I educated on the nature of supply and demand with breast milk production, and I encouraged Heather Riggs to always breast feed prior to offering supplementation. I also encouraged her to pump 8 times a day to increase milk production and set expectations for breast milk production for days 1-3.  PLAN: Follow LPI guidelines: Breast feed on demand 8-12 times a day. Wake to feed if baby has not initiated feeding after a 4 hour interval. Supplement as per guidelines after. Pump 15 minutes on the initiation setting after each feeding or 8 times a day. Feed EBM prior to offering ABM.   Maternal Data Has patient been taught Hand Expression?: Yes Does the patient have breastfeeding experience prior to this delivery?: Yes  Feeding Feeding Type:  Breast Fed  LATCH Score Latch: Repeated attempts needed to sustain latch, nipple held in mouth throughout feeding, stimulation needed to elicit sucking reflex.  Audible Swallowing: A few with stimulation  Type of Nipple: Everted at rest and after stimulation  Comfort (Breast/Nipple): Soft / non-tender  Hold (Positioning): Assistance needed to correctly position infant at breast and maintain latch.  LATCH Score: 7  Interventions Interventions: Breast feeding basics reviewed(LPI protocol)  Lactation Tools Discussed/Used WIC Program: Yes Pump Review: Setup, frequency, and cleaning Initiated by:: [name], RN  Date initiated:: 08/26/19   Consult Status Consult Status: Follow-up Date: 08/27/19 Follow-up type: In-patient    [name] 08/26/2019, 10:13 PM

## 2019-08-26 NOTE — H&P (Addendum)
Heather Riggs is a 28 y.o. female, G3P1011, IUP at 37.3 weeks, presenting for IOL for fetal late decel x1 and BPP of 6/10, no tone or movement noted. Pt was oringally seen in MAU for cxt and blood show, but since have subsided. Baby low risk female. H/O sickle cell trait and GHTN, no meds, BP normotensive this pregnancy, denies HA< RUQ pain or vision changes. Pt endorse + Fm. Denies vaginal leakage. Denies vaginal bleeding.  GBS +: Pt not allergic to penicillin, no rash, SOB, itchy, pt had swelling from an abscess tooth then took penicillin a long time ago and stated her cheek became more swollen. Pt had taken amoxicillin also with no reaction. Pt consent to receiving penicillin for GBS+. No susceptibility test was performed in office.    Patient Active Problem List   Diagnosis Date Noted  . Status post dilatation and curettage 07/21/2018  . Labor and delivery, indication for care 02/06/2017     Medications Prior to Admission  Medication Sig Dispense Refill Last Dose  . acetaminophen (TYLENOL) 500 MG tablet Take 1 tablet (500 mg total) by mouth every 6 (six) hours as needed. 30 tablet 0   . clindamycin (CLEOCIN) 150 MG capsule Take 1 capsule (150 mg total) by mouth every 6 (six) hours. 28 capsule 0   . cyclobenzaprine (FLEXERIL) 10 MG tablet Take 1 tablet (10 mg total) by mouth 3 (three) times daily as needed. 30 tablet 0   . Elastic Bandages & Supports (COMFORT FIT MATERNITY SUPP SM) MISC 1 Units by Does not apply route daily as needed. 1 each 0   . famotidine (PEPCID) 20 MG tablet Take 1 tablet (20 mg total) by mouth 2 (two) times daily as needed for heartburn or indigestion. 30 tablet 0   . ferrous sulfate 325 (65 FE) MG tablet Take 325 mg by mouth daily with breakfast.     . Prenatal Vit-Fe Fumarate-FA (PRENATAL VITAMINS) 28-0.8 MG TABS Take 1 tablet by mouth daily. 60 tablet 3     Past Medical History:  Diagnosis Date  . Pregnancy induced hypertension    gHTN; no meds  . Vaginal  Pap smear, abnormal      No current facility-administered medications on file prior to encounter.    Current Outpatient Medications on File Prior to Encounter  Medication Sig Dispense Refill  . acetaminophen (TYLENOL) 500 MG tablet Take 1 tablet (500 mg total) by mouth every 6 (six) hours as needed. 30 tablet 0  . clindamycin (CLEOCIN) 150 MG capsule Take 1 capsule (150 mg total) by mouth every 6 (six) hours. 28 capsule 0  . cyclobenzaprine (FLEXERIL) 10 MG tablet Take 1 tablet (10 mg total) by mouth 3 (three) times daily as needed. 30 tablet 0  . Elastic Bandages & Supports (COMFORT FIT MATERNITY SUPP SM) MISC 1 Units by Does not apply route daily as needed. 1 each 0  . famotidine (PEPCID) 20 MG tablet Take 1 tablet (20 mg total) by mouth 2 (two) times daily as needed for heartburn or indigestion. 30 tablet 0  . ferrous sulfate 325 (65 FE) MG tablet Take 325 mg by mouth daily with breakfast.    . Prenatal Vit-Fe Fumarate-FA (PRENATAL VITAMINS) 28-0.8 MG TABS Take 1 tablet by mouth daily. 60 tablet 3     Allergies  Allergen Reactions  . Penicillins Swelling and Other (See Comments)    Reaction:  Facial swelling  Has patient had a PCN reaction causing immediate rash, facial/tongue/throat swelling, SOB or lightheadedness  with hypotension: Yes Has patient had a PCN reaction causing severe rash involving mucus membranes or skin necrosis: No Has patient had a PCN reaction that required hospitalization No Has patient had a PCN reaction occurring within the last 10 years: Yes If all of the above answers are "NO", then may proceed with Cephalosporin use.    History of present pregnancy: Pt Info/Preference:  Screening/Consents:  Labs:   EDD: Estimated Date of Delivery: 09/13/19  Establised: Patient's last menstrual period was 12/07/2018.  Anatomy Scan: Date: 05/27/2019 Placenta Location: right lateral Genetic Screen: Panoroma:low risk female AFP: normal First Tri: Quad: Horizon WNL  Office:  [facility]            First PNV: 12.4 wg Blood Type --/--/O POS (04/19 1204)  Language: english Last PNV: 36.1 wg Rhogam    Flu Vaccine:  declined   Antibody  neg  TDaP vaccine declined   GTT: Early: 5.5 Third Trimester: 82  Feeding Plan: breast BTL: no Rubella:  immune  Contraception: ??? VBAC: no RPR:   nr  Circumcision: N/A   HBsAg:  neg  Pediatrician:  ???   HIV: Non Reactive (04/19 1205)   Prenatal Classes: no Additional [place]: no GBS:   GBS+      Chlamydia: Neg    MFM Referral/Consult:  GC: neg  Support Person: husband   PAP: ???  Pain Management: epiudural Neonatologist Referral:  Hgb Electrophoresis:  AS  Birth Plan: none   Hgb NOB: 11.8    28W: 11.2  Anatomy 05/27/2019  OB History    Gravida  3   Para  1   Term  1   Preterm  0   AB  1   Living  1     SAB  1   TAB  0   Ectopic  0   Multiple  0   Live Births  1          Past Medical History:  Diagnosis Date  . Pregnancy induced hypertension    gHTN; no meds  . Vaginal Pap smear, abnormal    Past Surgical History:  Procedure Laterality Date  . BLADDER SURGERY     28 years old  . DILATION AND EVACUATION N/A 07/21/2018   Procedure: SUCTION DILATATION AND EVACUATION;  Surgeon: Essie Hart, MD;  Location: [facility];  Service: Gynecology;  Laterality: N/A;   Family History: family history is not on file. Social History:  reports that she has never smoked. She has never used smokeless tobacco. She reports that she does not drink alcohol or use drugs.   Prenatal Transfer Tool  Maternal Diabetes: No Genetic Screening: Normal Maternal Ultrasounds/Referrals: Normal Fetal Ultrasounds or other Referrals:  None Maternal Substance Abuse:  No Significant Maternal Medications:  None Significant Maternal Lab Results: Group B Strep positive  ROS:  Review of Systems  Constitutional: Negative.   HENT: Negative.   Eyes: Negative.   Respiratory: Negative.   Cardiovascular: Negative.    Gastrointestinal: Positive for abdominal pain.  Genitourinary: Negative.   Musculoskeletal: Negative.   Skin: Negative.   Neurological: Negative.   Endo/Heme/Allergies: Negative.   Psychiatric/Behavioral: Negative.      Physical Exam: BP 117/68 (BP Location: Right Arm)   Pulse 96   Temp 98.2 F (36.8 C) (Oral)   Resp 18   Ht 6' (1.829 m)   Wt 119.9 kg   LMP 12/07/2018   BMI 35.86 kg/m   Physical Exam  Constitutional: She is oriented to  person, place, and time and well-developed, well-nourished, and in no distress.  HENT:  Head: Normocephalic and atraumatic.  Eyes: Pupils are equal, round, and reactive to light. Conjunctivae are normal.  Neck: Normal range of motion. Neck supple.  Cardiovascular: Normal rate and regular rhythm.  Pulmonary/Chest: Effort normal and breath sounds normal.  Abdominal: Soft. Bowel sounds are normal.  Genitourinary:    Genitourinary Comments: Pelvis adequate for vaginal deliver, uterus soft non tender gravida equal to dates.    Musculoskeletal: Normal range of motion.  Neurological: She is alert and oriented to person, place, and time. She has normal reflexes. Gait normal.  Skin: Skin is warm and dry.  Psychiatric: Affect normal.  Nursing note and vitals reviewed.    NST: FHR baseline 135 bpm, Variability: moderate, Accelerations:present, Decelerations:  Absent= Cat 1/Reactive UC:   cxt 2-7, lasting 50-90 seconds SVE:   Dilation: 1 Effacement (%): 60 Station: -3 Exam by:: [employer] , vertex verified by fetal sutures.  Leopold's: Position vertex, EFW 7lbs via leopold's.   Foley bulb placed with easy and instilled with of NS, pt tolerated well.   Labs: No results found for this or any previous visit (from the past 24 hour(s)).  Imaging:  [place] Mfm Fetal Bpp Wo Non Stress  Result Date: 07/28/2019 ----------------------------------------------------------------------  OBSTETRICS REPORT                       (Signed Final 07/28/2019  11:12 am) ---------------------------------------------------------------------- Patient Info  ID #:       [id number]                          D.O.B.:  May 10, 1991 (28 yrs)  Name:       Heather Riggs               Visit Date: 07/27/2019 11:44 pm ---------------------------------------------------------------------- Performed By  Performed By:     Ellin Saba        Referred By:      MAU Nursing-                    RDMS                                     MAU/Triage  Attending:        Lin Landsman      [facility] ---------------------------------------------------------------------- Orders   #  Description                          Code         Ordered By   1  [place] MFM FETAL BPP WO NON              78295.62     NICOLE NUGENT      STRESS  ----------------------------------------------------------------------   #  Order #  Accession #                 Episode #   1  [id number]                  [id number]                  [id number]  ---------------------------------------------------------------------- Indications   Decreased fetal movement                       O36.8190   [redacted] weeks gestation of pregnancy                Z3A.33   Obesity complicating pregnancy, third          O99.213   trimester  ---------------------------------------------------------------------- Fetal Evaluation  Num Of Fetuses:         1  Fetal Heart Rate(bpm):  152  Cardiac Activity:       Observed  Presentation:           Cephalic  Amniotic Fluid  AFI FV:      Within normal limits  AFI Sum(cm)     %Tile  14.25           50 ---------------------------------------------------------------------- Biophysical Evaluation  Amniotic F.V:   Within normal limits       F. Tone:        Observed  F. Movement:    Observed                   Score:          8/8  F. Breathing:   Observed  ---------------------------------------------------------------------- OB History  Gravidity:    3         Term:   1         SAB:   1  Living:       1 ---------------------------------------------------------------------- Gestational Age  Best:          33w 3d     Det. By:  Marcella Dubs         EDD:   09/11/19 ---------------------------------------------------------------------- Anatomy  Ventricles:            Appears normal         Kidneys:                Appear normal  Stomach:               Appears normal, left   Bladder:                Appears normal                         sided ---------------------------------------------------------------------- Impression  Biophysical profile 8/8  Decreased fetal movement ---------------------------------------------------------------------- Recommendations  Management per inpatient team. ----------------------------------------------------------------------               Lin Landsman, MD Electronically Signed Final Report   07/28/2019 11:12 am ----------------------------------------------------------------------   MAU Course: Orders Placed This Encounter  Procedures  . SARS CORONAVIRUS 2 (TAT 6-24 HRS) Nasopharyngeal Nasopharyngeal Swab  . [place] MFM FETAL BPP WO NON STRESS  . Fetal monitoring   No orders of the defined types were placed in this encounter.   Assessment/Plan: Danaye Sobh is a 28 y.o. female, G3P1011, IUP at 37.3 weeks, presenting for IOL for fetal late decel x1 and BPP of 6/10, no tone or movement noted. Pt was oringally seen in  MAU for cxt and blood show, but since have subsided. Baby low risk female. H/O sickle cell trait and GHTN, no meds, BP normotensive this pregnancy, denies HA< RUQ pain or vision changes Pt endorse + Fm. Denies vaginal leakage. Denies vaginal bleeding.  GBS +: Pt not allergic to penicillin, no rash, SOB, itchy, pt had swelling from an abscess tooth then took penicillin a long time ago and stated her cheek  became more swollen. Pt had taken amoxicillin also with no reaction. Pt consent to receiving penicillin for GBS+. No susceptibility test was performed in office.  I discussed with patient risks, benefits and alternatives of labor induction including higher risk of cesarean delivery compared to spontaneous labor.  We discussed risks of induction agents including effects on fetal heart beat, contraction pattern and need for close monitoring.  Patient expressed understanding of all this and desired to proceed with the induction. Risks and benefits of induction were reviewed, including failure of method, prolonged labor, need for further intervention, risk of cesarean.  Patient and family verbalized understanding and denies any further questions at this time. Pt and family wish to proceed with induction process. Discussed induction options of Cytotec, foley bulb, AROM, and pitocin were reviewed as well as risks and benefits with use of each discussed.  FWB: Cat 1 Fetal Tracing.  Foley bulb placed   Plan: Admit to Birthing Suite per consult with Dr Su Hiltoberts Routine [facility] orders Start pitocin 1x1 to max of 6 until foley bulb falls out then continue with pitocin for induction Plan to AROM once foley bulb is out.  Pain med/epidural prn PCN G for GBS prophylaxis Anticipate labor progression   Dale DurhamJade Alvita Fana NP-C, CNM, MSN 08/26/2019, 5:34 AM

## 2019-08-26 NOTE — Anesthesia Procedure Notes (Signed)
Epidural Patient location during procedure: OB Start time: 08/26/2019 7:52 AM End time: 08/26/2019 8:12 AM  Staffing Anesthesiologist: Barnet Glasgow, MD Performed: anesthesiologist   Preanesthetic Checklist Completed: patient identified, site marked, surgical consent, pre-op evaluation, timeout performed, IV checked, risks and benefits discussed and monitors and equipment checked  Epidural Patient position: sitting Prep: site prepped and draped and DuraPrep Patient monitoring: continuous pulse ox and blood pressure Approach: midline Location: L3-L4 Injection technique: LOR air  Needle:  Needle type: Tuohy  Needle gauge: 17 G Needle length: 9 cm and 9 Needle insertion depth: 9 cm Catheter type: closed end flexible Catheter size: 19 Gauge Catheter at skin depth: 14 cm Test dose: negative  Assessment Events: blood not aspirated, injection not painful, no injection resistance, negative IV test and no paresthesia  Additional Notes Patient identified. Risks/Benefits/Options discussed with patient including but not limited to bleeding, infection, nerve damage, paralysis, failed block, incomplete pain control, headache, blood pressure changes, nausea, vomiting, reactions to medication both or allergic, itching and postpartum back pain. Confirmed with bedside nurse the patient's most recent platelet count. Confirmed with patient that they are not currently taking any anticoagulation, have any bleeding history or any family history of bleeding disorders. Patient expressed understanding and wished to proceed. All questions were answered. Sterile technique was used throughout the entire procedure. Please see nursing notes for vital signs. Test dose was given through epidural needle and negative prior to continuing to dose epidural or start infusion. Warning signs of high block given to the patient including shortness of breath, tingling/numbness in hands, complete motor block, or any  concerning symptoms with instructions to call for help. Patient was given instructions on fall risk and not to get out of bed. All questions and concerns addressed with instructions to call with any issues. 3 Attempt (S) . Patient tolerated procedure well.

## 2019-08-26 NOTE — Progress Notes (Signed)
Called by [facility] [place], CNM at [phone] to review tracing.  Pt presented for labor check and was 1cm and unchanged but question about tracing.  I reviewed tracing remotely on my phone and discussed the tracing with Jade.  Beginning of tracing is unable to really evaluate and then a decel and then became cat 1. I recommended prolonged monitoring. Jade asked about doing a BPP since the pt was no longer contracting and I said that was fine.  I reviewed tracing again remotely on the computer for better visualization prior to BPP returning and spoke with Luvenia Starch (after texting her to call me) at [phone] to let her know that I would recommend keeping the pt and augmenting her contractions.  She does look like she is probably still contracting although decreased and not picking up as well as when she first came in and some may even be inverted but the nurse documented vaginal bleeding on the tracing upon arrival at time of exam and with tracing that does not truly meet criteria for any of the categories it is hard to evaluate with the added decel, I think keeping pt would be best.  With the patient being term and difficulty categorizing tracing upon presentation, vaginal bleeding and decel even though after the decel (where there is also LOC), the tracing is cat 1 and reactive, there is little downside to keeping patient and augmenting her contractions.  So, I recommend keeping the pt for augmentation regardless of BPP result and discussing all of this with the patient.  BPP resulted 6/10 (-2 each for movement and tone) and I was texted this at 0511 from [facility] shortly after getting off the phone with her and prior to [place] speaking with the patient.  Plan for recheck of cervix and augmentation with foley balloon and pitocin if no or minimal change.  The patient is agreeable with plan.  There is a low threshold for intervention given no movement or tone on BPP although clearly indicates no evidence of hypoxia at this time.  Luvenia Starch  called again at [phone] stating the pt had a tooth abscess at the time she received pcn and swollen cheek even prior to receiving pcn which is the incident where the pcn "allergy" on her chart arose.  Pt also says she has tolerated amoxicillin in the past as well.  The patient denied any generalized swelling with pcn or any reaction therefore unlikely has a true pcn allergy.  Luvenia Starch would like to remove this from her allergy list and start PCN d/t GBS positivity.  Given that the pt does not have documented susceptibility testing that I can find in the chart along with this new information, I am fine with that plan.  If there are s/sxs of a reaction, will d/c.

## 2019-08-26 NOTE — Anesthesia Preprocedure Evaluation (Signed)
Anesthesia Evaluation  Patient identified by MRN, date of birth, ID band Patient awake    Reviewed: Allergy & Precautions, NPO status , Patient's Chart, lab work & pertinent test results  Airway Mallampati: II  TM Distance: >3 FB Neck ROM: Full    Dental no notable dental hx. (+) Teeth Intact   Pulmonary neg pulmonary ROS,    Pulmonary exam normal breath sounds clear to auscultation       Cardiovascular Exercise Tolerance: Good Normal cardiovascular exam Rhythm:Regular Rate:Normal     Neuro/Psych negative neurological ROS     GI/Hepatic negative GI ROS, Neg liver ROS,   Endo/Other  negative endocrine ROS  Renal/GU negative Renal ROS     Musculoskeletal   Abdominal (+) + obese,   Peds  Hematology  (+) Sickle cell trait , Hgb 11.2 Plt 305   Anesthesia Other Findings   Reproductive/Obstetrics (+) Pregnancy                             Anesthesia Physical Anesthesia Plan  ASA: III  Anesthesia Plan: Epidural   Post-op Pain Management:    Induction:   PONV Risk Score and Plan:   Airway Management Planned:   Additional Equipment:   Intra-op Plan:   Post-operative Plan:   Informed Consent: I have reviewed the patients History and Physical, chart, labs and discussed the procedure including the risks, benefits and alternatives for the proposed anesthesia with the patient or authorized representative who has indicated his/her understanding and acceptance.       Plan Discussed with:   Anesthesia Plan Comments: (37 3/7 wk G3P1 w hx of Bryan trait for LEA)        Anesthesia Quick Evaluation

## 2019-08-26 NOTE — MAU Note (Signed)
PT SAYS UC STRONG SINCE  AT 2330.  FEELS LOWER PRESSURE - STARTED AT [place]. [facility]  WITH [facility]- SAW DR PINN- VE  1 CM.    DENIES HSV AND MRSA. GBS- POSITIVE

## 2019-08-26 NOTE — Progress Notes (Signed)
Heather Riggs is a 28 y.o. G3P1011 at [redacted]w[redacted]d by LMP admitted for induction of labor due to Non-reactive NST and BPP 6/10.  Subjective: Patient has epidural but "uncomfortable"  Objective: BP (!) 96/54   Pulse 93   Temp 97.6 F (36.4 C) (Oral)   Resp 16   Ht 6' (1.829 m)   Wt 118.4 kg   LMP 12/07/2018   BMI 35.40 kg/m  No intake/output data recorded.  FHT:  FHR: 130 bpm, variability: moderate,  accelerations:  Present,  decelerations:  Absent UC:   regular, every 2-3 minutes SVE:   Dilation: 7 Effacement (%): 80 Station: -1 Exam by:: Ariam Mol AROM performed  Labs: Lab Results  Component Value Date   WBC 12.3 (H) 08/26/2019   HGB 11.2 (L) 08/26/2019   HCT 34.3 (L) 08/26/2019   MCV 78.0 (L) 08/26/2019   PLT 305 08/26/2019    Assessment / Plan: Induction of labor due to non-reassuring fetal testing,  progressing well on pitocin  Labor: Progressing normally Preeclampsia:  no signs or symptoms of toxicity and intake and ouput balanced Fetal Wellbeing:  Category I Pain Control:  Epidural I/D:  n/a Anticipated MOD:  NSVD  Ly Wass [name] 08/26/2019, 11:08 AM

## 2019-08-27 LAB — CBC
HCT: 31 % — ABNORMAL LOW (ref 36.0–46.0)
Hemoglobin: 10.1 g/dL — ABNORMAL LOW (ref 12.0–15.0)
MCH: 25.2 pg — ABNORMAL LOW (ref 26.0–34.0)
MCHC: 32.6 g/dL (ref 30.0–36.0)
MCV: 77.3 fL — ABNORMAL LOW (ref 80.0–100.0)
Platelets: 277 10*3/uL (ref 150–400)
RBC: 4.01 MIL/uL (ref 3.87–5.11)
RDW: 14.4 % (ref 11.5–15.5)
WBC: 13 10*3/uL — ABNORMAL HIGH (ref 4.0–10.5)
nRBC: 0 % (ref 0.0–0.2)

## 2019-08-27 NOTE — Anesthesia Postprocedure Evaluation (Signed)
Anesthesia Post Note  Patient: Heather Riggs  Procedure(s) Performed: AN AD HOC LABOR EPIDURAL     Patient location during evaluation: Mother Baby Anesthesia Type: Epidural Level of consciousness: awake and alert and oriented Pain management: satisfactory to patient Vital Signs Assessment: post-procedure vital signs reviewed and stable Respiratory status: respiratory function stable Cardiovascular status: stable Postop Assessment: no headache, no backache, epidural receding, patient able to bend at knees, no signs of nausea or vomiting and adequate PO intake Anesthetic complications: no    Last Vitals:  Vitals:   08/27/19 0311 08/27/19 0617  BP: 113/77 109/69  Pulse: 84 77  Resp: 18 16  Temp: 36.7 C 36.7 C  SpO2: 99% 99%    Last Pain:  Vitals:   08/27/19 0617  TempSrc: Oral  PainSc: 5    Pain Goal:                   [name]

## 2019-08-27 NOTE — Progress Notes (Signed)
PPD# 1 SVD w/ 2nd degree perineal laceration Information for the patient's newborn:  Heather Riggs, Heather Riggs [[id number]]  female    Baby girl "Naveah" Circumcision N/A   S:   Reports feeling very good Tolerating PO fluid and solids No nausea or vomiting Bleeding is light, no clots Pain controlled with PO meds Up ad lib / ambulatory / voiding w/o difficulty Feeding: Bottle and Breast    O:   VS: BP 109/69 (BP Location: Right Arm)   Pulse 77   Temp 98.1 F (36.7 C) (Oral)   Resp 16   Ht 6' (1.829 m)   Wt 118.4 kg   LMP 12/07/2018   SpO2 99%   Breastfeeding Unknown   BMI 35.40 kg/m   LABS:  Recent Labs    08/26/19 0538 08/27/19 0528  WBC 12.3* 13.0*  HGB 11.2* 10.1*  PLT 305 277   Blood type: --/--/O POS (11/18 0538) Rubella:                        I&O: Intake/Output      11/18 0701 - 11/19 0700 11/19 0701 - 11/20 0700   Urine (mL/kg/hr) 1100 (0.4)    Blood 150    Total Output 1250    Net -1250           Physical Exam: Alert and oriented X3 Lungs: Clear and unlabored Heart: regular rate and rhythm / no mumurs Abdomen: soft, non-tender, non-distended  Fundus: firm, non-tender, U-2 Perineum: healing, non-edematous Lochia: appropriate Extremities: no edema, no calf pain or tenderness    A:  PPD # 1  Normal exam  P:  Routine post partum orders Up ad lib Anticipate D/C on 08/28/19   Plan reviewed w/ Dr. Burna Cash, MSN, CNM 08/27/2019, 7:21 AM

## 2019-08-27 NOTE — Lactation Note (Signed)
This note was copied from a baby's chart. Lactation Consultation Note  Patient Name: Girl Sonjia Wilcoxson QHUTM'L Date: 08/27/2019 Reason for consult: Early term 37-38.6wks;Follow-up assessment;Infant < 6lbs  1532-1535 F/U visit with P2 mom who delivered @ 37.3wks; baby is now 70 hours old with no wt loss at this time.  LC entered room to find mom in bed with baby lying on bed beside her; support person at bedside.  Per mom, she is still attempting to put baby to the breast with each feeding and then supplementing with formula.  Multiple used bottles of formula noted at bedside and no documentation of baby put to breast since 1830 last night.  Reviewed feeding 8-12 times in 24 hours and with feeding cues. Reviewed various feeding positions and alternating breasts with each feeding. Reviewed IP/OP lactation services. Unsure of mom's commitment to breastfeeding at this time.  Maternal Data Has patient been taught Hand Expression?: Yes Does the patient have breastfeeding experience prior to this delivery?: Yes  Interventions Interventions: Skin to skin;Position options;Support pillows;Adjust position;Breast feeding basics reviewed;Hand express    Consult Status Consult Status: Follow-up Date: 08/28/19 Follow-up type: In-patient    Cranston Neighbor 08/27/2019, 3:37 PM

## 2019-08-28 DIAGNOSIS — O139 Gestational [pregnancy-induced] hypertension without significant proteinuria, unspecified trimester: Secondary | ICD-10-CM | POA: Diagnosis not present

## 2019-08-28 NOTE — Discharge Summary (Signed)
SVD OB Discharge Summary     Patient Name: Heather Riggs DOB: 06-21-1991 MRN: [id number]  Date of admission: 08/26/2019 Delivering MD: Sanjuana Kava  Date of delivery: 08/26/2019 Type of delivery: SVD  Newborn Data: Sex: Baby female  Live born female  Birth Weight: 5 lb 7.8 oz (2490 g) APGAR: 9, 9  Newborn Delivery   Birth date/time: 08/26/2019 15:25:00 Delivery type: Vaginal, Spontaneous      Feeding: breast and bottle Infant being discharge to home with mother in stable condition.   Admitting diagnosis: 37WKS CTX, PRESSURE Intrauterine pregnancy: [redacted]w[redacted]d     Secondary diagnosis:  Active Problems:   Encounter for induction of labor   SVD (spontaneous vaginal delivery)                                Complications: None                                                              Intrapartum Procedures: spontaneous vaginal delivery Postpartum Procedures: none Complications-Operative and Postpartum: 2nd degree perineal laceration Augmentation: AROM, Pitocin, Cytotec and Foley Balloon   History of Present Illness: [name] is a 28 y.o. female, [id number], who presents at [redacted]w[redacted]d weeks gestation. The patient has been followed at  [facility]  Her pregnancy has been complicated by:  Patient Active Problem List   Diagnosis Date Noted  . SVD (spontaneous vaginal delivery) 08/28/2019  . Encounter for induction of labor 08/26/2019  . Status post dilatation and curettage 07/21/2018  . Labor and delivery, indication for care 02/06/2017    Hospital course:  Induction of Labor With Vaginal Delivery   28 y.o. yo [id number] at [redacted]w[redacted]d was admitted to the hospital 08/26/2019 for induction of labor.  Indication for induction: BPP 6/10, decel noted .  Patient had an uncomplicated labor course as follows: Membrane Rupture Time/Date: 10:18 AM ,08/26/2019   Intrapartum Procedures: Episiotomy: None [1]   Lacerations:  2nd degree [3]  Patient had delivery of a Viable infant.  Information for the patient's newborn:  [name], [name] [[id number]]  Delivery Method: Vag-Spont    08/26/2019  Details of delivery can be found in separate delivery note.  Patient had a routine postpartum course. Patient is discharged home 08/28/19. Postpartum Day # 2 : S/P NSVD due to IOL for NRFHT and 6/10 BPP. Pt had H/O GHT no meds, but BP has been normal this pregnancy.  Patient up ad lib, denies syncope or dizziness. Reports consuming regular diet without issues and denies N/V. Patient reports 0 bowel movement + passing flatus.  Denies issues with urination and reports bleeding is "lightewr."  Patient is breast and bottlefeeding and reports going well.  Desires undecided for postpartum contraception.  Pain is being appropriately managed with use of po meds.    Physical exam  Vitals:   08/27/19 0311 08/27/19 0617 08/27/19 1417 08/28/19 0536  BP: 113/77 109/69 123/75 107/63  Pulse: 84 77 85 63  Resp: 18 16 16 18   Temp: 98 F (36.7 C) 98.1 F (36.7 C) 97.9 F (36.6 C) 97.8 F (36.6 C)  TempSrc: Oral Oral Oral Oral  SpO2: 99% 99%    Weight:  Height:       General: alert, cooperative and no distress Lochia: appropriate Uterine Fundus: firm Perineum: Approximate, no hematomas noted DVT Evaluation: No evidence of DVT seen on physical exam. Negative Homan's sign. No cords or calf tenderness. No significant calf/ankle edema.  Labs: Lab Results  Component Value Date   WBC 13.0 (H) 08/27/2019   HGB 10.1 (L) 08/27/2019   HCT 31.0 (L) 08/27/2019   MCV 77.3 (L) 08/27/2019   PLT 277 08/27/2019   CMP Latest Ref Rng & Units 01/25/2019  Glucose 70 - 99 mg/dL 96  BUN 6 - 20 mg/dL 7  Creatinine 8.92 - 1.19 mg/dL 4.17  Sodium 408 - 144 mmol/L 134(L)  Potassium 3.5 - 5.1 mmol/L 3.8  Chloride 98 - 111 mmol/L 107  CO2 22 - 32 mmol/L 21(L)  Calcium 8.9 - 10.3 mg/dL 9.2  Total Protein 6.5 - 8.1 g/dL  6.7  Total Bilirubin 0.3 - 1.2 mg/dL 0.7  Alkaline Phos 38 - 126 U/L 52  AST 15 - 41 U/L 15  ALT 0 - 44 U/L 10    Date of discharge: 08/28/2019 Discharge Diagnoses: Term Pregnancy-delivered Discharge instruction: per After Visit Summary and "Baby and Me Booklet".  After visit meds:   Activity:           unrestricted and pelvic rest Advance as tolerated. Pelvic rest for 6 weeks.  Diet:                routine Medications: PNV Postpartum contraception: Undecided Condition:  Pt discharge to home with baby in stable  Meds: Allergies as of 08/28/2019      Reactions   Penicillins Swelling, Other (See Comments)   Reaction:  Facial swelling  Has patient had a PCN reaction causing immediate rash, facial/tongue/throat swelling, SOB or lightheadedness with hypotension: Yes Has patient had a PCN reaction causing severe rash involving mucus membranes or skin necrosis: No Has patient had a PCN reaction that required hospitalization No Has patient had a PCN reaction occurring within the last 10 years: Yes If all of the above answers are "NO", then may proceed with Cephalosporin use.      Medication List    STOP taking these medications   clindamycin 150 MG capsule Commonly known as: CLEOCIN   Production designer, theatre/television/film Maternity Supp Sm Misc   cyclobenzaprine 10 MG tablet Commonly known as: FLEXERIL   famotidine 20 MG tablet Commonly known as: PEPCID     TAKE these medications   acetaminophen 500 MG tablet Commonly known as: TYLENOL Take 1 tablet (500 mg total) by mouth every 6 (six) hours as needed.   Prenatal Vitamins 28-0.8 MG Tabs Take 1 tablet by mouth daily.       Discharge Follow Up:  Follow-up Information    [facility] Follow up.   Specialty: Obstetrics and Gynecology Why: 6 weeks PPV Contact information: [address] [place] [address] [phone]           [place], NP-C, CNM 08/28/2019, 7:30 AM   Dale Chataignier, FNP

## 2019-08-28 NOTE — Lactation Note (Signed)
This note was copied from a baby's chart. Lactation Consultation Note  Patient Name: Girl Nareh Matzke PIRJJ'O Date: 08/28/2019 Reason for consult: Follow-up assessment Baby is 41 hours/1% weight loss.  Mom states she puts baby to the breast prior to supplementation and infant is latching well.  Mom is also pumping and hand expressing every 3 hours.  [place] referral has been sent for pump.  Mom to call them this morning to see when she can pick pump up.  Discussed milk coming to volume and the prevention and treatment of engorgement.  Reviewed outpatient services and encouraged to call prn.  Maternal Data    Feeding Feeding Type: Breast Milk with Formula added  LATCH Score                   Interventions    Lactation Tools Discussed/Used     Consult Status Consult Status: Complete Follow-up type: Call as needed    Ave Filter 08/28/2019, 8:29 AM

## 2020-04-20 ENCOUNTER — Inpatient Hospital Stay (HOSPITAL_COMMUNITY)
Admission: AD | Admit: 2020-04-20 | Discharge: 2020-04-20 | Disposition: A | Discharge status: Home / Self Care | Payer: Medicaid Other | Attending: Obstetrics and Gynecology | Admitting: Obstetrics and Gynecology

## 2020-04-20 ENCOUNTER — Encounter (HOSPITAL_COMMUNITY): Payer: Self-pay | Admitting: Obstetrics and Gynecology

## 2020-04-20 ENCOUNTER — Other Ambulatory Visit: Payer: Self-pay

## 2020-04-20 ENCOUNTER — Inpatient Hospital Stay (HOSPITAL_COMMUNITY): Payer: Medicaid Other

## 2020-04-20 DIAGNOSIS — Z880 Allergy status to penicillin: Secondary | ICD-10-CM | POA: Diagnosis not present

## 2020-04-20 DIAGNOSIS — Z3A01 Less than 8 weeks gestation of pregnancy: Secondary | ICD-10-CM | POA: Insufficient documentation

## 2020-04-20 DIAGNOSIS — N83201 Unspecified ovarian cyst, right side: Secondary | ICD-10-CM | POA: Diagnosis not present

## 2020-04-20 DIAGNOSIS — Z8759 Personal history of other complications of pregnancy, childbirth and the puerperium: Secondary | ICD-10-CM | POA: Insufficient documentation

## 2020-04-20 DIAGNOSIS — O3680X0 Pregnancy with inconclusive fetal viability, not applicable or unspecified: Secondary | ICD-10-CM | POA: Insufficient documentation

## 2020-04-20 DIAGNOSIS — R109 Unspecified abdominal pain: Secondary | ICD-10-CM | POA: Insufficient documentation

## 2020-04-20 DIAGNOSIS — O99891 Other specified diseases and conditions complicating pregnancy: Secondary | ICD-10-CM

## 2020-04-20 DIAGNOSIS — O26899 Other specified pregnancy related conditions, unspecified trimester: Secondary | ICD-10-CM

## 2020-04-20 DIAGNOSIS — O99212 Obesity complicating pregnancy, second trimester: Secondary | ICD-10-CM | POA: Insufficient documentation

## 2020-04-20 DIAGNOSIS — O26891 Other specified pregnancy related conditions, first trimester: Secondary | ICD-10-CM | POA: Insufficient documentation

## 2020-04-20 LAB — CBC
HCT: 41.9 % (ref 36.0–46.0)
Hemoglobin: 13.7 g/dL (ref 12.0–15.0)
MCH: 26.3 pg (ref 26.0–34.0)
MCHC: 32.7 g/dL (ref 30.0–36.0)
MCV: 80.4 fL (ref 80.0–100.0)
Platelets: 322 10*3/uL (ref 150–400)
RBC: 5.21 MIL/uL — ABNORMAL HIGH (ref 3.87–5.11)
RDW: 14.4 % (ref 11.5–15.5)
WBC: 4.7 10*3/uL (ref 4.0–10.5)
nRBC: 0 % (ref 0.0–0.2)

## 2020-04-20 LAB — WET PREP, GENITAL
Clue Cells Wet Prep HPF POC: NONE SEEN
Sperm: NONE SEEN
Trich, Wet Prep: NONE SEEN
Yeast Wet Prep HPF POC: NONE SEEN

## 2020-04-20 LAB — URINALYSIS, ROUTINE W REFLEX MICROSCOPIC
Bilirubin Urine: NEGATIVE
Glucose, UA: NEGATIVE mg/dL
Hgb urine dipstick: NEGATIVE
Ketones, ur: NEGATIVE mg/dL
Nitrite: NEGATIVE
Protein, ur: NEGATIVE mg/dL
Specific Gravity, Urine: 1.012 (ref 1.005–1.030)
pH: 6 (ref 5.0–8.0)

## 2020-04-20 LAB — HCG, QUANTITATIVE, PREGNANCY: hCG, Beta Chain, Quant, S: 32 m[IU]/mL — ABNORMAL HIGH (ref <5)

## 2020-04-20 LAB — POCT PREGNANCY, URINE: Preg Test, Ur: POSITIVE — AB

## 2020-04-20 NOTE — Discharge Instructions (Signed)

## 2020-04-20 NOTE — MAU Provider Note (Signed)
History     CSN: [id number]  Arrival date and time: 04/20/20 9381  First Provider Initiated Contact with Patient 04/20/20 0933      Chief Complaint  Patient presents with  . Abdominal Pain  . Possible Pregnancy   29 y.o. [id number] @[redacted]w[redacted]d  by sure LMP presenting with LAP. Reports onset 2 days ago. Describes as intermittent cramping in bilateral low abdomen. She took Tylenol but it didn't help. Rates pain 6/10. Denies VB or discharge. Denies urinary sx. Denies GI sx.   OB History    Gravida  4   Para  2   Term  2   Preterm  0   AB  1   Living  2     SAB  1   TAB  0   Ectopic  0   Multiple  0   Live Births  2           Past Medical History:  Diagnosis Date  . Pregnancy induced hypertension    gHTN; no meds  . Vaginal Pap smear, abnormal     Past Surgical History:  Procedure Laterality Date  . BLADDER SURGERY     29 years old  . DILATION AND EVACUATION N/A 07/21/2018   Procedure: SUCTION DILATATION AND EVACUATION;  Surgeon: 07/23/2018, MD;  Location: [facility] [place];  Service: Gynecology;  Laterality: N/A;    History reviewed. No pertinent family history.  Social History   Tobacco Use  . Smoking status: Never Smoker  . Smokeless tobacco: Never Used  Vaping Use  . Vaping Use: Never used  Substance Use Topics  . Alcohol use: No  . Drug use: No    Allergies:  Allergies  Allergen Reactions  . Penicillins Swelling and Other (See Comments)    Reaction:  Facial swelling  Has patient had a PCN reaction causing immediate rash, facial/tongue/throat swelling, SOB or lightheadedness with hypotension: Yes Has patient had a PCN reaction causing severe rash involving mucus membranes or skin necrosis: No Has patient had a PCN reaction that required hospitalization No Has patient had a PCN reaction occurring within the last 10 years: Yes If all of the above answers are "NO", then may proceed with Cephalosporin use.    No medications prior to  admission.    Review of Systems  Constitutional: Negative for chills and fever.  Gastrointestinal: Positive for abdominal pain. Negative for constipation, diarrhea, nausea and vomiting.  Genitourinary: Negative for dysuria, frequency, urgency, vaginal bleeding and vaginal discharge.   Physical Exam   Blood pressure 109/86, pulse (!) 110, temperature 98.7 F (37.1 C), temperature source Oral, resp. rate 16, height 5\' 11"  (1.803 m), weight 122.2 kg, last menstrual period 03/13/2020, SpO2 100 %, unknown if currently breastfeeding.  Physical Exam Vitals and nursing note reviewed. Exam conducted with a chaperone present.  Constitutional:      General: She is not in acute distress (appears comfortable). HENT:     Head: Normocephalic.  Cardiovascular:     Rate and Rhythm: Normal rate.  Pulmonary:     Effort: Pulmonary effort is normal. No respiratory distress.  Abdominal:     General: There is no distension.     Tenderness: There is no abdominal tenderness. There is no guarding or rebound.  Genitourinary:    Uterus: Normal. Not enlarged and not tender.      Adnexa: Right adnexa normal and left adnexa normal.       Right: No mass or tenderness.  Left: No mass or tenderness.    Skin:    General: Skin is warm and dry.  Neurological:     General: No focal deficit present.     Mental Status: She is alert and oriented to person, place, and time.  Psychiatric:        Mood and Affect: Mood normal.    Results for orders placed or performed during the hospital encounter of 04/20/20 (from the past 24 hour(s))  Pregnancy, urine POC     Status: Abnormal   Collection Time: 04/20/20  9:07 AM  Result Value Ref Range   Preg Test, Ur POSITIVE (A) NEGATIVE  Urinalysis, Routine w reflex microscopic     Status: Abnormal   Collection Time: 04/20/20  9:08 AM  Result Value Ref Range   Color, Urine YELLOW YELLOW   APPearance HAZY (A) CLEAR   Specific Gravity, Urine 1.012 1.005 - 1.030   pH  6.0 5.0 - 8.0   Glucose, UA NEGATIVE NEGATIVE mg/dL   Hgb urine dipstick NEGATIVE NEGATIVE   Bilirubin Urine NEGATIVE NEGATIVE   Ketones, ur NEGATIVE NEGATIVE mg/dL   Protein, ur NEGATIVE NEGATIVE mg/dL   Nitrite NEGATIVE NEGATIVE   Leukocytes,Ua SMALL (A) NEGATIVE   RBC / HPF 0-5 0 - 5 RBC/hpf   WBC, UA 0-5 0 - 5 WBC/hpf   Bacteria, UA FEW (A) NONE SEEN   Squamous Epithelial / LPF 0-5 0 - 5   Mucus PRESENT   Wet prep, genital     Status: Abnormal   Collection Time: 04/20/20  9:33 AM   Specimen: PATH Cytology Cervicovaginal Ancillary Only  Result Value Ref Range   Yeast Wet Prep HPF POC NONE SEEN NONE SEEN   Trich, Wet Prep NONE SEEN NONE SEEN   Clue Cells Wet Prep HPF POC NONE SEEN NONE SEEN   WBC, Wet Prep HPF POC MANY (A) NONE SEEN   Sperm NONE SEEN   ABO/Rh     Status: None   Collection Time: 04/20/20 10:00 AM  Result Value Ref Range   ABO/RH(D)      O POS Performed at [facility], [address] [place], [place] [address]   CBC     Status: Abnormal   Collection Time: 04/20/20 10:00 AM  Result Value Ref Range   WBC 4.7 4.0 - 10.5 K/uL   RBC 5.21 (H) 3.87 - 5.11 MIL/uL   Hemoglobin 13.7 12.0 - 15.0 g/dL   HCT 19.6 36 - 46 %   MCV 80.4 80.0 - 100.0 fL   MCH 26.3 26.0 - 34.0 pg   MCHC 32.7 30.0 - 36.0 g/dL   RDW 22.2 97.9 - 89.2 %   Platelets 322 150 - 400 K/uL   nRBC 0.0 0.0 - 0.2 %  hCG, quantitative, pregnancy     Status: Abnormal   Collection Time: 04/20/20 10:00 AM  Result Value Ref Range   hCG, Beta Chain, Quant, S 32 (H) <5 mIU/mL   US OB LESS THAN 14 WEEKS WITH OB TRANSVAGINAL  Result Date: 04/20/2020 CLINICAL DATA:  Abdominal pain and morbid obesity EXAM: OBSTETRIC <14 WK [place] AND TRANSVAGINAL OB US TECHNIQUE: Both transabdominal and transvaginal ultrasound examinations were performed for complete evaluation of the gestation as well as the maternal uterus, adnexal regions, and pelvic cul-de-sac. Transvaginal technique was performed to assess early  pregnancy. COMPARISON:  None FINDINGS: Intrauterine gestational sac: None Yolk sac:  Not Visualized. Embryo:  Not Visualized. Maternal uterus/adnexae: Subchorionic hemorrhage: Not applicable Right  ovary: Complex right ovary cyst measures 3.3 by 2.9 by 2.7 cm. This likely represents a benign hemorrhagic cyst with retracting clot. Corpus luteal cyst is also noted measuring 2.4 by 1.2 x 1.1 cm. Left ovary: Appears normal Other :Endometrium appears normal measuring 1.6 cm in thickness. Free fluid: Moderate simple appearing free fluid identified within the pelvis IMPRESSION: 1. No intrauterine gestational sac, yolk sac, or fetal pole identified. Differential considerations include intrauterine pregnancy too early to be sonographically visualized, missed abortion, or ectopic pregnancy. Followup ultrasound is recommended in 10-14 days for further evaluation. 2. Mildly complex right ovary cysts are favored to represent corpus luteum with adjacent hemorrhagic cyst. Attention on short-term follow-up examination is advised. 3. Moderate simple appearing free fluid within the pelvis. 4. These results were called by telephone at the time of interpretation on 04/20/2020 at 1:05 pm to provider [facility] , who verbally acknowledged these results. Electronically Signed   By: Signa Kell M.D.   On: 04/20/2020 13:05   MAU Course  Procedures  MDM Labs and [place] ordered and reviewed. No IUP or adnexal mass seen on [place], findings could indicate early pregnancy, ectopic pregnancy, or failed pregnancy-discussed with pt. Will follow quant in 48 hrs. Pain may be from ovarian cysts. Stable for discharge home.   Assessment and Plan   1. Pregnancy, location unknown   2. Abdominal pain affecting pregnancy   3. Right ovarian cyst    Discharge home Follow up at [facility] in 2 days for qhcg-J.Crumpler, CNM notified Ectopic/SAB precautions  Allergies as of 04/20/2020      Reactions   Penicillins Swelling, Other (See Comments)    Reaction:  Facial swelling  Has patient had a PCN reaction causing immediate rash, facial/tongue/throat swelling, SOB or lightheadedness with hypotension: Yes Has patient had a PCN reaction causing severe rash involving mucus membranes or skin necrosis: No Has patient had a PCN reaction that required hospitalization No Has patient had a PCN reaction occurring within the last 10 years: Yes If all of the above answers are "NO", then may proceed with Cephalosporin use.      Medication List    TAKE these medications   acetaminophen 500 MG tablet Commonly known as: TYLENOL Take 1 tablet (500 mg total) by mouth every 6 (six) hours as needed.   Prenatal Vitamins 28-0.8 MG Tabs Take 1 tablet by mouth daily.      Donette Larry, CNM 04/20/2020, 2:18 PM

## 2020-04-20 NOTE — MAU Note (Signed)
Having some abd pain, started 2 days ago.  +HPT yesterday, did not do 2nd one to confirm.  Unsure how far along she is.

## 2020-04-21 LAB — GC/CHLAMYDIA PROBE AMP (~~LOC~~) NOT AT ARMC
Chlamydia: NEGATIVE
Comment: NEGATIVE
Comment: NORMAL
Neisseria Gonorrhea: NEGATIVE

## 2020-04-21 LAB — ABO/RH: ABO/RH(D): O POS

## 2020-06-28 ENCOUNTER — Other Ambulatory Visit: Payer: Self-pay

## 2020-06-28 ENCOUNTER — Inpatient Hospital Stay (HOSPITAL_COMMUNITY)
Admission: AD | Admit: 2020-06-28 | Discharge: 2020-06-28 | Discharge status: Against Medical Advice | Payer: Medicaid Other | Attending: Obstetrics & Gynecology | Admitting: Obstetrics & Gynecology

## 2020-06-28 NOTE — MAU Note (Signed)
RN received call from registration stating that patient is leaving and has an appointment in the office and is planning to go there. Charge RN (Ginger) made aware.

## 2020-08-25 ENCOUNTER — Emergency Department (HOSPITAL_COMMUNITY): Payer: Medicaid Other

## 2020-08-25 ENCOUNTER — Emergency Department (HOSPITAL_COMMUNITY)
Admission: EM | Admit: 2020-08-25 | Discharge: 2020-08-25 | Disposition: A | Discharge status: Home / Self Care | Payer: Medicaid Other | Attending: Emergency Medicine | Admitting: Emergency Medicine

## 2020-08-25 ENCOUNTER — Encounter (HOSPITAL_COMMUNITY): Payer: Self-pay | Admitting: Emergency Medicine

## 2020-08-25 ENCOUNTER — Other Ambulatory Visit: Payer: Self-pay

## 2020-08-25 DIAGNOSIS — S92251A Displaced fracture of navicular [scaphoid] of right foot, initial encounter for closed fracture: Secondary | ICD-10-CM | POA: Insufficient documentation

## 2020-08-25 DIAGNOSIS — W108XXA Fall (on) (from) other stairs and steps, initial encounter: Secondary | ICD-10-CM | POA: Insufficient documentation

## 2020-08-25 DIAGNOSIS — S99921A Unspecified injury of right foot, initial encounter: Secondary | ICD-10-CM | POA: Diagnosis present

## 2020-08-25 MED ORDER — HYDROCODONE-ACETAMINOPHEN 5-325 MG PO TABS: 1.0000 | ORAL_TABLET | ORAL | 0 refills | Status: DC | PRN

## 2020-08-25 MED ORDER — ACETAMINOPHEN 325 MG PO TABS
650.0000 mg | ORAL_TABLET | Freq: Once | ORAL | Status: AC
  Administered 2020-08-25: 650 mg via ORAL
  Filled 2020-08-25: qty 2

## 2020-08-25 NOTE — ED Triage Notes (Signed)
Patient arrives to Ed with complaints of numbness to her right foot. Pt stated that she fell down a set of stairs and caught herself before hitting her face or stomach, but it caused foot pain/numbness. Pt is [redacted] weeks pregnant. Denies abdominal pain, cramping, or vaginal bleeding.

## 2020-08-25 NOTE — ED Provider Notes (Signed)
[facility] EMERGENCY DEPARTMENT Provider Note   CSN: [id number] Arrival date & time: 08/25/20  0741     History Chief Complaint  Patient presents with  . Fall  . Foot Pain    Heather Riggs is a 29 y.o. female.  HPI 29 year old female presents with right foot and ankle injury.  Yesterday she fell down about 4 steps and twisted her foot and ankle.  Since then she has had numbness to the foot.  She states she does not feel anything in her foot unless she walks on it and then she feels pain.  Tried some Tylenol yesterday.  It has become swollen.  She did not hit her abdomen or any other injuries and denies abdominal pain or vaginal bleeding.  She is currently pregnant.   Past Medical History:  Diagnosis Date  . Pregnancy induced hypertension    gHTN; no meds  . Vaginal Pap smear, abnormal     Patient Active Problem List   Diagnosis Date Noted  . SVD (spontaneous vaginal delivery) 08/28/2019  . Encounter for induction of labor 08/26/2019  . Status post dilatation and curettage 07/21/2018  . Labor and delivery, indication for care 02/06/2017    Past Surgical History:  Procedure Laterality Date  . BLADDER SURGERY     29 years old  . DILATION AND EVACUATION N/A 07/21/2018   Procedure: SUCTION DILATATION AND EVACUATION;  Surgeon: Essie Hart, MD;  Location: [facility];  Service: Gynecology;  Laterality: N/A;     OB History    Gravida  4   Para  2   Term  2   Preterm  0   AB  1   Living  2     SAB  1   TAB  0   Ectopic  0   Multiple  0   Live Births  2           History reviewed. No pertinent family history.  Social History   Tobacco Use  . Smoking status: Never Smoker  . Smokeless tobacco: Never Used  Vaping Use  . Vaping Use: Never used  Substance Use Topics  . Alcohol use: No  . Drug use: No    Home Medications Prior to Admission medications   Medication Sig Start Date End Date Taking? Authorizing  Provider  acetaminophen (TYLENOL) 500 MG tablet Take 1 tablet (500 mg total) by mouth every 6 (six) hours as needed. 03/05/19   Fayrene Helper, PA-C  HYDROcodone-acetaminophen (NORCO) 5-325 MG tablet Take 1 tablet by mouth every 4 (four) hours as needed for severe pain. 08/25/20   Pricilla Loveless, MD  Prenatal Vit-Fe Fumarate-FA (PRENATAL VITAMINS) 28-0.8 MG TABS Take 1 tablet by mouth daily. 01/25/19   Rasch, Harolyn Rutherford, NP    Allergies    Penicillins  Review of Systems   Review of Systems  Musculoskeletal: Positive for arthralgias and joint swelling.  Neurological: Positive for numbness. Negative for weakness.    Physical Exam Updated Vital Signs BP 117/75   Pulse (!) 103   Temp 98.9 F (37.2 C) (Oral)   Resp 18   Ht 6' (1.829 m)   Wt 120.2 kg   LMP 03/13/2020   SpO2 96%   BMI 35.94 kg/m   Physical Exam Vitals and nursing note reviewed.  Constitutional:      Appearance: She is well-developed.  HENT:     Head: Normocephalic and atraumatic.     Right Ear: External ear normal.  Left Ear: External ear normal.     Nose: Nose normal.  Eyes:     General:        Right eye: No discharge.        Left eye: No discharge.  Cardiovascular:     Rate and Rhythm: Normal rate and regular rhythm.     Pulses:          Dorsalis pedis pulses are 2+ on the right side.  Pulmonary:     Effort: Pulmonary effort is normal.  Abdominal:     General: There is no distension.  Musculoskeletal:     Right ankle: Swelling present. No deformity. Tenderness present.     Right Achilles Tendon: No tenderness or defects.     Right foot: Swelling (proximal) and tenderness present.     Comments: Normal warmth in right foot/ankle. Normal gross sensation when palpated. Can wiggle toes. Decreased ankle ROM due to pain.  Skin:    General: Skin is warm and dry.  Neurological:     Mental Status: She is alert.  Psychiatric:        Mood and Affect: Mood is not anxious.     ED Results / Procedures /  Treatments   Labs (all labs ordered are listed, but only abnormal results are displayed) Labs Reviewed - No data to display  EKG None  Radiology DG Ankle Complete Right  Result Date: 08/25/2020 CLINICAL DATA:  Pt fell down 6 stairs yesterday - having pain and swelling in right foot/ankle still today - most pain is located in anterior/superior portions of foot and ankle EXAM: RIGHT ANKLE - COMPLETE 3+ VIEW COMPARISON:  None. FINDINGS: Small avulsion fracture from the dorsal margin of the navicular, seen on the lateral view, with mild overlying soft tissue swelling. No other fractures. No bone lesions. The ankle joint is normally spaced and aligned. No arthropathic changes. IMPRESSION: 1. Small avulsion fracture from the dorsal margin of the navicular with associated soft tissue swelling. Electronically Signed   By: Amie Portland M.D.   On: 08/25/2020 08:31   DG Foot Complete Right  Result Date: 08/25/2020 CLINICAL DATA:  Right ankle and foot injury. EXAM: RIGHT FOOT COMPLETE - 3+ VIEW COMPARISON:  No prior. FINDINGS: Tiny bony density noted along the posterior aspect of the navicula, this may represent a tiny fracture fragment. No other focal bony abnormalities identified. No evidence of dislocation. No radiopaque foreign body. IMPRESSION: Tiny bony density noted along the posterior aspect of the navicula, this may represent a tiny fracture fragment. Electronically Signed   By: Maisie Fus  Register   On: 08/25/2020 08:32    Procedures Procedures (including critical care time)  Medications Ordered in ED Medications  acetaminophen (TYLENOL) tablet 650 mg (has no administration in time range)    ED Course  I have reviewed the triage vital signs and the nursing notes.  Pertinent labs & imaging results that were available during my care of the patient were reviewed by me and considered in my medical decision making (see chart for details).    MDM Rules/Calculators/A&P                            Patient's x-rays have been personally reviewed by myself.  Small navicular avulsion fracture.  Discussed with orthopedics, Earney Hamburg, will place in a cam walker and she can be weightbearing as tolerated.  I had discussion of pain control and the patient will certainly try Tylenol  at home but would like a few doses of Norco as a just in case.  I discussed it is hard to rule out how much fetal involvement will be from this medicine and she understands.  Follow-up with orthopedics in 1-2 weeks. Final Clinical Impression(s) / ED Diagnoses Final diagnoses:  Closed avulsion fracture of navicular bone of right foot, initial encounter    Rx / DC Orders ED Discharge Orders         Ordered    HYDROcodone-acetaminophen (NORCO) 5-325 MG tablet  Every 4 hours PRN        08/25/20 0848           Pricilla Loveless, MD 08/25/20 [phone]

## 2020-08-25 NOTE — ED Notes (Signed)
Ace wrapping was removed from pt's rt foot that she applied at home. Foot is swollen, she can wiggle her toes, she explains that she is unable to bear weight d/t the pain.

## 2020-08-25 NOTE — Discharge Instructions (Signed)
Wear the boot until you see the orthopedist. You may bear weight as tolerated by your pain. Call for an appointment in 1-2 weeks

## 2020-08-25 NOTE — ED Notes (Addendum)
Pt was given D/C papers which was explained & understood with husband at bedside. She is waiting on ortho to bring her the boot & crutches before she leaves.

## 2020-08-25 NOTE — Progress Notes (Signed)
Orthopedic Tech Progress Note Patient Details:  Heather Riggs [birth year] [id number]  Ortho Devices Type of Ortho Device: Crutches, CAM walker Ortho Device/Splint Location: RLE Ortho Device/Splint Interventions: Ordered, Application, Adjustment   Post Interventions Patient Tolerated: Well Instructions Provided: Care of device, Adjustment of device, Poper ambulation with device   [name] 08/25/2020, 9:54 AM

## 2020-08-25 NOTE — ED Notes (Signed)
Ortho has been contacted for the boot and crutches ordered.

## 2020-10-08 NOTE — L&D Delivery Note (Signed)
Delivery Note   Patient Name: Heather Riggs DOB: 01-17-1991 MRN: [id number]  Date of admission: 12/23/2020 Delivering MD:  Dale Dent, CNM Date of delivery: 12/23/20 Type of delivery: SVD  Newborn Data: Live born female  Birth Weight:   APGAR: 8, 9  Newborn Delivery   Birth date/time: 12/23/2020 21:34:00 Delivery type: Vaginal, Spontaneous     Katasha Rachelle Morriss, 29 y.o., @ [redacted]w[redacted]d,  [id number], who was admitted for [name] is a 30 y.o. female, [id number], IUP at 39 weeks, presenting for Elective IOL at term. Sickle cell trait. Depression with speratic 50mg  zoloft.  Growth : 2/10, 5.0lbs, 36%, AFI 17.3, posterior placenta, vertex, lagging HC <3%. Pelvis proven to 7.6lbs. H/O 13 week loss. LR Female. GBS-. H/O GHTN with previous pregnancy. Desires tubal consent signed 12/282021. Comfortable with epidural but feeling pressure, progressed with AROM, clear and pitocin of 6. I was called to the room when she progressed 2+ station in the second stage of labor.  She pushed for 10/min.  She delivered a viable infant, cephalic and restituted to the LOA position over an intact perineum.  A nuchal cord   was not identified, but a right compound hand. The baby was placed on maternal abdomen while initial step of NRP were perfmored (Dry, Stimulated, and warmed). Hat placed on baby for thermoregulation. Delayed cord clamping was performed for 2 minutes.  Cord double clamped and cut.  Cord cut by RN. Apgar scores were 8 and 9. Prophylactic Pitocin was started in the third stage of labor for active management. The placenta delivered spontaneously, shultz, with a 3 vessel cord and was sent to LD.  Inspection revealed right labial tear no repair hemostatic. An examination of the vaginal vault and cervix was free from lacerations. The uterus was firm, bleeding stable.   Placenta and umbilical artery blood gas were not sent.  There were no complications during the procedure.  Mom  and baby skin to skin following delivery. Left in stable condition. Desires PP tubal. NPO after midnight. Will restart zoloft 50mg  daily.  Maternal Info: Anesthesia: Epidural Episiotomy: No Lacerations:  right labial , hemostatic, not repaired Suture Repair: No Est. Blood Loss (mL):  10/2819  Newborn Info:  Baby Sex: female Circumcision: N/A Babies Name: Aleayah APGAR (1 MIN): 8   APGAR (5 MINS): 9   APGAR (10 MINS):     Mom to postpartum.  Baby to Couplet care / Skin to Skin.  Dr updated and aware of tubal to be scheduled tomorrow.   [place], Mora Appl, NP-C 12/23/20 9:52 PM

## 2020-11-21 ENCOUNTER — Inpatient Hospital Stay (HOSPITAL_COMMUNITY)
Admission: AD | Admit: 2020-11-21 | Discharge: 2020-11-21 | Disposition: A | Discharge status: Home / Self Care | Payer: Medicaid Other | Attending: Obstetrics and Gynecology | Admitting: Obstetrics and Gynecology

## 2020-11-21 ENCOUNTER — Other Ambulatory Visit: Payer: Self-pay

## 2020-11-21 ENCOUNTER — Encounter (HOSPITAL_COMMUNITY): Payer: Self-pay | Admitting: *Deleted

## 2020-11-21 DIAGNOSIS — O4703 False labor before 37 completed weeks of gestation, third trimester: Secondary | ICD-10-CM | POA: Insufficient documentation

## 2020-11-21 DIAGNOSIS — Z3A34 34 weeks gestation of pregnancy: Secondary | ICD-10-CM | POA: Diagnosis not present

## 2020-11-21 DIAGNOSIS — O479 False labor, unspecified: Secondary | ICD-10-CM

## 2020-11-21 DIAGNOSIS — O26893 Other specified pregnancy related conditions, third trimester: Secondary | ICD-10-CM | POA: Diagnosis present

## 2020-11-21 HISTORY — DX: Headache, unspecified: R51.9

## 2020-11-21 HISTORY — DX: Gestational (pregnancy-induced) hypertension without significant proteinuria, unspecified trimester: O13.9

## 2020-11-21 LAB — URINALYSIS, ROUTINE W REFLEX MICROSCOPIC
Bilirubin Urine: NEGATIVE
Glucose, UA: NEGATIVE mg/dL
Hgb urine dipstick: NEGATIVE
Ketones, ur: NEGATIVE mg/dL
Nitrite: NEGATIVE
Protein, ur: NEGATIVE mg/dL
Specific Gravity, Urine: 1.02 (ref 1.005–1.030)
pH: 6 (ref 5.0–8.0)

## 2020-11-21 LAB — URINALYSIS, MICROSCOPIC (REFLEX): Bacteria, UA: NONE SEEN

## 2020-11-21 NOTE — MAU Provider Note (Signed)
History     CSN: [id number]  Arrival date and time: 11/21/20 2104   Event Date/Time   First Provider Initiated Contact with Patient 11/21/20 2158      Chief Complaint  Patient presents with  . Abdominal Pain  . Pelvic Pain   HPI Heather Riggs is a 30 y.o. [id number] at [redacted]w[redacted]d who presents with abdominal pain. Reports intermittent cramping & pelvic pressure that started around 6 pm today. Feels every 5 minutes and lasts for 30 seconds at a time. Rates pain 9/10. Has not treated symptoms. Nothing makes better or worse. Denies n/v/d, dysuria, vaginal bleeding, or LOF. No recent intercourse. Reports good fetal movement.  Goes to [facility] for prenatal care, next appointment next Friday.   OB History    Gravida  4   Para  2   Term  1   Preterm  1   AB  1   Living  2     SAB  1   IAB      Ectopic      Multiple      Live Births  2           Past Medical History:  Diagnosis Date  . Headache   . PIH (pregnancy induced hypertension)    With first pregnancy in 2018    Past Surgical History:  Procedure Laterality Date  . NO PAST SURGERIES      History reviewed. No pertinent family history.  Social History   Tobacco Use  . Smoking status: Never Smoker  . Smokeless tobacco: Never Used  Substance Use Topics  . Alcohol use: Never  . Drug use: Never    Allergies: No Known Allergies  Medications Prior to Admission  Medication Sig Dispense Refill Last Dose  . acetaminophen (TYLENOL) 325 MG tablet Take 650 mg by mouth.   11/21/2020 at 1630  . Prenatal Vit-Fe Fumarate-FA (MULTIVITAMIN-PRENATAL) 27-0.8 MG TABS tablet Take 1 tablet by mouth daily at 12 noon.   11/21/2020 at Unknown time    Review of Systems  Constitutional: Negative.   Gastrointestinal: Positive for abdominal pain. Negative for diarrhea, nausea and vomiting.  Genitourinary: Negative.    Physical Exam   Blood pressure 123/67, pulse 93, temperature 98.5 F (36.9 C), resp. rate 18, SpO2 100  %.  Physical Exam Vitals and nursing note reviewed. Exam conducted with a chaperone present.  Constitutional:      General: She is not in acute distress.    Appearance: She is well-developed.  HENT:     Head: Normocephalic and atraumatic.  Pulmonary:     Effort: Pulmonary effort is normal. No respiratory distress.  Abdominal:     Tenderness: There is no abdominal tenderness.  Genitourinary:    Comments: Dilation: 1 Effacement (%): 50 Cervical Position: Posterior Station: -3 Presentation: Vertex Exam by:: Heather Horn NP  Neurological:     Mental Status: She is alert.    NST:  Baseline: 140 bpm, Variability: Good {> 6 bpm), Accelerations: Reactive and Decelerations: Absent  MAU Course  Procedures Results for orders placed or performed during the hospital encounter of 11/21/20 (from the past 24 hour(s))  Urinalysis, Routine w reflex microscopic Urine, Clean Catch     Status: Abnormal   Collection Time: 11/21/20  9:17 PM  Result Value Ref Range   Color, Urine YELLOW YELLOW   APPearance CLEAR CLEAR   Specific Gravity, Urine 1.020 1.005 - 1.030   pH 6.0 5.0 - 8.0   Glucose, UA NEGATIVE NEGATIVE  mg/dL   Hgb urine dipstick NEGATIVE NEGATIVE   Bilirubin Urine NEGATIVE NEGATIVE   Ketones, ur NEGATIVE NEGATIVE mg/dL   Protein, ur NEGATIVE NEGATIVE mg/dL   Nitrite NEGATIVE NEGATIVE   Leukocytes,Ua TRACE (A) NEGATIVE  Urinalysis, Microscopic (reflex)     Status: None   Collection Time: 11/21/20  9:17 PM  Result Value Ref Range   RBC / HPF 0-5 0 - 5 RBC/hpf   WBC, UA 0-5 0 - 5 WBC/hpf   Bacteria, UA NONE SEEN NONE SEEN   Squamous Epithelial / LPF 0-5 0 - 5   Mucus PRESENT     MDM Patient presents with contractions. Cervix 1/50/-3. No regular contractions on the monitor and patient in no apparent distress. Cervix unchanged after 1+ hour of monitoring. Reactive fetal tracing.  Discussed braxton hicks contractions with patient, s/s of PTL, and reasons to return to MAU.    U/a with trace leuks. Pt with no urinary complaints. Will culture urine.    Assessment and Plan   1. Braxton Hick's contraction   2. [redacted] weeks gestation of pregnancy    -reviewed PTL precautions -f/u with [facility] -urine culture pending  Heather Riggs 11/21/2020, 9:59 PM

## 2020-11-21 NOTE — MAU Note (Signed)
Patient reports to MAU with abdomen nd pelvic that started this evening around 1830.  Pain is intermittent and feels like cramping.  Tried warm bath and tylenol at 1830.  Denies vaginal bleeding and leaking of fluid.  +FM.  Rates pain 9/10

## 2020-11-21 NOTE — Discharge Instructions (Signed)
Braxton Hicks Contractions Contractions of the uterus can occur throughout pregnancy, but they are not always a sign that you are in labor. You may have practice contractions called Braxton Hicks contractions. These false labor contractions are sometimes confused with true labor. What are Deberah Pelton contractions? Braxton Hicks contractions are tightening movements that occur in the muscles of the uterus before labor. Unlike true labor contractions, these contractions do not result in opening (dilation) and thinning of the cervix. Toward the end of pregnancy (32-34 weeks), Braxton Hicks contractions can happen more often and may become stronger. These contractions are sometimes difficult to tell apart from true labor because they can be very uncomfortable. You should not feel embarrassed if you go to the hospital with false labor. Sometimes, the only way to tell if you are in true labor is for your health care provider to look for changes in the cervix. The health care provider will do a physical exam and may monitor your contractions. If you are not in true labor, the exam should show that your cervix is not dilating and your water has not broken. If there are no other health problems associated with your pregnancy, it is completely safe for you to be sent home with false labor. You may continue to have Braxton Hicks contractions until you go into true labor. How to tell the difference between true labor and false labor True labor  Contractions last 30-70 seconds.  Contractions become very regular.  Discomfort is usually felt in the top of the uterus, and it spreads to the lower abdomen and low back.  Contractions do not go away with walking.  Contractions usually become more intense and increase in frequency.  The cervix dilates and gets thinner. False labor  Contractions are usually shorter and not as strong as true labor contractions.  Contractions are usually  irregular.  Contractions are often felt in the front of the lower abdomen and in the groin.  Contractions may go away when you walk around or change positions while lying down.  Contractions get weaker and are shorter-lasting as time goes on.  The cervix usually does not dilate or become thin. Follow these instructions at home:  Take over-the-counter and prescription medicines only as told by your health care provider.  Keep up with your usual exercises and follow other instructions from your health care provider.  Eat and drink lightly if you think you are going into labor.  If Braxton Hicks contractions are making you uncomfortable: ? Change your position from lying down or resting to walking, or change from walking to resting. ? Sit and rest in a tub of warm water. ? Drink enough fluid to keep your urine pale yellow. Dehydration may cause these contractions. ? Do slow and deep breathing several times an hour.  Keep all follow-up prenatal visits as told by your health care provider. This is important.   Contact a health care provider if:  You have a fever.  You have continuous pain in your abdomen. Get help right away if:  Your contractions become stronger, more regular, and closer together.  You have fluid leaking or gushing from your vagina.  You pass blood-tinged mucus (bloody show).  You have bleeding from your vagina.  You have low back pain that you never had before.  You feel your baby's head pushing down and causing pelvic pressure.  Your baby is not moving inside you as much as it used to. Summary  Contractions that occur before labor  are called Braxton Hicks contractions, false labor, or practice contractions.  Braxton Hicks contractions are usually shorter, weaker, farther apart, and less regular than true labor contractions. True labor contractions usually become progressively stronger and regular, and they become more frequent.  Manage discomfort from  [facility] contractions by changing position, resting in a warm bath, drinking plenty of water, or practicing deep breathing. This information is not intended to replace advice given to you by your health care provider. Make sure you discuss any questions you have with your health care provider. Document Revised: 09/06/2017 Document Reviewed: 02/07/2017 Elsevier Patient Education  2021 [employer]     Fetal Movement Counts Patient Name: ________________________________________________ Patient Due Date: ____________________  What is a fetal movement count? A fetal movement count is the number of times that you feel your baby move during a certain amount of time. This may also be called a fetal kick count. A fetal movement count is recommended for every pregnant woman. You may be asked to start counting fetal movements as early as week 28 of your pregnancy. Pay attention to when your baby is most active. You may notice your baby's sleep and wake cycles. You may also notice things that make your baby move more. You should do a fetal movement count:  When your baby is normally most active.  At the same time each day. A good time to count movements is while you are resting, after having something to eat and drink. How do I count fetal movements? 1. Find a quiet, comfortable area. Sit, or lie down on your side. 2. Write down the date, the start time and stop time, and the number of movements that you felt between those two times. Take this information with you to your health care visits. 3. Write down your start time when you feel the first movement. 4. Count kicks, flutters, swishes, rolls, and jabs. You should feel at least 10 movements. 5. You may stop counting after you have felt 10 movements, or if you have been counting for 2 hours. Write down the stop time. 6. If you do not feel 10 movements in 2 hours, contact your health care provider for further instructions. Your health care  provider may want to do additional tests to assess your baby's well-being. Contact a health care provider if:  You feel fewer than 10 movements in 2 hours.  Your baby is not moving like he or she usually does. Date: ____________ Start time: ____________ Stop time: ____________ Movements: ____________ Date: ____________ Start time: ____________ Stop time: ____________ Movements: ____________ Date: ____________ Start time: ____________ Stop time: ____________ Movements: ____________ Date: ____________ Start time: ____________ Stop time: ____________ Movements: ____________ Date: ____________ Start time: ____________ Stop time: ____________ Movements: ____________ Date: ____________ Start time: ____________ Stop time: ____________ Movements: ____________ Date: ____________ Start time: ____________ Stop time: ____________ Movements: ____________ Date: ____________ Start time: ____________ Stop time: ____________ Movements: ____________ Date: ____________ Start time: ____________ Stop time: ____________ Movements: ____________ This information is not intended to replace advice given to you by your health care provider. Make sure you discuss any questions you have with your health care provider. Document Revised: 05/14/2019 Document Reviewed: 05/14/2019 Elsevier Patient Education  2021 [employer].

## 2020-11-22 ENCOUNTER — Inpatient Hospital Stay (HOSPITAL_COMMUNITY)
Admission: AD | Admit: 2020-11-22 | Discharge: 2020-11-22 | Disposition: A | Discharge status: Home / Self Care | Payer: Medicaid Other | Attending: Obstetrics & Gynecology | Admitting: Obstetrics & Gynecology

## 2020-11-22 ENCOUNTER — Encounter (HOSPITAL_COMMUNITY): Payer: Self-pay | Admitting: Obstetrics & Gynecology

## 2020-11-22 ENCOUNTER — Inpatient Hospital Stay (HOSPITAL_BASED_OUTPATIENT_CLINIC_OR_DEPARTMENT_OTHER): Payer: Medicaid Other

## 2020-11-22 DIAGNOSIS — O26893 Other specified pregnancy related conditions, third trimester: Secondary | ICD-10-CM | POA: Diagnosis not present

## 2020-11-22 DIAGNOSIS — R102 Pelvic and perineal pain: Secondary | ICD-10-CM | POA: Diagnosis not present

## 2020-11-22 DIAGNOSIS — R1030 Lower abdominal pain, unspecified: Secondary | ICD-10-CM | POA: Diagnosis not present

## 2020-11-22 DIAGNOSIS — Z3689 Encounter for other specified antenatal screening: Secondary | ICD-10-CM | POA: Diagnosis not present

## 2020-11-22 DIAGNOSIS — Z3A34 34 weeks gestation of pregnancy: Secondary | ICD-10-CM

## 2020-11-22 DIAGNOSIS — O368130 Decreased fetal movements, third trimester, not applicable or unspecified: Secondary | ICD-10-CM | POA: Insufficient documentation

## 2020-11-22 DIAGNOSIS — Z79899 Other long term (current) drug therapy: Secondary | ICD-10-CM | POA: Diagnosis not present

## 2020-11-22 NOTE — MAU Note (Signed)
Patient was here last night for pelvic pain/pressure, was told to call her OB if it worsened.  Today started noticing DFM and still continues to have the pelvic/abdominal pain so they sent her in.  Last movement was 1-2 hours ago.  (FHR 150s).  Denies VB/LOF.  Some occasional contractions.

## 2020-11-22 NOTE — MAU Provider Note (Signed)
History     CSN: [id number]  Arrival date and time: 11/22/20 1215   Event Date/Time   First Provider Initiated Contact with Patient 11/22/20 1248      Chief Complaint  Patient presents with  . Decreased Fetal Movement   HPI Heather Riggs is a 30 y.o. [id number] at [redacted]w[redacted]d who presents to MAU with chief complaints of recurrent pelvic pain and absent fetal movement.   Lower Abdominal and Pelvic Pain This is a recurrent problem for which patient was evaluated in MAU last night. She states her pain continued today and she worried she was laboring. Abdominal pain score is 9/10. She is also experiencing discomfort in her pelvis. She describes it as "pressure", non-radiating. She has not taken medication or tried other treatments for this complaint.  Absent Fetal Movement Patient states she has not felt her baby move at all since she was discharged last night. She endorses regular PO intake and hydration. She denies LOF, vaginal bleeding, fever or recent illness.   Patient receives care with [facility].  OB History    Gravida  4   Para  2   Term  1   Preterm  1   AB  1   Living  2     SAB  1   IAB      Ectopic      Multiple      Live Births  2           Past Medical History:  Diagnosis Date  . Headache   . PIH (pregnancy induced hypertension)    With first pregnancy in 2018    Past Surgical History:  Procedure Laterality Date  . NO PAST SURGERIES      No family history on file.  Social History   Tobacco Use  . Smoking status: Never Smoker  . Smokeless tobacco: Never Used  Substance Use Topics  . Alcohol use: Never  . Drug use: Never    Allergies: No Known Allergies  Medications Prior to Admission  Medication Sig Dispense Refill Last Dose  . acetaminophen (TYLENOL) 325 MG tablet Take 650 mg by mouth.     . Prenatal Vit-Fe Fumarate-FA (MULTIVITAMIN-PRENATAL) 27-0.8 MG TABS tablet Take 1 tablet by mouth daily at 12 noon.       Review of Systems   Gastrointestinal: Positive for abdominal pain.  Genitourinary: Positive for pelvic pain. Negative for vaginal bleeding.  Musculoskeletal: Negative for back pain.  All other systems reviewed and are negative.  Physical Exam   There were no vitals taken for this visit.  Physical Exam Vitals and nursing note reviewed. Exam conducted with a chaperone present.  Constitutional:      Appearance: Normal appearance. She is obese.  Cardiovascular:     Rate and Rhythm: Normal rate.     Pulses: Normal pulses.     Heart sounds: Normal heart sounds.  Pulmonary:     Effort: Pulmonary effort is normal.     Breath sounds: Normal breath sounds.  Abdominal:     Tenderness: There is no right CVA tenderness or left CVA tenderness.     Comments: Gravid  Skin:    Capillary Refill: Capillary refill takes less than 2 seconds.  Neurological:     Mental Status: She is alert and oriented to person, place, and time.  Psychiatric:        Mood and Affect: Mood normal.        Behavior: Behavior normal.  Thought Content: Thought content normal.        Judgment: Judgment normal.     MAU Course  Procedures  --Fetal heart tones in triage. Audible fetal movement against monitors when applied in room. --Reactive tracing: baseline 150, mod var, +15 x 15 accels, no decels. Toco: quiet --Patient pushed NST button 9 times during 20 minutes of monitoring in MAU --Patient declines pain medicine in MAU  Orders Placed This Encounter  Procedures  . US MFM Fetal BPP Wo Non Stress  . Discharge patient   Patient Vitals for the past 24 hrs:  BP Pulse Resp SpO2  11/22/20 1350 139/68 (!) 101 15 100 %   Results for orders placed or performed during the hospital encounter of 11/21/20 (from the past 24 hour(s))  Urinalysis, Routine w reflex microscopic Urine, Clean Catch     Status: Abnormal   Collection Time: 11/21/20  9:17 PM  Result Value Ref Range   Color, Urine YELLOW YELLOW   APPearance CLEAR CLEAR    Specific Gravity, Urine 1.020 1.005 - 1.030   pH 6.0 5.0 - 8.0   Glucose, UA NEGATIVE NEGATIVE mg/dL   Hgb urine dipstick NEGATIVE NEGATIVE   Bilirubin Urine NEGATIVE NEGATIVE   Ketones, ur NEGATIVE NEGATIVE mg/dL   Protein, ur NEGATIVE NEGATIVE mg/dL   Nitrite NEGATIVE NEGATIVE   Leukocytes,Ua TRACE (A) NEGATIVE  Urinalysis, Microscopic (reflex)     Status: None   Collection Time: 11/21/20  9:17 PM  Result Value Ref Range   RBC / HPF 0-5 0 - 5 RBC/hpf   WBC, UA 0-5 0 - 5 WBC/hpf   Bacteria, UA NONE SEEN NONE SEEN   Squamous Epithelial / LPF 0-5 0 - 5   Mucus PRESENT     US MFM Fetal BPP Wo Non Stress  Result Date: 11/22/2020 ----------------------------------------------------------------------  OBSTETRICS REPORT                       (Signed Final 11/22/2020 04:24 pm) ---------------------------------------------------------------------- Patient Info  ID #:       [id number]                          D.O.B.:  Aug 02, 1991 (29 yrs)  Name:       Heather Riggs               Visit Date: 11/22/2020 01:08 pm ---------------------------------------------------------------------- Performed By  Attending:        Lin Landsmanorenthian Booker      [facility]      [facility]                    MD  Performed By:     Percell BostonHeather Waken          [facility] ---------------------------------------------------------------------- Orders  #  Description                           Code        Ordered By  1  US MFM FETAL BPP  WO NON               79892.11    [facility]     STRESS                                            Coletta Lockner ----------------------------------------------------------------------  #  Order #                     Accession #                Episode #  1  [id number]                   [id number]                 [id number] ---------------------------------------------------------------------- Indications  [redacted] weeks gestation  of pregnancy                Z3A.34  Decreased fetal movement                       O36.8190 ---------------------------------------------------------------------- Fetal Evaluation  Num Of Fetuses:         1  Fetal Heart Rate(bpm):  143  Cardiac Activity:       Observed  Presentation:           Cephalic  Amniotic Fluid  AFI FV:      Within normal limits  AFI Sum(cm)     %Tile       Largest Pocket(cm)  13.4            45          4.2  RUQ(cm)       RLQ(cm)       LUQ(cm)        LLQ(cm)  4.2           3.4           2.4            3.4 ---------------------------------------------------------------------- Biophysical Evaluation  Amniotic F.V:   Within normal limits       F. Tone:        Observed  F. Movement:    Observed                   Score:          8/8  F. Breathing:   Observed ---------------------------------------------------------------------- OB History  Gravidity:    4         Term:   1        Prem:   1        SAB:   1  TOP:          0       Ectopic:  0        Living: 2 ---------------------------------------------------------------------- Gestational Age  Clinical EDD:  34w 5d                                        EDD:   12/29/20  Best:          34w 5d     Det. By:  Clinical EDD             EDD:   12/29/20 ---------------------------------------------------------------------- Anatomy  Thoracic:              Appears normal         Bladder:                Appears normal  Stomach:               Appears normal, left                         sided ---------------------------------------------------------------------- Cervix Uterus Adnexa  Cervix  Not visualized (advanced GA >24wks) ---------------------------------------------------------------------- Impression  Antenatal testing performed given decreased fetal movement.  The biophysical profile was 8/8 with good fetal movement and  amniotic fluid volume. ---------------------------------------------------------------------- Recommendations  Follow up as  clinically indicated. ----------------------------------------------------------------------               Lin Landsman, MD Electronically Signed Final Report   11/22/2020 04:24 pm ----------------------------------------------------------------------  Assessment and Plan  --30 y.o. [id number] at [redacted]w[redacted]d  --Reactive tracing --BPP 8/8 --Patient endorsing fetal movement --Cervix 1cm, unchanged from 11/21/20 encounter --Discharge home in stable condition  Calvert Cantor, [place] 11/22/2020, 6:58 PM

## 2020-11-22 NOTE — Discharge Instructions (Signed)

## 2020-11-23 LAB — CULTURE, OB URINE: Culture: >=100000 — AB

## 2020-11-24 ENCOUNTER — Encounter (HOSPITAL_COMMUNITY): Payer: Self-pay | Admitting: Emergency Medicine

## 2020-11-30 ENCOUNTER — Other Ambulatory Visit: Payer: Self-pay

## 2020-11-30 ENCOUNTER — Inpatient Hospital Stay (HOSPITAL_COMMUNITY)
Admission: AD | Admit: 2020-11-30 | Discharge: 2020-11-30 | Disposition: A | Discharge status: Home / Self Care | Payer: Medicaid Other | Attending: Obstetrics and Gynecology | Admitting: Obstetrics and Gynecology

## 2020-11-30 ENCOUNTER — Encounter (HOSPITAL_COMMUNITY): Payer: Self-pay | Admitting: Obstetrics and Gynecology

## 2020-11-30 DIAGNOSIS — O26893 Other specified pregnancy related conditions, third trimester: Secondary | ICD-10-CM

## 2020-11-30 DIAGNOSIS — Z3A35 35 weeks gestation of pregnancy: Secondary | ICD-10-CM | POA: Diagnosis not present

## 2020-11-30 DIAGNOSIS — N898 Other specified noninflammatory disorders of vagina: Secondary | ICD-10-CM | POA: Diagnosis not present

## 2020-11-30 LAB — URINALYSIS, ROUTINE W REFLEX MICROSCOPIC
Bilirubin Urine: NEGATIVE
Glucose, UA: NEGATIVE mg/dL
Hgb urine dipstick: NEGATIVE
Ketones, ur: NEGATIVE mg/dL
Nitrite: NEGATIVE
Protein, ur: NEGATIVE mg/dL
Specific Gravity, Urine: 1.006 (ref 1.005–1.030)
pH: 6 (ref 5.0–8.0)

## 2020-11-30 LAB — WET PREP, GENITAL
Clue Cells Wet Prep HPF POC: NONE SEEN
Sperm: NONE SEEN
Trich, Wet Prep: NONE SEEN
Yeast Wet Prep HPF POC: NONE SEEN

## 2020-11-30 NOTE — MAU Provider Note (Signed)
History     CSN: [id number]  Arrival date and time: 11/30/20 1119   Event Date/Time   First Provider Initiated Contact with Patient 11/30/20 1233      Chief Complaint  Patient presents with  . Abdominal Pain  . Vaginal Bleeding    Vomiting    [name] is a 30 y.o. year old G63P2113 female at [redacted]w[redacted]d weeks gestation who presents to MAU reporting abdominal pain since yesterday around noon and some VB at 1000; now spotting only with wiping in the BR. She also complains of N/V and x 2 episodes today, light-headedness and thick vaginal d/c with a foul odor. She reports good (+) FM. She receives [facility] with [facility] and is scheduled for OB visit on 12/02/2020; records reviewed.   OB History    Gravida  5   Para  3   Term  2   Preterm  1   AB  1   Living  3     SAB  1   IAB  0   Ectopic  0   Multiple      Live Births  3           Past Medical History:  Diagnosis Date  . Headache   . PIH (pregnancy induced hypertension)    With first pregnancy in 2018  . Pregnancy induced hypertension    gHTN; no meds  . Vaginal Pap smear, abnormal     Past Surgical History:  Procedure Laterality Date  . BLADDER SURGERY     30 years old  . DILATION AND EVACUATION N/A 07/21/2018   Procedure: SUCTION DILATATION AND EVACUATION;  Surgeon: Essie Hart, MD;  Location: [facility];  Service: Gynecology;  Laterality: N/A;  . NO PAST SURGERIES      History reviewed. No pertinent family history.  Social History   Tobacco Use  . Smoking status: Never Smoker  . Smokeless tobacco: Never Used  Vaping Use  . Vaping Use: Never used  Substance Use Topics  . Alcohol use: Never  . Drug use: Never    Allergies:  Allergies  Allergen Reactions  . Penicillins Swelling and Other (See Comments)    Reaction:  Facial swelling  Has patient had a PCN reaction causing immediate rash, facial/tongue/throat swelling, SOB or lightheadedness with hypotension: Yes Has  patient had a PCN reaction causing severe rash involving mucus membranes or skin necrosis: No Has patient had a PCN reaction that required hospitalization No Has patient had a PCN reaction occurring within the last 10 years: Yes If all of the above answers are "NO", then may proceed with Cephalosporin use.    Medications Prior to Admission  Medication Sig Dispense Refill Last Dose  . acetaminophen (TYLENOL) 500 MG tablet Take 1 tablet (500 mg total) by mouth every 6 (six) hours as needed. 30 tablet 0 Past Week at Unknown time  . Prenatal Vit-Fe Fumarate-FA (MULTIVITAMIN-PRENATAL) 27-0.8 MG TABS tablet Take 1 tablet by mouth daily at 12 noon.   11/30/2020 at Unknown time  . acetaminophen (TYLENOL) 325 MG tablet Take 650 mg by mouth.     Marland Kitchen HYDROcodone-acetaminophen (NORCO) 5-325 MG tablet Take 1 tablet by mouth every 4 (four) hours as needed for severe pain. 8 tablet 0   . Prenatal Vit-Fe Fumarate-FA (PRENATAL VITAMINS) 28-0.8 MG TABS Take 1 tablet by mouth daily. 60 tablet 3     Review of Systems  Constitutional: Negative.   HENT: Negative.   Eyes: Negative.  Respiratory: Negative.   Cardiovascular: Negative.   Gastrointestinal: Negative.   Endocrine: Negative.   Genitourinary: Positive for vaginal discharge (thick, foul).  Musculoskeletal: Negative.   Skin: Negative.   Allergic/Immunologic: Negative.   Neurological: Negative.   Hematological: Negative.   Psychiatric/Behavioral: Negative.    Physical Exam   Blood pressure 130/71, pulse (!) 105, temperature 98.6 F (37 C), resp. rate 16, last menstrual period 03/13/2020, SpO2 97 %, unknown if currently breastfeeding.  Physical Exam Vitals and nursing note reviewed. Exam conducted with a chaperone present.  Constitutional:      Appearance: Normal appearance. She is obese.  Genitourinary:    Comments: Uterus: gravid, S=D, SE: cervix is smooth, pink, no lesions, scant amt of thick, mucoid, white vaginal d/c -- WP, GC/CT done, no  CMT or friability, no adnexal tenderness Dilation: 2 Effacement (%): 60 Cervical Position: Middle Station: -3 Presentation: Vertex Exam by:: Raelyn Mora CNM  Neurological:     Mental Status: She is alert and oriented to person, place, and time.  Psychiatric:        Mood and Affect: Mood normal.        Behavior: Behavior normal.        Thought Content: Thought content normal.        Judgment: Judgment normal.    REACTIVE NST - FHR: 140 bpm / moderate variability / accels present / decels absent / TOCO: UI noted  MAU Course  Procedures  MDM CCUA Wet Prep GC/CT -- Results pending   Results for orders placed or performed during the hospital encounter of 11/30/20 (from the past 24 hour(s))  Urinalysis, Routine w reflex microscopic Urine, Clean Catch     Status: Abnormal   Collection Time: 11/30/20 11:54 AM  Result Value Ref Range   Color, Urine YELLOW YELLOW   APPearance CLEAR CLEAR   Specific Gravity, Urine 1.006 1.005 - 1.030   pH 6.0 5.0 - 8.0   Glucose, UA NEGATIVE NEGATIVE mg/dL   Hgb urine dipstick NEGATIVE NEGATIVE   Bilirubin Urine NEGATIVE NEGATIVE   Ketones, ur NEGATIVE NEGATIVE mg/dL   Protein, ur NEGATIVE NEGATIVE mg/dL   Nitrite NEGATIVE NEGATIVE   Leukocytes,Ua LARGE (A) NEGATIVE   RBC / HPF 0-5 0 - 5 RBC/hpf   WBC, UA 0-5 0 - 5 WBC/hpf   Bacteria, UA RARE (A) NONE SEEN   Squamous Epithelial / LPF 0-5 0 - 5  Wet prep, genital     Status: Abnormal   Collection Time: 11/30/20  1:07 PM  Result Value Ref Range   Yeast Wet Prep HPF POC NONE SEEN NONE SEEN   Trich, Wet Prep NONE SEEN NONE SEEN   Clue Cells Wet Prep HPF POC NONE SEEN NONE SEEN   WBC, Wet Prep HPF POC MANY (A) NONE SEEN   Sperm NONE SEEN     Assessment and Plan  Vaginal discharge during pregnancy in third trimester - Plan: Discharge patient - Reassurance given there was no infection detected from vaginal discharge - Advised possibly discharge seems abnormal to her because of heightened  sense of smell during pregnancy - Keep scheduled appt with [facility] Friday 12/02/2020 - Patient verbalized an understanding of the plan of care and agrees.   Raelyn Mora, CNM 11/30/2020, 12:54 PM

## 2020-11-30 NOTE — MAU Note (Signed)
Patient stated that she started having abdominal pain since yesterday around noon.patient reports vaginal bleeding that started at 10 am. She noticed dark red blood in her underwear. Now she stated that she has light spotting when she wipes. Patient also had N/V at 8am with two episodes. Denies n/v at this time.  But reports  Light headedness and vaginal discharge that is thick with a foul odor. Pt report positive fetal movement, no contractions, no leakage of fluid. EFM commenced.

## 2020-12-01 LAB — GC/CHLAMYDIA PROBE AMP (~~LOC~~) NOT AT ARMC
Chlamydia: NEGATIVE
Comment: NEGATIVE
Comment: NORMAL
Neisseria Gonorrhea: NEGATIVE

## 2020-12-01 LAB — RPR: RPR Ser Ql: NONREACTIVE

## 2020-12-03 ENCOUNTER — Encounter (HOSPITAL_COMMUNITY): Payer: Self-pay | Admitting: Obstetrics and Gynecology

## 2020-12-03 ENCOUNTER — Other Ambulatory Visit: Payer: Self-pay

## 2020-12-03 ENCOUNTER — Inpatient Hospital Stay (HOSPITAL_COMMUNITY)
Admission: AD | Admit: 2020-12-03 | Discharge: 2020-12-03 | Disposition: A | Discharge status: Home / Self Care | Payer: Medicaid Other | Attending: Obstetrics and Gynecology | Admitting: Obstetrics and Gynecology

## 2020-12-03 DIAGNOSIS — O09293 Supervision of pregnancy with other poor reproductive or obstetric history, third trimester: Secondary | ICD-10-CM | POA: Diagnosis not present

## 2020-12-03 DIAGNOSIS — O368130 Decreased fetal movements, third trimester, not applicable or unspecified: Secondary | ICD-10-CM

## 2020-12-03 DIAGNOSIS — Z3A36 36 weeks gestation of pregnancy: Secondary | ICD-10-CM | POA: Diagnosis not present

## 2020-12-03 DIAGNOSIS — Z3689 Encounter for other specified antenatal screening: Secondary | ICD-10-CM

## 2020-12-03 DIAGNOSIS — O219 Vomiting of pregnancy, unspecified: Secondary | ICD-10-CM

## 2020-12-03 DIAGNOSIS — O212 Late vomiting of pregnancy: Secondary | ICD-10-CM

## 2020-12-03 DIAGNOSIS — O4703 False labor before 37 completed weeks of gestation, third trimester: Secondary | ICD-10-CM | POA: Diagnosis not present

## 2020-12-03 LAB — URINALYSIS, ROUTINE W REFLEX MICROSCOPIC
Bilirubin Urine: NEGATIVE
Glucose, UA: NEGATIVE mg/dL
Hgb urine dipstick: NEGATIVE
Ketones, ur: NEGATIVE mg/dL
Nitrite: NEGATIVE
Protein, ur: NEGATIVE mg/dL
Specific Gravity, Urine: 1.003 — ABNORMAL LOW (ref 1.005–1.030)
pH: 7 (ref 5.0–8.0)

## 2020-12-03 MED ORDER — ONDANSETRON 4 MG PO TBDP
4.0000 mg | ORAL_TABLET | Freq: Once | ORAL | Status: AC
  Administered 2020-12-03: 4 mg via ORAL
  Filled 2020-12-03: qty 1

## 2020-12-03 NOTE — MAU Provider Note (Signed)
History     CSN: [id number]  Arrival date and time: 12/03/20 0406   Event Date/Time   First Provider Initiated Contact with Patient 12/03/20 [phone]      Chief Complaint  Patient presents with  . Decreased Fetal Movement   Heather Riggs is a 30 y.o. G4P2 at [redacted]w[redacted]d who presents to MAU with complaints of DFM. Patient reports that she has not felt movement since around 2330 last night. Patient reports that once she got to MAU she started feeling fetal movement again. She reports nausea/emesis and "loose stools" since last night. She reports emesis 2x, last occurrence was 1900- she denies taking any medication for N/V at home prior to arrival. Patient reports having 4 loose stools, she denies being around anyone sick. She denies vaginal bleeding, LOF, discharge or contractions. Patient was last seen in the office yesterday and denies any complications during this pregnancy.    OB History    Gravida  4   Para  2   Term  2   Preterm  0   AB  1   Living  2     SAB  1   IAB  0   Ectopic  0   Multiple      Live Births  2           Past Medical History:  Diagnosis Date  . Headache   . PIH (pregnancy induced hypertension)    With first pregnancy in 2018  . Pregnancy induced hypertension    gHTN; no meds  . Vaginal Pap smear, abnormal     Past Surgical History:  Procedure Laterality Date  . BLADDER SURGERY     30 years old  . DILATION AND EVACUATION N/A 07/21/2018   Procedure: SUCTION DILATATION AND EVACUATION;  Surgeon: Essie Hart, MD;  Location: [facility];  Service: Gynecology;  Laterality: N/A;  . NO PAST SURGERIES      No family history on file.  Social History   Tobacco Use  . Smoking status: Never Smoker  . Smokeless tobacco: Never Used  Vaping Use  . Vaping Use: Never used  Substance Use Topics  . Alcohol use: Never  . Drug use: Never    Allergies: No Active Allergies  Medications Prior to Admission  Medication Sig  Dispense Refill Last Dose  . Prenatal Vit-Fe Fumarate-FA (MULTIVITAMIN-PRENATAL) 27-0.8 MG TABS tablet Take 1 tablet by mouth daily at 12 noon.   12/02/2020 at Unknown time  . acetaminophen (TYLENOL) 500 MG tablet Take 1 tablet (500 mg total) by mouth every 6 (six) hours as needed. 30 tablet 0     Review of Systems  Constitutional: Negative.   Respiratory: Negative.   Cardiovascular: Negative.   Gastrointestinal: Positive for diarrhea, nausea and vomiting. Negative for abdominal pain.  Genitourinary: Negative.   Musculoskeletal: Negative.   Neurological: Negative.   Psychiatric/Behavioral: Negative.    Physical Exam   Blood pressure 120/66, pulse 92, temperature 98.3 F (36.8 C), temperature source Oral, resp. rate 20, height 6' (1.829 m), weight 119.3 kg, last menstrual period 03/13/2020, unknown if currently breastfeeding.  Physical Exam Vitals and nursing note reviewed.  Cardiovascular:     Rate and Rhythm: Normal rate and regular rhythm.  Pulmonary:     Effort: Pulmonary effort is normal. No respiratory distress.     Breath sounds: Normal breath sounds. No wheezing.  Abdominal:     Palpations: Abdomen is soft. There is no mass.     Tenderness: There  is no abdominal tenderness. There is no guarding.     Comments: Gravid appropriate for gestational age, fetal movement palpated by CNM within 2 minutes   Musculoskeletal:     Right lower leg: No edema.     Left lower leg: No edema.  Skin:    General: Skin is warm and dry.  Neurological:     Mental Status: She is alert and oriented to person, place, and time.  Psychiatric:        Mood and Affect: Mood normal.        Behavior: Behavior normal.        Thought Content: Thought content normal.    Fetal monitoring:  130/moderate/+accels/ no decelerations  UI   MAU Course  Procedures  MDM Orders Placed This Encounter  Procedures  . Urinalysis, Routine w reflex microscopic Urine, Clean Catch   Meds ordered this encounter   Medications  . ondansetron (ZOFRAN-ODT) disintegrating tablet 4 mg   Labs reviewed:  Results for orders placed or performed during the hospital encounter of 12/03/20 (from the past 24 hour(s))  Urinalysis, Routine w reflex microscopic Urine, Clean Catch     Status: Abnormal   Collection Time: 12/03/20  4:40 AM  Result Value Ref Range   Color, Urine YELLOW YELLOW   APPearance CLEAR CLEAR   Specific Gravity, Urine 1.003 (L) 1.005 - 1.030   pH 7.0 5.0 - 8.0   Glucose, UA NEGATIVE NEGATIVE mg/dL   Hgb urine dipstick NEGATIVE NEGATIVE   Bilirubin Urine NEGATIVE NEGATIVE   Ketones, ur NEGATIVE NEGATIVE mg/dL   Protein, ur NEGATIVE NEGATIVE mg/dL   Nitrite NEGATIVE NEGATIVE   Leukocytes,Ua LARGE (A) NEGATIVE   RBC / HPF 0-5 0 - 5 RBC/hpf   WBC, UA 6-10 0 - 5 WBC/hpf   Bacteria, UA MANY (A) NONE SEEN   Squamous Epithelial / LPF 0-5 0 - 5   Mucus PRESENT    Patient not dehydrated - no ketones in urine Zofran ODT 4mg  given to patient  Fetal movement clicker given to patient @0547   Reassessment @ 0702 - patient reports fetal movement, has clicked 13 times over the past hour. Educated and discussed fetal kick counts with pregnancy. Discussed reasons to return to MAU. Follow up as scheduled in the office. Return to MAU as needed. Pt stable at time of discharge.   Assessment and Plan   1. NST (non-stress test) reactive   2. Nausea/vomiting in pregnancy   3. Decreased fetal movements in third trimester, single or unspecified fetus   4. [redacted] weeks gestation of pregnancy    Discharge home Follow up as scheduled in the office for prenatal care Return to MAU as needed for reasons discussed and/or emergencies  Fetal kick counts and hydration    Follow-up Information    [facility] Follow up.   Specialty: Obstetrics and Gynecology Contact information: [address] [place] [phone]             Allergies  as of 12/03/2020   No Active Allergies     Medication List    TAKE these medications   acetaminophen 500 MG tablet Commonly known as: TYLENOL Take 1 tablet (500 mg total) by mouth every 6 (six) hours as needed.   multivitamin-prenatal 27-0.8 MG Tabs tablet Take 1 tablet by mouth daily at 12 noon.       [phone] CNM 12/03/2020, 7:09 AM

## 2020-12-03 NOTE — MAU Note (Signed)
PT SAYS LAST TIME BABY MOVED WAS 2330. SAYS HAS HAD 4 LOOSE STOOLS - LAST TIME WAS 0300.   VOMITING  X2 - LAST TIME VOMITED WAS 7 PM LAST NIGHT .   PNC  WITH [facility]. -HAD AN APPOINTMENT YESTERDAY - ALL OK.   VE-  2 CM.  FELT SOME  MILD UC LAST NIGHT .

## 2020-12-04 ENCOUNTER — Inpatient Hospital Stay (HOSPITAL_COMMUNITY)
Admission: AD | Admit: 2020-12-04 | Discharge: 2020-12-04 | Disposition: A | Discharge status: Home / Self Care | Payer: Medicaid Other | Attending: Obstetrics and Gynecology | Admitting: Obstetrics and Gynecology

## 2020-12-04 ENCOUNTER — Other Ambulatory Visit: Payer: Self-pay

## 2020-12-04 DIAGNOSIS — O4703 False labor before 37 completed weeks of gestation, third trimester: Secondary | ICD-10-CM | POA: Diagnosis present

## 2020-12-04 DIAGNOSIS — Z3A36 36 weeks gestation of pregnancy: Secondary | ICD-10-CM | POA: Diagnosis not present

## 2020-12-04 DIAGNOSIS — O479 False labor, unspecified: Secondary | ICD-10-CM

## 2020-12-04 NOTE — MAU Provider Note (Signed)
[name] is a [id number] at [redacted]w[redacted]d seen in MAU for labor. RN labor check, not seen by provider.   SVE by RN Dilation: 1.5 Effacement (%): 50 Station: -3 Exam by:: Latricia Heft, RN   NST - FHR: 135 bpm / moderate variability / accels present / decels absent NST reactive / TOCO: irregular, mild to palpation  Plan:  D/C home with labor precautions Keep scheduled appt at [facility] Return to MAU with signs of labor or emergencies  Sharen Counter, CNM  12/04/2020 4:52 PM

## 2020-12-04 NOTE — MAU Note (Signed)
Patient reports some abdominal pain and pelvic pressure and some on and off contractions.  + FM.  Denies FM.

## 2020-12-04 NOTE — Discharge Instructions (Signed)
Reasons to return to MAU at [facility]:  Since you are preterm, return to MAU if:  1.  Contractions are 10 minutes apart or less and they becoming more uncomfortable or painful over time 2.  You have a large gush of fluid, or a trickle of fluid that will not stop and you have to wear a pad 3.  You have bleeding that is bright red, heavier than spotting--like menstrual bleeding (spotting can be normal in early labor or after a check of your cervix) 4.  You do not feel the baby moving like he/she normally does  

## 2020-12-05 ENCOUNTER — Other Ambulatory Visit: Payer: Self-pay

## 2020-12-05 ENCOUNTER — Inpatient Hospital Stay (HOSPITAL_COMMUNITY)
Admission: AD | Admit: 2020-12-05 | Discharge: 2020-12-05 | Disposition: A | Discharge status: Home / Self Care | Payer: Medicaid Other | Attending: Obstetrics & Gynecology | Admitting: Obstetrics & Gynecology

## 2020-12-05 ENCOUNTER — Encounter (HOSPITAL_COMMUNITY): Payer: Self-pay | Admitting: Obstetrics & Gynecology

## 2020-12-05 DIAGNOSIS — O26893 Other specified pregnancy related conditions, third trimester: Secondary | ICD-10-CM | POA: Insufficient documentation

## 2020-12-05 DIAGNOSIS — O368130 Decreased fetal movements, third trimester, not applicable or unspecified: Secondary | ICD-10-CM | POA: Diagnosis not present

## 2020-12-05 DIAGNOSIS — R102 Pelvic and perineal pain: Secondary | ICD-10-CM | POA: Diagnosis not present

## 2020-12-05 DIAGNOSIS — R1030 Lower abdominal pain, unspecified: Secondary | ICD-10-CM | POA: Diagnosis not present

## 2020-12-05 DIAGNOSIS — Z3A36 36 weeks gestation of pregnancy: Secondary | ICD-10-CM | POA: Insufficient documentation

## 2020-12-05 DIAGNOSIS — Z3689 Encounter for other specified antenatal screening: Secondary | ICD-10-CM

## 2020-12-05 LAB — URINALYSIS, ROUTINE W REFLEX MICROSCOPIC
Bilirubin Urine: NEGATIVE
Glucose, UA: NEGATIVE mg/dL
Hgb urine dipstick: NEGATIVE
Ketones, ur: NEGATIVE mg/dL
Leukocytes,Ua: NEGATIVE
Nitrite: NEGATIVE
Protein, ur: NEGATIVE mg/dL
Specific Gravity, Urine: 1.005 (ref 1.005–1.030)
pH: 6 (ref 5.0–8.0)

## 2020-12-05 MED ORDER — ACETAMINOPHEN 500 MG PO TABS
1000.0000 mg | ORAL_TABLET | Freq: Four times a day (QID) | ORAL | Status: DC | PRN
  Administered 2020-12-05: 1000 mg via ORAL
  Filled 2020-12-05: qty 2

## 2020-12-05 NOTE — MAU Provider Note (Signed)
History     CSN: [id number]  Arrival date and time: 12/05/20 2041   Event Date/Time   First Provider Initiated Contact with Patient 12/05/20 2121      Chief Complaint  Patient presents with  . Abdominal Pain  . Decreased Fetal Movement   29 y.o. [id number] @36 .4 wks presenting with LAP and decreased FM. LAP started yesterday. Describes as constant pain in middle of lower abdomen. Reports occ BH ctx. Denies urinary sx. Had BM today. Denies VB or LOF. Reports no FM since 1600 today although since arrival to MAU has felt 10+. Was seen in MAU yesterday for the pain and HA.   OB History    Gravida  4   Para  2   Term  2   Preterm  0   AB  1   Living  2     SAB  1   IAB  0   Ectopic  0   Multiple      Live Births  2           Past Medical History:  Diagnosis Date  . Headache   . PIH (pregnancy induced hypertension)    With first pregnancy in 2018  . Pregnancy induced hypertension    gHTN; no meds  . Vaginal Pap smear, abnormal     Past Surgical History:  Procedure Laterality Date  . BLADDER SURGERY     30 years old  . DILATION AND EVACUATION N/A 07/21/2018   Procedure: SUCTION DILATATION AND EVACUATION;  Surgeon: 07/23/2018, MD;  Location: [facility] [place];  Service: Gynecology;  Laterality: N/A;  . NO PAST SURGERIES      History reviewed. No pertinent family history.  Social History   Tobacco Use  . Smoking status: Never Smoker  . Smokeless tobacco: Never Used  Vaping Use  . Vaping Use: Never used  Substance Use Topics  . Alcohol use: Never  . Drug use: Never    Allergies: No Known Allergies  Medications Prior to Admission  Medication Sig Dispense Refill Last Dose  . acetaminophen (TYLENOL) 500 MG tablet Take 1 tablet (500 mg total) by mouth every 6 (six) hours as needed. 30 tablet 0   . Prenatal Vit-Fe Fumarate-FA (MULTIVITAMIN-PRENATAL) 27-0.8 MG TABS tablet Take 1 tablet by mouth daily at 12 noon.       Review of Systems   Gastrointestinal: Positive for abdominal pain. Negative for constipation, diarrhea, nausea and vomiting.  Genitourinary: Negative for dysuria, frequency, urgency, vaginal bleeding and vaginal discharge.   Physical Exam   Blood pressure 117/78, pulse (!) 104, temperature 98.3 F (36.8 C), resp. rate 18, height 6' (1.829 m), weight 121.1 kg, last menstrual period 03/13/2020, unknown if currently breastfeeding.  Physical Exam Vitals and nursing note reviewed. Exam conducted with a chaperone present.  Constitutional:      General: She is not in acute distress.    Appearance: Normal appearance.  HENT:     Head: Normocephalic and atraumatic.  Pulmonary:     Effort: Pulmonary effort is normal. No respiratory distress.  Abdominal:     Palpations: Abdomen is soft.     Tenderness: There is no abdominal tenderness.     Comments: gravid  Genitourinary:    Comments: VE: 2/60/-1 Musculoskeletal:        General: Normal range of motion.     Cervical back: Normal range of motion.  Skin:    General: Skin is warm and dry.  Neurological:  General: No focal deficit present.     Mental Status: She is alert and oriented to person, place, and time.  Psychiatric:        Mood and Affect: Mood normal.        Behavior: Behavior normal.   EFM: 140 bpm, mod variability, + accels, no decels Toco: none  Results for orders placed or performed during the hospital encounter of 12/05/20 (from the past 24 hour(s))  Urinalysis, Routine w reflex microscopic Urine, Clean Catch     Status: Abnormal   Collection Time: 12/05/20  8:54 PM  Result Value Ref Range   Color, Urine STRAW (A) YELLOW   APPearance CLEAR CLEAR   Specific Gravity, Urine 1.005 1.005 - 1.030   pH 6.0 5.0 - 8.0   Glucose, UA NEGATIVE NEGATIVE mg/dL   Hgb urine dipstick NEGATIVE NEGATIVE   Bilirubin Urine NEGATIVE NEGATIVE   Ketones, ur NEGATIVE NEGATIVE mg/dL   Protein, ur NEGATIVE NEGATIVE mg/dL   Nitrite NEGATIVE NEGATIVE    Leukocytes,Ua NEGATIVE NEGATIVE   MAU Course  Procedures Tylenol  MDM Prenatal records reviewed: [place] trait, depression, hx gHTN in previous pregnancy. Labs ordered and reviewed. Wet prep and GC culture neg on 2/23, not repeated today. Feeling good FM since arrival, marked >20 times on EFM, NST reactive. No signs of PTL, UTI, or acute abd process. Pain improved with Tylenol and likely physiologic to advancing pregnancy and fetal engagement. Stable for discharge home.   Assessment and Plan   1. [redacted] weeks gestation of pregnancy   2. NST (non-stress test) reactive   3. Pelvic pain affecting pregnancy in third trimester, antepartum    Discharge home Follow up at [facility] this week as scheduled PTL precautions FMCs  Allergies as of 12/05/2020   No Known Allergies     Medication List    TAKE these medications   acetaminophen 500 MG tablet Commonly known as: TYLENOL Take 1 tablet (500 mg total) by mouth every 6 (six) hours as needed.   multivitamin-prenatal 27-0.8 MG Tabs tablet Take 1 tablet by mouth daily at 12 noon.      Donette Larry, CNM 12/05/2020, 10:26 PM

## 2020-12-05 NOTE — MAU Note (Signed)
Pt reports she started feeling cramping in her lower stomach . Pain is constant now . Also reports decreased fetal movement since about 4:30.Denies any vag bleeding or leaking.

## 2020-12-05 NOTE — Discharge Instructions (Signed)
Fetal Movement Counts Patient Name: ________________________________________________ Patient Due Date: ____________________  What is a fetal movement count? A fetal movement count is the number of times that you feel your baby move during a certain amount of time. This may also be called a fetal kick count. A fetal movement count is recommended for every pregnant woman. You may be asked to start counting fetal movements as early as week 28 of your pregnancy. Pay attention to when your baby is most active. You may notice your baby's sleep and wake cycles. You may also notice things that make your baby move more. You should do a fetal movement count:  When your baby is normally most active.  At the same time each day. A good time to count movements is while you are resting, after having something to eat and drink. How do I count fetal movements? 1. Find a quiet, comfortable area. Sit, or lie down on your side. 2. Write down the date, the start time and stop time, and the number of movements that you felt between those two times. Take this information with you to your health care visits. 3. Write down your start time when you feel the first movement. 4. Count kicks, flutters, swishes, rolls, and jabs. You should feel at least 10 movements. 5. You may stop counting after you have felt 10 movements, or if you have been counting for 2 hours. Write down the stop time. 6. If you do not feel 10 movements in 2 hours, contact your health care provider for further instructions. Your health care provider may want to do additional tests to assess your baby's well-being. Contact a health care provider if:  You feel fewer than 10 movements in 2 hours.  Your baby is not moving like he or she usually does. Date: ____________ Start time: ____________ Stop time: ____________ Movements: ____________ Date: ____________ Start time: ____________ Stop time: ____________ Movements: ____________ Date: ____________  Start time: ____________ Stop time: ____________ Movements: ____________ Date: ____________ Start time: ____________ Stop time: ____________ Movements: ____________ Date: ____________ Start time: ____________ Stop time: ____________ Movements: ____________ Date: ____________ Start time: ____________ Stop time: ____________ Movements: ____________ Date: ____________ Start time: ____________ Stop time: ____________ Movements: ____________ Date: ____________ Start time: ____________ Stop time: ____________ Movements: ____________ Date: ____________ Start time: ____________ Stop time: ____________ Movements: ____________ This information is not intended to replace advice given to you by your health care provider. Make sure you discuss any questions you have with your health care provider. Document Revised: 05/14/2019 Document Reviewed: 05/14/2019 Elsevier Patient Education  2021 Elsevier Inc. Rosen's Emergency Medicine: Concepts and Clinical Practice (9th ed., pp. 2296- 2312). Elsevier.">    Braxton Hicks Contractions Contractions of the uterus can occur throughout pregnancy, but they are not always a sign that you are in labor. You may have practice contractions called Braxton Hicks contractions. These false labor contractions are sometimes confused with true labor. What are Braxton Hicks contractions? Braxton Hicks contractions are tightening movements that occur in the muscles of the uterus before labor. Unlike true labor contractions, these contractions do not result in opening (dilation) and thinning of the cervix. Toward the end of pregnancy (32-34 weeks), Braxton Hicks contractions can happen more often and may become stronger. These contractions are sometimes difficult to tell apart from true labor because they can be very uncomfortable. You should not feel embarrassed if you go to the hospital with false labor. Sometimes, the only way to tell if you are in true labor is   for your health care  provider to look for changes in the cervix. The health care provider will do a physical exam and may monitor your contractions. If you are not in true labor, the exam should show that your cervix is not dilating and your water has not broken. If there are no other health problems associated with your pregnancy, it is completely safe for you to be sent home with false labor. You may continue to have Braxton Hicks contractions until you go into true labor. How to tell the difference between true labor and false labor True labor  Contractions last 30-70 seconds.  Contractions become very regular.  Discomfort is usually felt in the top of the uterus, and it spreads to the lower abdomen and low back.  Contractions do not go away with walking.  Contractions usually become more intense and increase in frequency.  The cervix dilates and gets thinner. False labor  Contractions are usually shorter and not as strong as true labor contractions.  Contractions are usually irregular.  Contractions are often felt in the front of the lower abdomen and in the groin.  Contractions may go away when you walk around or change positions while lying down.  Contractions get weaker and are shorter-lasting as time goes on.  The cervix usually does not dilate or become thin. Follow these instructions at home:  Take over-the-counter and prescription medicines only as told by your health care provider.  Keep up with your usual exercises and follow other instructions from your health care provider.  Eat and drink lightly if you think you are going into labor.  If Braxton Hicks contractions are making you uncomfortable: ? Change your position from lying down or resting to walking, or change from walking to resting. ? Sit and rest in a tub of warm water. ? Drink enough fluid to keep your urine pale yellow. Dehydration may cause these contractions. ? Do slow and deep breathing several times an hour.  Keep  all follow-up prenatal visits as told by your health care provider. This is important.   Contact a health care provider if:  You have a fever.  You have continuous pain in your abdomen. Get help right away if:  Your contractions become stronger, more regular, and closer together.  You have fluid leaking or gushing from your vagina.  You pass blood-tinged mucus (bloody show).  You have bleeding from your vagina.  You have low back pain that you never had before.  You feel your baby's head pushing down and causing pelvic pressure.  Your baby is not moving inside you as much as it used to. Summary  Contractions that occur before labor are called Braxton Hicks contractions, false labor, or practice contractions.  Braxton Hicks contractions are usually shorter, weaker, farther apart, and less regular than true labor contractions. True labor contractions usually become progressively stronger and regular, and they become more frequent.  Manage discomfort from Braxton Hicks contractions by changing position, resting in a warm bath, drinking plenty of water, or practicing deep breathing. This information is not intended to replace advice given to you by your health care provider. Make sure you discuss any questions you have with your health care provider. Document Revised: 09/06/2017 Document Reviewed: 02/07/2017 Elsevier Patient Education  2021 Elsevier Inc.  

## 2020-12-07 LAB — CULTURE, OB URINE

## 2020-12-09 ENCOUNTER — Encounter (HOSPITAL_COMMUNITY): Payer: Self-pay | Admitting: Obstetrics and Gynecology

## 2020-12-09 ENCOUNTER — Other Ambulatory Visit: Payer: Self-pay

## 2020-12-09 ENCOUNTER — Inpatient Hospital Stay (HOSPITAL_COMMUNITY)
Admission: AD | Admit: 2020-12-09 | Discharge: 2020-12-10 | Disposition: A | Discharge status: Home / Self Care | Payer: Medicaid Other | Attending: Obstetrics and Gynecology | Admitting: Obstetrics and Gynecology

## 2020-12-09 DIAGNOSIS — Z3A37 37 weeks gestation of pregnancy: Secondary | ICD-10-CM | POA: Insufficient documentation

## 2020-12-09 DIAGNOSIS — O471 False labor at or after 37 completed weeks of gestation: Secondary | ICD-10-CM | POA: Insufficient documentation

## 2020-12-09 NOTE — MAU Note (Signed)
Pt reports contractions since 5 pm, denies bleeding or ROM. Reports good fetal movement.

## 2020-12-10 ENCOUNTER — Encounter (HOSPITAL_COMMUNITY): Payer: Self-pay | Admitting: Obstetrics and Gynecology

## 2020-12-10 ENCOUNTER — Inpatient Hospital Stay (HOSPITAL_COMMUNITY)
Admission: AD | Admit: 2020-12-10 | Discharge: 2020-12-10 | Disposition: A | Discharge status: Home / Self Care | Payer: Medicaid Other | Source: Home / Self Care | Attending: Obstetrics and Gynecology | Admitting: Obstetrics and Gynecology

## 2020-12-10 DIAGNOSIS — Z3A37 37 weeks gestation of pregnancy: Secondary | ICD-10-CM

## 2020-12-10 DIAGNOSIS — O479 False labor, unspecified: Secondary | ICD-10-CM

## 2020-12-10 DIAGNOSIS — O471 False labor at or after 37 completed weeks of gestation: Secondary | ICD-10-CM | POA: Insufficient documentation

## 2020-12-10 NOTE — Discharge Instructions (Signed)

## 2020-12-10 NOTE — MAU Note (Signed)
Heather Riggs is a 30 y.o. at [redacted]w[redacted]d here in MAU reporting: states contractions restarted this AM and since around 64 they have been about every 2-5 minutes. Increased pressure. No bleeding. No LOF. Some DFM.  Onset of complaint: ongoing  Pain score: 8/10  Vitals:   12/10/20 1323  BP: 126/69  Pulse: 87  Resp: 16  Temp: 98 F (36.7 C)  SpO2: 99%     FHT:139  Lab orders placed from triage: none

## 2020-12-10 NOTE — MAU Provider Note (Signed)
Ms. Jaunita Mikels is a [id number] at [redacted]w[redacted]d seen in MAU for labor. RN labor check, not seen by provider. SVE by RN Dilation: 3.5 Effacement (%): 70 Cervical Position: Middle Station: -2 Presentation: Vertex Exam by:: K Torphy, RN  After 3 hours of observation  NST - FHR: 140 bpm / moderate variability / accels present / decels absent / TOCO: irregular UCs  Plan:  D/C home with labor precautions Keep scheduled appt with [facility] next week  Raelyn Mora, CNM  12/10/2020 2:17 AM

## 2020-12-12 ENCOUNTER — Inpatient Hospital Stay (HOSPITAL_COMMUNITY)
Admission: AD | Admit: 2020-12-12 | Discharge: 2020-12-12 | Disposition: A | Discharge status: Home / Self Care | Payer: Medicaid Other | Attending: Obstetrics & Gynecology | Admitting: Obstetrics & Gynecology

## 2020-12-12 ENCOUNTER — Other Ambulatory Visit: Payer: Self-pay

## 2020-12-12 ENCOUNTER — Encounter (HOSPITAL_COMMUNITY): Payer: Self-pay | Admitting: Obstetrics & Gynecology

## 2020-12-12 DIAGNOSIS — O479 False labor, unspecified: Secondary | ICD-10-CM

## 2020-12-12 DIAGNOSIS — Z3A38 38 weeks gestation of pregnancy: Secondary | ICD-10-CM | POA: Diagnosis not present

## 2020-12-12 DIAGNOSIS — O471 False labor at or after 37 completed weeks of gestation: Secondary | ICD-10-CM | POA: Diagnosis not present

## 2020-12-12 NOTE — MAU Note (Signed)
Presents with c/o ctxs that 2-5 minutes apart since 1600 this afternoon.  Also reports having diarrhea stools that began this afternoon @ 1400, reports has 4 loose watery stools.  Denies VB or LOF.  Endorses +FM.

## 2020-12-12 NOTE — Discharge Instructions (Signed)

## 2020-12-19 ENCOUNTER — Inpatient Hospital Stay (HOSPITAL_BASED_OUTPATIENT_CLINIC_OR_DEPARTMENT_OTHER): Payer: Medicaid Other

## 2020-12-19 ENCOUNTER — Observation Stay (HOSPITAL_COMMUNITY)
Admission: AD | Admit: 2020-12-19 | Discharge: 2020-12-20 | Disposition: A | Discharge status: Home / Self Care | Payer: Medicaid Other | Attending: Obstetrics & Gynecology | Admitting: Obstetrics & Gynecology

## 2020-12-19 ENCOUNTER — Encounter (HOSPITAL_COMMUNITY): Payer: Self-pay | Admitting: Obstetrics & Gynecology

## 2020-12-19 ENCOUNTER — Other Ambulatory Visit: Payer: Self-pay

## 2020-12-19 DIAGNOSIS — R109 Unspecified abdominal pain: Secondary | ICD-10-CM | POA: Diagnosis present

## 2020-12-19 DIAGNOSIS — Y92410 Unspecified street and highway as the place of occurrence of the external cause: Secondary | ICD-10-CM | POA: Insufficient documentation

## 2020-12-19 DIAGNOSIS — Z20822 Contact with and (suspected) exposure to covid-19: Secondary | ICD-10-CM | POA: Diagnosis not present

## 2020-12-19 DIAGNOSIS — O9A213 Injury, poisoning and certain other consequences of external causes complicating pregnancy, third trimester: Principal | ICD-10-CM | POA: Insufficient documentation

## 2020-12-19 DIAGNOSIS — W2209XA Striking against other stationary object, initial encounter: Secondary | ICD-10-CM | POA: Insufficient documentation

## 2020-12-19 LAB — URINALYSIS, ROUTINE W REFLEX MICROSCOPIC
Bilirubin Urine: NEGATIVE
Glucose, UA: NEGATIVE mg/dL
Hgb urine dipstick: NEGATIVE
Ketones, ur: NEGATIVE mg/dL
Leukocytes,Ua: NEGATIVE
Nitrite: NEGATIVE
Protein, ur: NEGATIVE mg/dL
Specific Gravity, Urine: 1.013 (ref 1.005–1.030)
pH: 5 (ref 5.0–8.0)
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
## 2020-12-19 NOTE — MAU Note (Signed)
Pt returned from [place] bathroom to urinate
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
## 2020-12-20 DIAGNOSIS — O99891 Other specified diseases and conditions complicating pregnancy: Secondary | ICD-10-CM | POA: Diagnosis not present

## 2020-12-20 DIAGNOSIS — Z3A38 38 weeks gestation of pregnancy: Secondary | ICD-10-CM

## 2020-12-20 DIAGNOSIS — O9A213 Injury, poisoning and certain other consequences of external causes complicating pregnancy, third trimester: Secondary | ICD-10-CM | POA: Diagnosis not present

## 2020-12-20 DIAGNOSIS — R109 Unspecified abdominal pain: Secondary | ICD-10-CM | POA: Diagnosis present

## 2020-12-20 DIAGNOSIS — T1490XA Injury, unspecified, initial encounter: Secondary | ICD-10-CM

## 2020-12-20 LAB — RESP PANEL BY RT-PCR (FLU A&B, COVID) ARPGX2
Influenza A by PCR: NEGATIVE
Influenza B by PCR: NEGATIVE
SARS Coronavirus 2 by RT PCR: NEGATIVE

## 2020-12-20 MED ORDER — DOCUSATE SODIUM 100 MG PO CAPS: 100.0000 mg | ORAL_CAPSULE | Freq: Every day | ORAL | Status: DC

## 2020-12-20 MED ORDER — ACETAMINOPHEN 325 MG PO TABS: 650.0000 mg | ORAL_TABLET | ORAL | Status: DC | PRN

## 2020-12-20 MED ORDER — ZOLPIDEM TARTRATE 5 MG PO TABS: 5.0000 mg | ORAL_TABLET | Freq: Every evening | ORAL | Status: DC | PRN

## 2020-12-20 MED ORDER — CALCIUM CARBONATE ANTACID 500 MG PO CHEW: 2.0000 | CHEWABLE_TABLET | ORAL | Status: DC | PRN

## 2020-12-20 MED ORDER — PRENATAL MULTIVITAMIN CH: 1.0000 | ORAL_TABLET | Freq: Every day | ORAL | Status: DC

## 2020-12-20 NOTE — Plan of Care (Signed)
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
## 2020-12-21 ENCOUNTER — Other Ambulatory Visit: Payer: Self-pay | Admitting: Obstetrics & Gynecology

## 2020-12-21 ENCOUNTER — Telehealth (HOSPITAL_COMMUNITY): Payer: Self-pay | Admitting: *Deleted

## 2020-12-21 ENCOUNTER — Encounter (HOSPITAL_COMMUNITY): Payer: Self-pay | Admitting: *Deleted

## 2020-12-21 NOTE — Telephone Encounter (Signed)
Preadmission screen  

## 2020-12-22 ENCOUNTER — Other Ambulatory Visit (HOSPITAL_COMMUNITY)
Admission: RE | Admit: 2020-12-22 | Discharge: 2020-12-22 | Disposition: A | Discharge status: Home / Self Care | Payer: Medicaid Other | Source: Ambulatory Visit | Attending: Obstetrics & Gynecology | Admitting: Obstetrics & Gynecology

## 2020-12-22 DIAGNOSIS — Z01812 Encounter for preprocedural laboratory examination: Secondary | ICD-10-CM | POA: Insufficient documentation

## 2020-12-22 DIAGNOSIS — Z20822 Contact with and (suspected) exposure to covid-19: Secondary | ICD-10-CM | POA: Insufficient documentation

## 2020-12-22 LAB — SARS CORONAVIRUS 2 (TAT 6-24 HRS): SARS Coronavirus 2: NEGATIVE
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
## 2020-12-23 ENCOUNTER — Other Ambulatory Visit: Payer: Self-pay

## 2020-12-23 ENCOUNTER — Inpatient Hospital Stay (HOSPITAL_COMMUNITY): Payer: Medicaid Other

## 2020-12-23 ENCOUNTER — Encounter (HOSPITAL_COMMUNITY): Payer: Self-pay | Admitting: Obstetrics & Gynecology

## 2020-12-23 ENCOUNTER — Inpatient Hospital Stay (HOSPITAL_COMMUNITY): Payer: Medicaid Other | Admitting: Anesthesiology

## 2020-12-23 ENCOUNTER — Inpatient Hospital Stay (HOSPITAL_COMMUNITY)
Admission: AD | Admit: 2020-12-23 | Discharge: 2020-12-25 | DRG: 797 | Disposition: A | Discharge status: Home / Self Care | Payer: Medicaid Other | Attending: Obstetrics & Gynecology | Admitting: Obstetrics & Gynecology

## 2020-12-23 DIAGNOSIS — F32A Depression, unspecified: Secondary | ICD-10-CM | POA: Diagnosis present

## 2020-12-23 DIAGNOSIS — O99344 Other mental disorders complicating childbirth: Principal | ICD-10-CM | POA: Diagnosis present

## 2020-12-23 DIAGNOSIS — Z3A39 39 weeks gestation of pregnancy: Secondary | ICD-10-CM | POA: Diagnosis not present

## 2020-12-23 DIAGNOSIS — D62 Acute posthemorrhagic anemia: Secondary | ICD-10-CM | POA: Diagnosis not present

## 2020-12-23 DIAGNOSIS — Z302 Encounter for sterilization: Secondary | ICD-10-CM

## 2020-12-23 DIAGNOSIS — O26893 Other specified pregnancy related conditions, third trimester: Secondary | ICD-10-CM | POA: Diagnosis present

## 2020-12-23 DIAGNOSIS — Z20822 Contact with and (suspected) exposure to covid-19: Secondary | ICD-10-CM | POA: Diagnosis present

## 2020-12-23 DIAGNOSIS — Z9851 Tubal ligation status: Secondary | ICD-10-CM

## 2020-12-23 DIAGNOSIS — D573 Sickle-cell trait: Secondary | ICD-10-CM | POA: Diagnosis present

## 2020-12-23 DIAGNOSIS — O9081 Anemia of the puerperium: Secondary | ICD-10-CM | POA: Diagnosis not present

## 2020-12-23 DIAGNOSIS — Z3490 Encounter for supervision of normal pregnancy, unspecified, unspecified trimester: Secondary | ICD-10-CM | POA: Diagnosis present

## 2020-12-23 LAB — TYPE AND SCREEN
ABO/RH(D): O POS
Antibody Screen: NEGATIVE

## 2020-12-23 LAB — CBC
HCT: 30 % — ABNORMAL LOW (ref 36.0–46.0)
Hemoglobin: 9.8 g/dL — ABNORMAL LOW (ref 12.0–15.0)
MCH: 24.8 pg — ABNORMAL LOW (ref 26.0–34.0)
MCHC: 32.7 g/dL (ref 30.0–36.0)
MCV: 75.9 fL — ABNORMAL LOW (ref 80.0–100.0)
Platelets: 284 10*3/uL (ref 150–400)
RBC: 3.95 MIL/uL (ref 3.87–5.11)
RDW: 14.9 % (ref 11.5–15.5)
WBC: 11.3 10*3/uL — ABNORMAL HIGH (ref 4.0–10.5)
nRBC: 0 % (ref 0.0–0.2)

## 2020-12-23 MED ORDER — TERBUTALINE SULFATE 1 MG/ML IJ SOLN: 0.2500 mg | Freq: Once | INTRAMUSCULAR | Status: DC | PRN

## 2020-12-23 MED ORDER — PHENYLEPHRINE 40 MCG/ML (10ML) SYRINGE FOR IV PUSH (FOR BLOOD PRESSURE SUPPORT): 80.0000 ug | PREFILLED_SYRINGE | INTRAVENOUS | Status: DC | PRN

## 2020-12-23 MED ORDER — FENTANYL-BUPIVACAINE-NACL 0.5-0.125-0.9 MG/250ML-% EP SOLN
12.0000 mL/h | EPIDURAL | Status: DC | PRN
  Administered 2020-12-23: 12 mL/h via EPIDURAL
  Filled 2020-12-23: qty 250

## 2020-12-23 MED ORDER — PRENATAL MULTIVITAMIN CH
1.0000 | ORAL_TABLET | Freq: Every day | ORAL | Status: DC
  Administered 2020-12-24 – 2020-12-25 (×2): 1 via ORAL
  Filled 2020-12-23 (×2): qty 1

## 2020-12-23 MED ORDER — SENNOSIDES-DOCUSATE SODIUM 8.6-50 MG PO TABS
2.0000 | ORAL_TABLET | Freq: Every day | ORAL | Status: DC
  Administered 2020-12-25: 2 via ORAL
  Filled 2020-12-23: qty 2

## 2020-12-23 MED ORDER — IBUPROFEN 600 MG PO TABS
600.0000 mg | ORAL_TABLET | Freq: Four times a day (QID) | ORAL | Status: DC
  Administered 2020-12-24 – 2020-12-25 (×6): 600 mg via ORAL
  Filled 2020-12-23 (×7): qty 1

## 2020-12-23 MED ORDER — LACTATED RINGERS IV SOLN: INTRAVENOUS | Status: DC

## 2020-12-23 MED ORDER — SOD CITRATE-CITRIC ACID 500-334 MG/5ML PO SOLN: 30.0000 mL | ORAL | Status: DC | PRN

## 2020-12-23 MED ORDER — ACETAMINOPHEN 325 MG PO TABS: 650.0000 mg | ORAL_TABLET | ORAL | Status: DC | PRN

## 2020-12-23 MED ORDER — EPHEDRINE 5 MG/ML INJ: 10.0000 mg | INTRAVENOUS | Status: DC | PRN

## 2020-12-23 MED ORDER — WITCH HAZEL-GLYCERIN EX PADS: 1.0000 "application " | MEDICATED_PAD | CUTANEOUS | Status: DC | PRN

## 2020-12-23 MED ORDER — SERTRALINE HCL 50 MG PO TABS
50.0000 mg | ORAL_TABLET | Freq: Every day | ORAL | Status: DC
  Administered 2020-12-25: 50 mg via ORAL
  Filled 2020-12-23: qty 1

## 2020-12-23 MED ORDER — DIBUCAINE (PERIANAL) 1 % EX OINT: 1.0000 "application " | TOPICAL_OINTMENT | CUTANEOUS | Status: DC | PRN

## 2020-12-23 MED ORDER — OXYTOCIN-SODIUM CHLORIDE 30-0.9 UT/500ML-% IV SOLN
2.5000 [IU]/h | INTRAVENOUS | Status: DC
  Filled 2020-12-23: qty 500

## 2020-12-23 MED ORDER — TETANUS-DIPHTH-ACELL PERTUSSIS 5-2.5-18.5 LF-MCG/0.5 IM SUSY: 0.5000 mL | PREFILLED_SYRINGE | Freq: Once | INTRAMUSCULAR | Status: DC

## 2020-12-23 MED ORDER — LIDOCAINE HCL (PF) 1 % IJ SOLN: 30.0000 mL | INTRAMUSCULAR | Status: DC | PRN

## 2020-12-23 MED ORDER — BENZOCAINE-MENTHOL 20-0.5 % EX AERO: 1.0000 "application " | INHALATION_SPRAY | CUTANEOUS | Status: DC | PRN

## 2020-12-23 MED ORDER — OXYTOCIN-SODIUM CHLORIDE 30-0.9 UT/500ML-% IV SOLN
1.0000 m[IU]/min | INTRAVENOUS | Status: DC
  Administered 2020-12-23: 2 m[IU]/min via INTRAVENOUS

## 2020-12-23 MED ORDER — ONDANSETRON HCL 4 MG PO TABS: 4.0000 mg | ORAL_TABLET | ORAL | Status: DC | PRN

## 2020-12-23 MED ORDER — LACTATED RINGERS IV SOLN
500.0000 mL | Freq: Once | INTRAVENOUS | Status: AC
  Administered 2020-12-23: 500 mL via INTRAVENOUS

## 2020-12-23 MED ORDER — COCONUT OIL OIL
1.0000 | TOPICAL_OIL | Status: DC | PRN
Start: 2020-12-23 — End: 2020-12-25

## 2020-12-23 MED ORDER — DIPHENHYDRAMINE HCL 50 MG/ML IJ SOLN: 12.5000 mg | INTRAMUSCULAR | Status: DC | PRN

## 2020-12-23 MED ORDER — ONDANSETRON HCL 4 MG/2ML IJ SOLN: 4.0000 mg | INTRAMUSCULAR | Status: DC | PRN

## 2020-12-23 MED ORDER — ONDANSETRON HCL 4 MG/2ML IJ SOLN: 4.0000 mg | Freq: Four times a day (QID) | INTRAMUSCULAR | Status: DC | PRN

## 2020-12-23 MED ORDER — SIMETHICONE 80 MG PO CHEW: 80.0000 mg | CHEWABLE_TABLET | ORAL | Status: DC | PRN

## 2020-12-23 MED ORDER — ACETAMINOPHEN 325 MG PO TABS
650.0000 mg | ORAL_TABLET | ORAL | Status: DC | PRN
  Administered 2020-12-25: 650 mg via ORAL
  Filled 2020-12-23: qty 2

## 2020-12-23 MED ORDER — ZOLPIDEM TARTRATE 5 MG PO TABS: 5.0000 mg | ORAL_TABLET | Freq: Every evening | ORAL | Status: DC | PRN

## 2020-12-23 MED ORDER — OXYTOCIN 10 UNIT/ML IJ SOLN
INTRAMUSCULAR | Status: AC
  Administered 2020-12-23: 10 [IU]
  Filled 2020-12-23: qty 1

## 2020-12-23 MED ORDER — LIDOCAINE-EPINEPHRINE (PF) 2 %-1:200000 IJ SOLN
INTRAMUSCULAR | Status: DC | PRN
  Administered 2020-12-23: 4 mL via EPIDURAL

## 2020-12-23 MED ORDER — OXYTOCIN BOLUS FROM INFUSION: 333.0000 mL | Freq: Once | INTRAVENOUS | Status: DC

## 2020-12-23 MED ORDER — LACTATED RINGERS IV SOLN: 500.0000 mL | INTRAVENOUS | Status: DC | PRN

## 2020-12-23 MED ORDER — FENTANYL CITRATE (PF) 100 MCG/2ML IJ SOLN
50.0000 ug | INTRAMUSCULAR | Status: DC | PRN
  Administered 2020-12-23: 100 ug via INTRAVENOUS
  Filled 2020-12-23: qty 2

## 2020-12-23 MED ORDER — DIPHENHYDRAMINE HCL 25 MG PO CAPS
25.0000 mg | ORAL_CAPSULE | Freq: Four times a day (QID) | ORAL | Status: DC | PRN
  Administered 2020-12-24: 25 mg via ORAL
  Filled 2020-12-23: qty 1

## 2020-12-23 NOTE — Anesthesia Preprocedure Evaluation (Signed)
Anesthesia Evaluation  Patient identified by MRN, date of birth, ID band Patient awake    Reviewed: Allergy & Precautions, NPO status , Patient's Chart, lab work & pertinent test results  Airway Mallampati: III  TM Distance: >3 FB Neck ROM: Full    Dental no notable dental hx.    Pulmonary neg pulmonary ROS,    Pulmonary exam normal breath sounds clear to auscultation       Cardiovascular hypertension (gHTN), Normal cardiovascular exam Rhythm:Regular Rate:Normal     Neuro/Psych  Headaches, PSYCHIATRIC DISORDERS Depression    GI/Hepatic negative GI ROS, Neg liver ROS,   Endo/Other  negative endocrine ROS  Renal/GU negative Renal ROS  negative genitourinary   Musculoskeletal negative musculoskeletal ROS (+)   Abdominal   Peds  Hematology negative hematology ROS (+)   Anesthesia Other Findings IOL at term  Reproductive/Obstetrics (+) Pregnancy                             Anesthesia Physical Anesthesia Plan  ASA: III  Anesthesia Plan: Epidural   Post-op Pain Management:    Induction:   PONV Risk Score and Plan: Treatment may vary due to age or medical condition  Airway Management Planned: Natural Airway  Additional Equipment:   Intra-op Plan:   Post-operative Plan:   Informed Consent: I have reviewed the patients History and Physical, chart, labs and discussed the procedure including the risks, benefits and alternatives for the proposed anesthesia with the patient or authorized representative who has indicated his/her understanding and acceptance.       Plan Discussed with: Anesthesiologist  Anesthesia Plan Comments: (Patient identified. Risks, benefits, options discussed with patient including but not limited to bleeding, infection, nerve damage, paralysis, failed block, incomplete pain control, headache, blood pressure changes, nausea, vomiting, reactions to medication,  itching, and post partum back pain. Confirmed with bedside nurse the patient's most recent platelet count. Confirmed with the patient that they are not taking any anticoagulation, have any bleeding history or any family history of bleeding disorders. Patient expressed understanding and wishes to proceed. All questions were answered. )        Anesthesia Quick Evaluation

## 2020-12-23 NOTE — Anesthesia Procedure Notes (Signed)
Epidural Patient location during procedure: OB Start time: 12/23/2020 8:55 PM End time: 12/23/2020 9:05 PM  Staffing Anesthesiologist: Elmer Picker, MD Performed: anesthesiologist   Preanesthetic Checklist Completed: patient identified, IV checked, risks and benefits discussed, monitors and equipment checked, pre-op evaluation and timeout performed  Epidural Patient position: sitting Prep: DuraPrep and site prepped and draped Patient monitoring: continuous pulse ox, blood pressure, heart rate and cardiac monitor Approach: midline Location: L3-L4 Injection technique: LOR air  Needle:  Needle type: Tuohy  Needle gauge: 17 G Needle length: 9 cm Needle insertion depth: 5 cm Catheter type: closed end flexible Catheter size: 19 Gauge Catheter at skin depth: 10 cm Test dose: negative  Assessment Sensory level: T8 Events: blood not aspirated, injection not painful, no injection resistance, no paresthesia and negative IV test  Additional Notes Patient identified. Risks/Benefits/Options discussed with patient including but not limited to bleeding, infection, nerve damage, paralysis, failed block, incomplete pain control, headache, blood pressure changes, nausea, vomiting, reactions to medication both or allergic, itching and postpartum back pain. Confirmed with bedside nurse the patient's most recent platelet count. Confirmed with patient that they are not currently taking any anticoagulation, have any bleeding history or any family history of bleeding disorders. Patient expressed understanding and wished to proceed. All questions were answered. Sterile technique was used throughout the entire procedure. Please see nursing notes for vital signs. Test dose was given through epidural catheter and negative prior to continuing to dose epidural or start infusion. Warning signs of high block given to the patient including shortness of breath, tingling/numbness in hands, complete motor block,  or any concerning symptoms with instructions to call for help. Patient was given instructions on fall risk and not to get out of bed. All questions and concerns addressed with instructions to call with any issues or inadequate analgesia.  Reason for block:procedure for pain

## 2020-12-24 ENCOUNTER — Encounter (HOSPITAL_COMMUNITY)
Admission: AD | Disposition: A | Discharge status: Home / Self Care | Payer: Self-pay | Source: Home / Self Care | Attending: Obstetrics & Gynecology

## 2020-12-24 ENCOUNTER — Encounter (HOSPITAL_COMMUNITY): Payer: Self-pay | Admitting: Obstetrics & Gynecology

## 2020-12-24 ENCOUNTER — Inpatient Hospital Stay (HOSPITAL_COMMUNITY): Payer: Medicaid Other | Admitting: Anesthesiology

## 2020-12-24 HISTORY — PX: TUBAL LIGATION: SHX77

## 2020-12-24 LAB — CBC
HCT: 27.6 % — ABNORMAL LOW (ref 36.0–46.0)
Hemoglobin: 9.3 g/dL — ABNORMAL LOW (ref 12.0–15.0)
MCH: 25.1 pg — ABNORMAL LOW (ref 26.0–34.0)
MCHC: 33.7 g/dL (ref 30.0–36.0)
MCV: 74.6 fL — ABNORMAL LOW (ref 80.0–100.0)
Platelets: 270 10*3/uL (ref 150–400)
RBC: 3.7 MIL/uL — ABNORMAL LOW (ref 3.87–5.11)
RDW: 15 % (ref 11.5–15.5)
WBC: 14.5 10*3/uL — ABNORMAL HIGH (ref 4.0–10.5)
nRBC: 0 % (ref 0.0–0.2)

## 2020-12-24 LAB — RPR: RPR Ser Ql: NONREACTIVE

## 2020-12-24 SURGERY — LIGATION, FALLOPIAN TUBE, POSTPARTUM
Anesthesia: Epidural | Laterality: Bilateral

## 2020-12-24 MED ORDER — ONDANSETRON HCL 4 MG/2ML IJ SOLN
INTRAMUSCULAR | Status: AC
  Filled 2020-12-24: qty 2

## 2020-12-24 MED ORDER — KETOROLAC TROMETHAMINE 30 MG/ML IJ SOLN
INTRAMUSCULAR | Status: AC
  Filled 2020-12-24: qty 1

## 2020-12-24 MED ORDER — FAMOTIDINE 20 MG PO TABS
40.0000 mg | ORAL_TABLET | Freq: Once | ORAL | Status: AC
  Administered 2020-12-24: 40 mg via ORAL
  Filled 2020-12-24: qty 2

## 2020-12-24 MED ORDER — KETOROLAC TROMETHAMINE 30 MG/ML IJ SOLN
INTRAMUSCULAR | Status: DC | PRN
  Administered 2020-12-24: 30 mg via INTRAVENOUS

## 2020-12-24 MED ORDER — BUPIVACAINE HCL (PF) 0.25 % IJ SOLN
INTRAMUSCULAR | Status: DC | PRN
  Administered 2020-12-24: 6 mL
  Administered 2020-12-24: 10 mL

## 2020-12-24 MED ORDER — LIDOCAINE HCL (PF) 2 % IJ SOLN
INTRAMUSCULAR | Status: AC
  Filled 2020-12-24: qty 20

## 2020-12-24 MED ORDER — EPINEPHRINE PF 1 MG/ML IJ SOLN
INTRAMUSCULAR | Status: AC
  Filled 2020-12-24: qty 1

## 2020-12-24 MED ORDER — FENTANYL CITRATE (PF) 100 MCG/2ML IJ SOLN
INTRAMUSCULAR | Status: AC
  Filled 2020-12-24: qty 2

## 2020-12-24 MED ORDER — ONDANSETRON HCL 4 MG/2ML IJ SOLN
INTRAMUSCULAR | Status: DC | PRN
  Administered 2020-12-24: 4 mg via INTRAVENOUS

## 2020-12-24 MED ORDER — LACTATED RINGERS IV SOLN: INTRAVENOUS | Status: DC

## 2020-12-24 MED ORDER — METOCLOPRAMIDE HCL 10 MG PO TABS
10.0000 mg | ORAL_TABLET | Freq: Once | ORAL | Status: AC
  Administered 2020-12-24: 10 mg via ORAL
  Filled 2020-12-24: qty 1

## 2020-12-24 MED ORDER — FENTANYL CITRATE (PF) 100 MCG/2ML IJ SOLN
INTRAMUSCULAR | Status: DC | PRN
  Administered 2020-12-24: 100 ug via INTRAVENOUS

## 2020-12-24 MED ORDER — LACTATED RINGERS IV SOLN: INTRAVENOUS | Status: DC | PRN

## 2020-12-24 MED ORDER — BUPIVACAINE HCL (PF) 0.25 % IJ SOLN
INTRAMUSCULAR | Status: AC
  Filled 2020-12-24: qty 30

## 2020-12-24 MED ORDER — LIDOCAINE-EPINEPHRINE (PF) 2 %-1:200000 IJ SOLN
INTRAMUSCULAR | Status: DC | PRN
  Administered 2020-12-24: 5 mL via INTRADERMAL
  Administered 2020-12-24: 3 mL via INTRADERMAL
  Administered 2020-12-24: 7 mL via INTRADERMAL
  Administered 2020-12-24: 5 mL via INTRADERMAL

## 2020-12-24 SURGICAL SUPPLY — 27 items
CLIP FILSHIE TUBAL LIGA STRL (Clip) IMPLANT
CLOTH BEACON ORANGE TIMEOUT ST (SAFETY) ×2 IMPLANT
DERMABOND ADVANCED (GAUZE/BANDAGES/DRESSINGS) ×1
DERMABOND ADVANCED .7 DNX12 (GAUZE/BANDAGES/DRESSINGS) ×1 IMPLANT
DRESSING OPSITE X SMALL 2X3 (GAUZE/BANDAGES/DRESSINGS) ×2 IMPLANT
DRSG OPSITE POSTOP 3X4 (GAUZE/BANDAGES/DRESSINGS) ×2 IMPLANT
DURAPREP 26ML APPLICATOR (WOUND CARE) ×2 IMPLANT
ELECT REM PT RETURN 9FT ADLT (ELECTROSURGICAL) ×2
ELECTRODE REM PT RTRN 9FT ADLT (ELECTROSURGICAL) ×1 IMPLANT
GLOVE BIO SURGEON STRL SZ 6.5 (GLOVE) ×2 IMPLANT
GLOVE BIOGEL PI IND STRL 7.0 (GLOVE) ×3 IMPLANT
GLOVE BIOGEL PI INDICATOR 7.0 (GLOVE) ×3
GOWN STRL REUS W/TWL LRG LVL3 (GOWN DISPOSABLE) ×4 IMPLANT
LIGASURE IMPACT 36 18CM CVD LR (INSTRUMENTS) ×2 IMPLANT
NEEDLE HYPO 22GX1.5 SAFETY (NEEDLE) ×2 IMPLANT
NS IRRIG 1000ML POUR BTL (IV SOLUTION) ×2 IMPLANT
PACK ABDOMINAL MINOR (CUSTOM PROCEDURE TRAY) ×2 IMPLANT
PENCIL BUTTON HOLSTER BLD 10FT (ELECTRODE) ×2 IMPLANT
PROTECTOR NERVE ULNAR (MISCELLANEOUS) ×2 IMPLANT
SPONGE LAP 4X18 RFD (DISPOSABLE) ×2 IMPLANT
SUT MON AB 4-0 PS1 27 (SUTURE) ×2 IMPLANT
SUT PLAIN 0 NONE (SUTURE) IMPLANT
SUT VICRYL 0 UR6 27IN ABS (SUTURE) ×2 IMPLANT
SYR CONTROL 10ML LL (SYRINGE) ×2 IMPLANT
TOWEL OR 17X24 6PK STRL BLUE (TOWEL DISPOSABLE) ×4 IMPLANT
TRAY FOLEY CATH SILVER 14FR (SET/KITS/TRAYS/PACK) ×2 IMPLANT
WATER STERILE IRR 1000ML POUR (IV SOLUTION) ×2 IMPLANT

## 2020-12-24 NOTE — Anesthesia Preprocedure Evaluation (Signed)
Anesthesia Evaluation  Patient identified by MRN, date of birth, ID band Patient awake    Reviewed: Allergy & Precautions, NPO status , Patient's Chart, lab work & pertinent test results  Airway Mallampati: III  TM Distance: >3 FB Neck ROM: Full    Dental no notable dental hx.    Pulmonary neg pulmonary ROS,    Pulmonary exam normal breath sounds clear to auscultation       Cardiovascular hypertension, Normal cardiovascular exam Rhythm:Regular Rate:Normal     Neuro/Psych  Headaches, PSYCHIATRIC DISORDERS Depression    GI/Hepatic negative GI ROS, Neg liver ROS,   Endo/Other  negative endocrine ROS  Renal/GU negative Renal ROS  negative genitourinary   Musculoskeletal negative musculoskeletal ROS (+)   Abdominal (+) + obese,   Peds  Hematology negative hematology ROS (+)   Anesthesia Other Findings IOL at term  Reproductive/Obstetrics                             Anesthesia Physical  Anesthesia Plan  ASA: III  Anesthesia Plan: Epidural   Post-op Pain Management:    Induction:   PONV Risk Score and Plan: 2 and Treatment may vary due to age or medical condition, Ondansetron and Midazolam  Airway Management Planned: Natural Airway  Additional Equipment:   Intra-op Plan:   Post-operative Plan:   Informed Consent: I have reviewed the patients History and Physical, chart, labs and discussed the procedure including the risks, benefits and alternatives for the proposed anesthesia with the patient or authorized representative who has indicated his/her understanding and acceptance.       Plan Discussed with: Anesthesiologist  Anesthesia Plan Comments:         Anesthesia Quick Evaluation

## 2020-12-24 NOTE — Anesthesia Postprocedure Evaluation (Signed)
Anesthesia Post Note  Patient: Web designer  Procedure(s) Performed: POST PARTUM TUBAL LIGATION (Bilateral )     Patient location during evaluation: PACU Anesthesia Type: Epidural Level of consciousness: awake and alert Pain management: pain level controlled Vital Signs Assessment: post-procedure vital signs reviewed and stable Respiratory status: spontaneous breathing, nonlabored ventilation and respiratory function stable Cardiovascular status: blood pressure returned to baseline and stable Postop Assessment: no apparent nausea or vomiting Anesthetic complications: no   No complications documented.  Last Vitals:  Vitals:   12/24/20 1045 12/24/20 1058  BP: 110/67 111/74  Pulse: 70 79  Resp: (!) 21 18  Temp:  36.7 C  SpO2:  100%    Last Pain:  Vitals:   12/24/20 1100  TempSrc:   PainSc: 0-No pain   Pain Goal:    LLE Motor Response: Purposeful movement (12/24/20 1045) LLE Sensation: Decreased (12/24/20 1045) RLE Motor Response: Purposeful movement (12/24/20 1045) RLE Sensation: Decreased (12/24/20 1045)     Epidural/Spinal Function Cutaneous sensation: Tingles (12/24/20 1100), Patient able to flex knees: Yes (12/24/20 1100), Patient able to lift hips off bed: Yes (12/24/20 1100), Back pain beyond tenderness at insertion site: No (12/24/20 1100), Progressively worsening motor and/or sensory loss: No (12/24/20 1100), Bowel and/or bladder incontinence post epidural: No (12/24/20 1100)  Lowella Curb

## 2020-12-24 NOTE — Transfer of Care (Signed)
Immediate Anesthesia Transfer of Care Note  Patient: Heather Riggs  Procedure(s) Performed: POST PARTUM TUBAL LIGATION (Bilateral )  Patient Location: PACU  Anesthesia Type:Epidural  Level of Consciousness: awake  Airway & Oxygen Therapy: Patient Spontanous Breathing  Post-op Assessment: Report given to RN  Post vital signs: Reviewed  Last Vitals:  Vitals Value Taken Time  BP 110/60 12/24/20 0945  Temp 36.8 C 12/24/20 0945  Pulse 73 12/24/20 0948  Resp 18 12/24/20 0948  SpO2 99 % 12/24/20 0948  Vitals shown include unvalidated device data.  Last Pain:  Vitals:   12/24/20 0945  TempSrc: Oral  PainSc: 1          Complications: No complications documented.

## 2020-12-24 NOTE — Op Note (Signed)
OPERATIVE NOTE  Heather Riggs  DOB:    04/21/1991  MRN:    [id number]  CSN:    [id number]  Date of Surgery:  12/24/2020   Preoperative Diagnosis: Desired Sterilization  Postoperative Diagnosis: Same as above  Procedure:  Post partum tubal ligation   Surgeon: Delila Spence. Mora Appl, M.D.  Assistant: None  Anesthetic: Local, Epidural    Fluids: 500 mL EBL:5  mL Complications: None  Indication:  [name] is a 30 y.o. year old female,who desires permanent sterilization. Risks, benefits, alternatives and indication was explained to patient prior to the procedure.   Findings:  After adequate level of epidural was determined for surgery the patient was prepped and draped in the usual sterile fashion.  After infiltration with 0.25% Marcaine in the area a small 1cm subumbilical incision was performed ausing a 15 blade scalpel  The fascia was entered sharply   using Mayo scissors and tagged using 0-vicryl. The peritoneum identified and easily entered. The fundus of the uterus was easily located with the surgeon's fingers through the incision and the left fallopian tube was swept into view so that the midportion of the tube could be grasped with Babcock clamps and delivered. As the loop was held up, the fallopian tube was traced down to the fimbriated end to ensure that this was the fallopian tube. Using the Ligasure Impact the mesosalpinx was clamped, cauterized and transected from the fimbriated end to the insertion into the uterus.  The tubal segment was handed off to be sent to Pathology. The process was repeated on the opposite side. Hemostasis was good. An umbilical hernia was noted and so the fascia was reapproximated with 0 vicrylon a UR6  in running fashion. The skin was reapproximated with subcuticular stitch of 4-0 monocryl. The patient's incision was cleaned and dermabond was placed.   The patient tolerated the procedure well and was sent to the PACU in good condition. All  instrument, sponge and needle counts were correct x 2  PINN, WALDA [name]

## 2020-12-24 NOTE — Progress Notes (Signed)
CSW received consult for hx of Depression.  CSW met with MOB to offer support and complete assessment.    CSW met with MOB at bedside and introduced self, FOB present. CSW asked FOB to leave the room to speak with MOB privately, FOB left the room. CSW explained reason for consult. MOB was welcoming, pleasant and remained engaged during assessment. CSW and MOB discussed MOB's mental health history. MOB reported that she experienced a little depression during pregnancy. MOB described her depression as isolating and being tearful. MOB reported that she is not taking medication nor participating in therapy to treat depressive symptoms. MOB reported that she last experienced depressive symptoms a couple days ago. CSW inquired about MOB's coping skills, MOB reported that communicating with friends and family is helpful. CSW inquired about MOB's support system, MOB reported that FOB is a support. MOB denied any additional mental health history and reported that she had not experienced depression prior to pregnancy. MOB denied any history of postpartum depression. CSW inquired about how MOB was feeling emotionally after giving birth, MOB reported that she was feeling okay. CSW explained to MOB that she may be more susceptible to postpartum depression due to her mental health history. MOB presented calm and did not demonstrate any acute mental health signs/symptoms. CSW assessed for safety, MOB denied SI, HI and domestic violence.   CSW provided education regarding the baby blues period vs. perinatal mood disorders, discussed treatment and gave resources for mental health follow up if concerns arise.  CSW recommends self-evaluation during the postpartum time period using the New Mom Checklist from Postpartum Progress and encouraged MOB to contact a medical professional if symptoms are noted at any time.    CSW provided review of Sudden Infant Death Syndrome (SIDS) precautions. MOB verbalized understanding. MOB reported  having all items needed to care for infant including a car seat and basinet.   CSW identifies no further need for intervention and no barriers to discharge at this time.  Demiah Gullickson, LCSW Clinical Social Worker [facility] Cell#: [phone]  

## 2020-12-24 NOTE — Progress Notes (Signed)
PPD# 1 SVD  Information for the patient's newborn:  Rhythm, Gubbels Girl Ruweyda [[id number]]  female    Baby Name Aleayah Circumcision N/A  S/P PP BTL  S:   Reports feeling "really good" Tolerating PO fluid and solids No nausea or vomiting Bleeding is light Pain controlled with PO meds prior to surgery, PRN IV meds in the post-op period Up ad lib / ambulatory / voiding w/o difficulty Feeding: Bottle    O:   VS: BP 131/74 (BP Location: Left Arm)   Pulse 60   Temp 98 F (36.7 C) (Oral)   Resp 18   Ht 6' (1.829 m)   Wt 122.2 kg   LMP 03/13/2020   SpO2 100%   Breastfeeding Unknown   BMI 36.55 kg/m   LABS:  Recent Labs    12/23/20 1615 12/24/20 0535  WBC 11.3* 14.5*  HGB 9.8* 9.3*  PLT 284 270   Blood type: --/--/O POS (03/18 1615) Rubella:                        I&O: Intake/Output      03/18 0701 03/19 0700 03/19 0701 03/20 0700   I.V. (mL/kg)  500 (4.1)   Total Intake(mL/kg)  500 (4.1)   Urine (mL/kg/hr)  100 (0.1)   Blood 25 5   Total Output 25 105   Net -25 +395          Physical Exam: Alert and oriented X3 Lungs: Clear and unlabored Heart: regular rate and rhythm / no mumurs Abdomen: soft, non-tender, non-distended, dressing, D/I  Fundus: firm, non-tender Perineum: intact, non-edematous Lochia: appropriate Extremities: trace, non-pitting edema, no calf pain or tenderness    A:  PPD # 1  Normal exam S/P PP BTL     -stable  P:  Routine post partum orders Anticipate D/C on 12/25/20  Plan reviewed w/ Dr. Merryl Hacker, MSN, CNM 12/24/2020, 12:29 PM

## 2020-12-25 ENCOUNTER — Encounter (HOSPITAL_COMMUNITY): Payer: Self-pay | Admitting: Obstetrics & Gynecology

## 2020-12-25 DIAGNOSIS — F32A Depression, unspecified: Secondary | ICD-10-CM | POA: Diagnosis present

## 2020-12-25 DIAGNOSIS — D62 Acute posthemorrhagic anemia: Secondary | ICD-10-CM | POA: Diagnosis not present

## 2020-12-25 DIAGNOSIS — Z3490 Encounter for supervision of normal pregnancy, unspecified, unspecified trimester: Secondary | ICD-10-CM | POA: Diagnosis present

## 2020-12-25 DIAGNOSIS — Z9851 Tubal ligation status: Secondary | ICD-10-CM

## 2020-12-25 MED ORDER — IBUPROFEN 600 MG PO TABS: 600.0000 mg | ORAL_TABLET | Freq: Four times a day (QID) | ORAL | 0 refills | Status: DC

## 2020-12-25 MED ORDER — SERTRALINE HCL 50 MG PO TABS: 50.0000 mg | ORAL_TABLET | Freq: Every day | ORAL | 0 refills | Status: AC

## 2020-12-25 MED ORDER — OXYCODONE HCL 5 MG PO TABS: 5.0000 mg | ORAL_TABLET | Freq: Three times a day (TID) | ORAL | 0 refills | Status: AC | PRN

## 2020-12-25 NOTE — Discharge Summary (Signed)
SVD and PP Tubal OB Discharge Summary     Patient Name: Heather Riggs DOB: 02/27/1991 MRN: [id number]  Date of admission: 12/23/2020 Delivering MD: Dale Chilili  Date of delivery: 12/23/2020 Type of delivery: SVD  Newborn Data: Sex: Baby Female Live born female  Birth Weight: 6 lb 13.9 oz (3116 g) APGAR: 8, 9  Newborn Delivery   Birth date/time: 12/23/2020 21:34:00 Delivery type: Vaginal, Spontaneous      Feeding: breast Infant being discharge to home with mother in stable condition.   Admitting diagnosis: [redacted] weeks gestation of pregnancy [Z3A.39] Intrauterine pregnancy: [redacted]w[redacted]d     Secondary diagnosis:  Active Problems:   SVD (spontaneous vaginal delivery)   Encounter for elective induction of labor   Normal postpartum course   Acute blood loss anemia   Depression   S/P tubal ligation                                Complications: None                                                              Intrapartum Procedures: spontaneous vaginal delivery and tubal ligation Postpartum Procedures: none Complications-Operative and Postpartum: right labiel tear no repair Augmentation: AROM and Pitocin   History of Present Illness: [name] is a 30 y.o. female, [phone], who presents at [redacted]w[redacted]d weeks gestation. The patient has been followed at  [facility]  Her pregnancy has been complicated by:  Patient Active Problem List   Diagnosis Date Noted  . Encounter for elective induction of labor 12/25/2020  . Normal postpartum course 12/25/2020  . Acute blood loss anemia 12/25/2020  . Depression 12/25/2020  . S/P tubal ligation 12/25/2020  . SVD (spontaneous vaginal delivery) 08/28/2019    Hospital course:  IOL with vaginal delivery     30 y.o. yo [id number] at [redacted]w[redacted]d was admitted for Elective IOL on 12/23/2020. Patient had an uncomplicated labor course as follows:  Membrane Rupture Time/Date: 4:37 PM ,12/23/2020    Delivery Method:Vaginal, Spontaneous  Episiotomy: None  Lacerations:  Labial  Patient had an uncomplicated postpartum course.  She is ambulating, tolerating a regular diet, passing flatus, and urinating well. Patient is discharged home in stable condition on 12/25/20.  Newborn Data: Birth date:12/23/2020  Birth time:9:34 PM  Gender:Female  Living status:Living  Apgars:8 ,9  Weight:3116 g  Postpartum Day # 2 : S/P NSVD due to [facility], 30 y.o., @ [redacted]w[redacted]d,  [id number], who was admitted for [facility] a 29 y.o.female, [id number], IUP at39weeks, presenting for Elective IOL at term.Sickle cell trait. Depression with speratic 50mg  zoloft. Progressed with AROM and pitocin, SVD over intact pernium, ebl , hgb drop from 9.8-9.3, on iron, asymptomatic. Patient up ad lib, denies syncope or dizziness. Reports consuming regular diet without issues and denies N/V. Patient reports 0 bowel movement + passing flatus.  Denies issues with urination and reports bleeding is "lighter."  Patient is breastfeeding and reports going well.  Received tubal on 3/19 with Dr 4/19, tolerated well for postpartum contraception.  Pain is being appropriately managed with use of po meds. Mood stable, denies SI/HI will continue on zoloft 50mg , mood check in 2  weeks.   Physical exam  Vitals:   12/24/20 1620 12/24/20 2020 12/25/20 0000 12/25/20 0430  BP: 114/70 114/68 108/70 111/66  Pulse: 77 76 80 79  Resp: 18 17 18 17   Temp: 98.6 F (37 C) 98.7 F (37.1 C) 98.4 F (36.9 C) 98 F (36.7 C)  TempSrc: Oral Oral Oral Oral  SpO2: 100% 100% 100% 99%  Weight:      Height:       General: alert, cooperative and no distress Lochia: appropriate Uterine Fundus: firm Incision: Healing well with no significant drainage, No significant erythema, Dressing is clean, dry, and intact, honeycomb dressing CDI Perineum: intact DVT Evaluation: No evidence of DVT seen on physical exam. Negative Homan's  sign. No cords or calf tenderness. No significant calf/ankle edema.  Labs: Lab Results  Component Value Date   WBC 14.5 (H) 12/24/2020   HGB 9.3 (L) 12/24/2020   HCT 27.6 (L) 12/24/2020   MCV 74.6 (L) 12/24/2020   PLT 270 12/24/2020   CMP Latest Ref Rng & Units 01/25/2019  Glucose 70 - 99 mg/dL 96  BUN 6 - 20 mg/dL 7  Creatinine 01/27/2019 - 2.20 mg/dL 2.54  Sodium 2.70 - 623 mmol/L 134(L)  Potassium 3.5 - 5.1 mmol/L 3.8  Chloride 98 - 111 mmol/L 107  CO2 22 - 32 mmol/L 21(L)  Calcium 8.9 - 10.3 mg/dL 9.2  Total Protein 6.5 - 8.1 g/dL 6.7  Total Bilirubin 0.3 - 1.2 mg/dL 0.7  Alkaline Phos 38 - 126 U/L 52  AST 15 - 41 U/L 15  ALT 0 - 44 U/L 10    Date of discharge: 12/25/2020 Discharge Diagnoses: Term Pregnancy-delivered Discharge instruction: per After Visit Summary and "Baby and Me Booklet".  After visit meds:   Activity:           unrestricted and pelvic rest Advance as tolerated. Pelvic rest for 6 weeks.  Diet:                routine Medications: PNV, Ibuprofen, Colace, Iron and tylenol, oxy Postpartum contraception: Tubal Ligation Condition:  Pt discharge to home with baby in stable and  condition  Anemia: Iron PO Depression: zoloft, 2 week mood check  Meds: Allergies as of 12/25/2020   No Known Allergies     Medication List    TAKE these medications   acetaminophen 500 MG tablet Commonly known as: TYLENOL Take 1 tablet (500 mg total) by mouth every 6 (six) hours as needed.   ibuprofen 600 MG tablet Commonly known as: ADVIL Take 1 tablet (600 mg total) by mouth every 6 (six) hours.   multivitamin-prenatal 27-0.8 MG Tabs tablet Take 1 tablet by mouth daily at 12 noon.   oxyCODONE 5 MG immediate release tablet Commonly known as: Roxicodone Take 1 tablet (5 mg total) by mouth every 8 (eight) hours as needed.   sertraline 50 MG tablet Commonly known as: ZOLOFT Take 1 tablet (50 mg total) by mouth daily.            Discharge Care Instructions  (From  admission, onward)         Start     Ordered   12/25/20 0000  Discharge wound care:       Comments: Take dressing off on day 5-7 postpartum.  Report increased drainage, redness or warmth. Clean with water, let soap trickle down body. Can leave steri strips on until they fall off or take them off gently at day 10. Keep open to air, clean  and dry.   12/25/20 [id number]          Discharge Follow Up:   Follow-up Information    [facility]. Schedule an appointment as soon as possible for a visit in 2 week(s).   Specialty: Obstetrics and Gynecology Why: 2 week mood check and 6 week PPV Contact information: [address] [place] [place] [address] [place] [address] [phone]               [place], NP-C, CNM 12/25/2020, 8:44 AM  Dale Tolchester, FNP

## 2020-12-25 NOTE — Anesthesia Postprocedure Evaluation (Signed)
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  Last Vitals: There were no vitals filed for this visit.  Last Pain: There were no vitals filed for this visit. Pain Goal:                   Chelsey L Woodrum

## 2020-12-27 LAB — SURGICAL PATHOLOGY

## 2021-07-04 ENCOUNTER — Encounter (HOSPITAL_COMMUNITY): Payer: Self-pay | Admitting: Emergency Medicine

## 2021-07-04 ENCOUNTER — Ambulatory Visit (HOSPITAL_COMMUNITY)
Admission: EM | Admit: 2021-07-04 | Discharge: 2021-07-04 | Disposition: A | Discharge status: Home / Self Care | Payer: Medicaid Other | Attending: Student | Admitting: Student

## 2021-07-04 ENCOUNTER — Other Ambulatory Visit: Payer: Self-pay

## 2021-07-04 DIAGNOSIS — K0889 Other specified disorders of teeth and supporting structures: Secondary | ICD-10-CM | POA: Diagnosis not present

## 2021-07-04 MED ORDER — LIDOCAINE VISCOUS HCL 2 % MT SOLN: 5.0000 mL | Freq: Three times a day (TID) | OROMUCOSAL | 0 refills | Status: DC | PRN

## 2021-07-04 NOTE — ED Provider Notes (Signed)
[facility]    CSN: [id number] Arrival date & time: 07/04/21  0865      History   Chief Complaint Chief Complaint  Patient presents with   Abscess    Inside right side of mouth     HPI Heather Riggs is a 30 y.o. female presenting with dental pain R lower molar. Medical history noncontributory. States she had all 4 of her her wisdom teeth removed 1 year ago. States it looks a little swollen in that area but denies foul taste in mouth, pain under jaw, pain under tongue, sore throat, trouble swallowing.  HPI  Past Medical History:  Diagnosis Date   Headache    PIH (pregnancy induced hypertension)    With first pregnancy in 2018   Pregnancy induced hypertension    gHTN; no meds   Vaginal Pap smear, abnormal     Patient Active Problem List   Diagnosis Date Noted   Encounter for elective induction of labor 12/25/2020   Normal postpartum course 12/25/2020   Acute blood loss anemia 12/25/2020   Depression 12/25/2020   S/P tubal ligation 12/25/2020   SVD (spontaneous vaginal delivery) 08/28/2019    Past Surgical History:  Procedure Laterality Date   BLADDER SURGERY     30 years old   DILATION AND EVACUATION N/A 07/21/2018   Procedure: SUCTION DILATATION AND EVACUATION;  Surgeon: Essie Hart, MD;  Location: [facility];  Service: Gynecology;  Laterality: N/A;   TUBAL LIGATION Bilateral 12/24/2020   Procedure: POST PARTUM TUBAL LIGATION;  Surgeon: Essie Hart, MD;  Location: [facility];  Service: Gynecology;  Laterality: Bilateral;    OB History     Gravida  4   Para  3   Term  3   Preterm  0   AB  1   Living  3      SAB  1   IAB  0   Ectopic  0   Multiple  0   Live Births  3            Home Medications    Prior to Admission medications   Medication Sig Start Date End Date Taking? Authorizing Provider  magic mouthwash (lidocaine, diphenhydrAMINE, alum & mag hydroxide) suspension Swish and spit 5 mLs 3  (three) times daily as needed for mouth pain. Swish and spit up to 3x daily while symptoms persist 07/04/21  Yes Rhys Martini, PA-C  acetaminophen (TYLENOL) 500 MG tablet Take 1 tablet (500 mg total) by mouth every 6 (six) hours as needed. 03/05/19   Fayrene Helper, PA-C  ibuprofen (ADVIL) 600 MG tablet Take 1 tablet (600 mg total) by mouth every 6 (six) hours. 12/25/20   Dale [place], FNP  oxyCODONE (ROXICODONE) 5 MG immediate release tablet Take 1 tablet (5 mg total) by mouth every 8 (eight) hours as needed. 12/25/20 12/25/21  Dale [place], FNP  Prenatal Vit-Fe Fumarate-FA (MULTIVITAMIN-PRENATAL) 27-0.8 MG TABS tablet Take 1 tablet by mouth daily at 12 noon.    [provider]  sertraline (ZOLOFT) 50 MG tablet Take 1 tablet (50 mg total) by mouth daily. 12/25/20   Dale [place], FNP    Family History Family History  Problem Relation Age of Onset   Hypertension Father     Social History Social History   Tobacco Use   Smoking status: Never   Smokeless tobacco: Never  Vaping Use   Vaping Use: Never used  Substance Use Topics   Alcohol use: Never  Drug use: Never     Allergies   Patient has no known allergies.   Review of Systems Review of Systems  HENT:  Positive for dental problem.   All other systems reviewed and are negative.   Physical Exam Triage Vital Signs ED Triage Vitals [07/04/21 0837]  Enc Vitals Group     BP 122/70     Pulse Rate (!) 58     Resp      Temp 98.6 F (37 C)     Temp Source Oral     SpO2 100 %     Weight      Height      Head Circumference      Peak Flow      Pain Score 9     Pain Loc      Pain Edu?      Excl. in GC?    No data found.  Updated Vital Signs BP 122/70 (BP Location: Left Arm)   Pulse (!) 58   Temp 98.6 F (37 C) (Oral)   LMP 06/27/2021   SpO2 100%   Visual Acuity Right Eye Distance:   Left Eye Distance:   Bilateral Distance:    Right Eye Near:   Left Eye Near:    Bilateral Near:     Physical  Exam Vitals reviewed.  Constitutional:      General: She is not in acute distress.    Appearance: Normal appearance. She is not ill-appearing, toxic-appearing or diaphoretic.  HENT:     Head: Normocephalic and atraumatic.     Jaw: There is normal jaw occlusion. No trismus, tenderness, swelling, pain on movement or malocclusion.     Salivary Glands: Right salivary gland is not diffusely enlarged or tender. Left salivary gland is not diffusely enlarged or tender.     Right Ear: Hearing normal.     Left Ear: Hearing normal.     Nose: Nose normal.     Mouth/Throat:     Lips: Pink.     Mouth: Mucous membranes are moist. No lacerations or oral lesions.     Dentition: Does not have dentures. Dental tenderness present. No dental caries.     Tongue: No lesions. Tongue does not deviate from midline.     Palate: No mass.     Pharynx: Oropharynx is clear. Uvula midline. No oropharyngeal exudate or posterior oropharyngeal erythema.     Tonsils: No tonsillar exudate or tonsillar abscesses.     Comments: Gingival tenderness over area where R lower wisdom tooth should be. Tooth appears to be emerging, though patient states her wisdom teeth were removed. No gingival swelling, erythema, discharge. No trismus, drooling, sore throat, voice changes, swelling underneath the tongue, swelling underneath the jaw, neck stiffness.  Eyes:     Extraocular Movements: Extraocular movements intact.     Pupils: Pupils are equal, round, and reactive to light.  Pulmonary:     Effort: Pulmonary effort is normal.  Neurological:     General: No focal deficit present.     Mental Status: She is alert and oriented to person, place, and time.  Psychiatric:        Mood and Affect: Mood normal.        Behavior: Behavior normal.        Thought Content: Thought content normal.        Judgment: Judgment normal.     UC Treatments / Results  Labs (all labs ordered are listed, but only abnormal results are  displayed) Labs  Reviewed - No data to display  EKG   Radiology No results found.  Procedures Procedures (including critical care time)  Medications Ordered in UC Medications - No data to display  Initial Impression / Assessment and Plan / UC Course  I have reviewed the triage vital signs and the nursing notes.  Pertinent labs & imaging results that were available during my care of the patient were reviewed by me and considered in my medical decision making (see chart for details).     This patient is a very pleasant 30 y.o. year old female presenting with dental pain. Tooth appears to be emerging though patient has had her wisdom teeth removed. Afebrile, nontachy, no infection present. Magic mouthwash sent, and rec f/u with dentist. ED return precautions discussed. Patient verbalizes understanding and agreement.    Final Clinical Impressions(s) / UC Diagnoses   Final diagnoses:  Pain, dental     Discharge Instructions      -Magic mouthwash 3-4 times daily as needed -Follow-up with dentist if symptoms worsen/persist   ED Prescriptions     Medication Sig Dispense Auth. Provider   magic mouthwash (lidocaine, diphenhydrAMINE, alum & mag hydroxide) suspension Swish and spit 5 mLs 3 (three) times daily as needed for mouth pain. Swish and spit up to 3x daily while symptoms persist 120 mL Rhys Martini, PA-C      PDMP not reviewed this encounter.   Rhys Martini, PA-C 07/04/21 [phone]

## 2021-07-04 NOTE — Discharge Instructions (Addendum)
-  Magic mouthwash 3-4 times daily as needed -Follow-up with dentist if symptoms worsen/persist

## 2021-07-04 NOTE — ED Triage Notes (Signed)
Pt c/o abscess inside of her mouth on the right side x 1 week.

## 2022-08-21 IMAGING — CR DG ANKLE COMPLETE 3+V*R*
3 series · 3 of 3 positions shown · non-contrast
Comparison: None.

CLINICAL DATA: Pt fell down 6 stairs yesterday - having pain and
swelling in right foot/ankle still today - most pain is located in
anterior/superior portions of foot and ankle

EXAM:
RIGHT ANKLE - COMPLETE 3+ VIEW

[ankle ap]
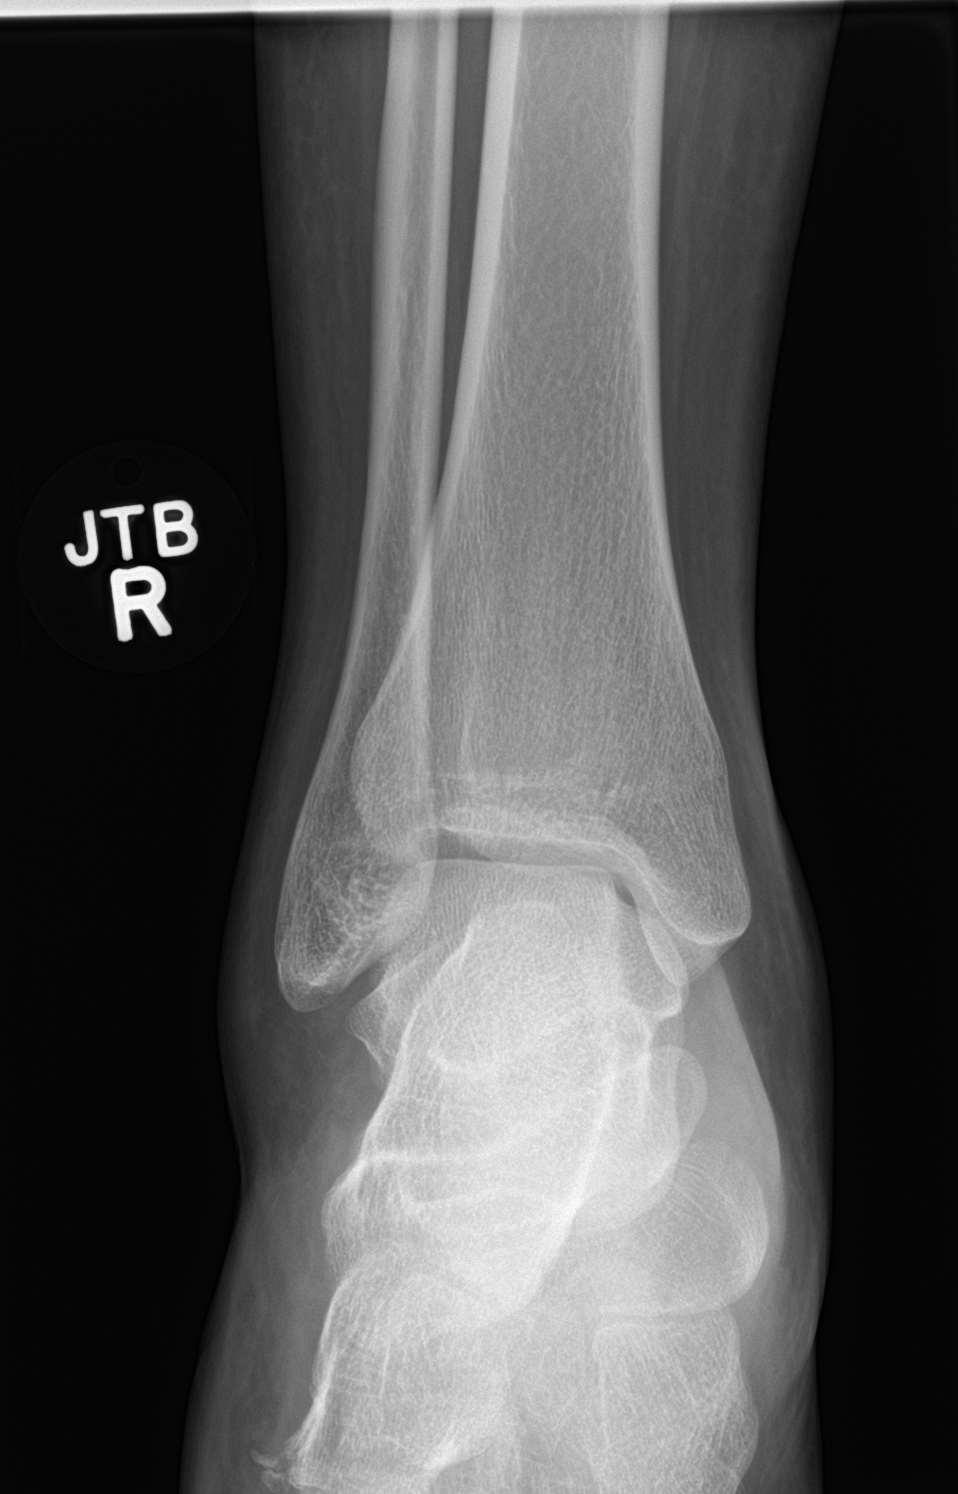

[ankle obl]
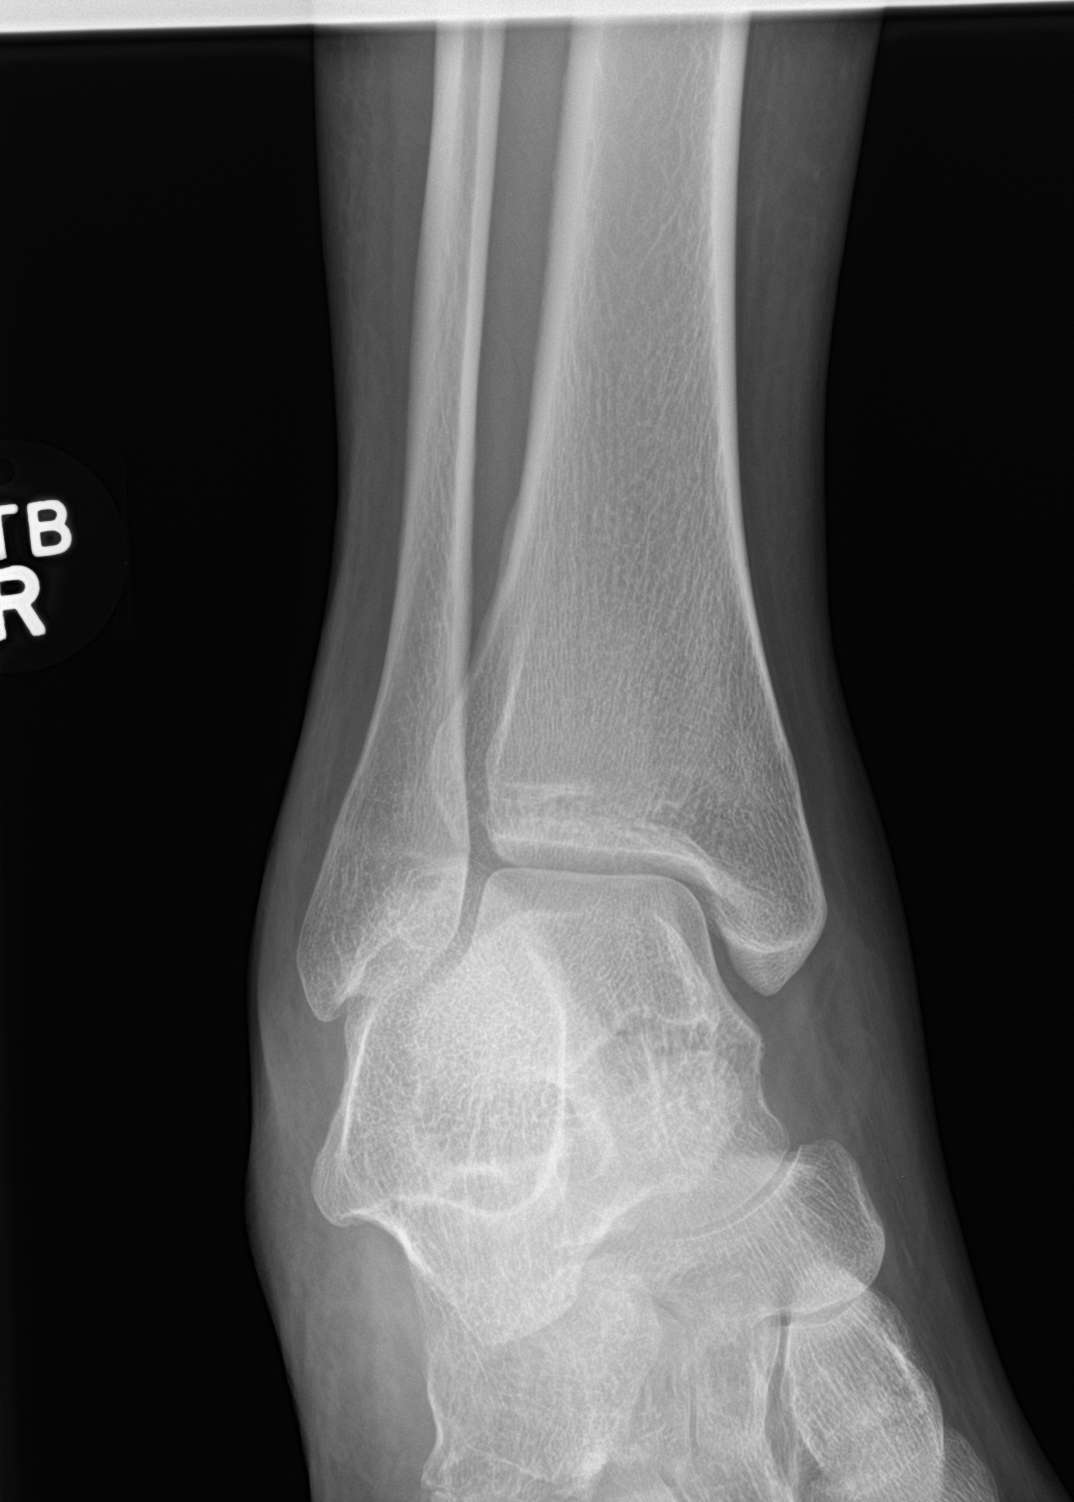

[ankle lat]
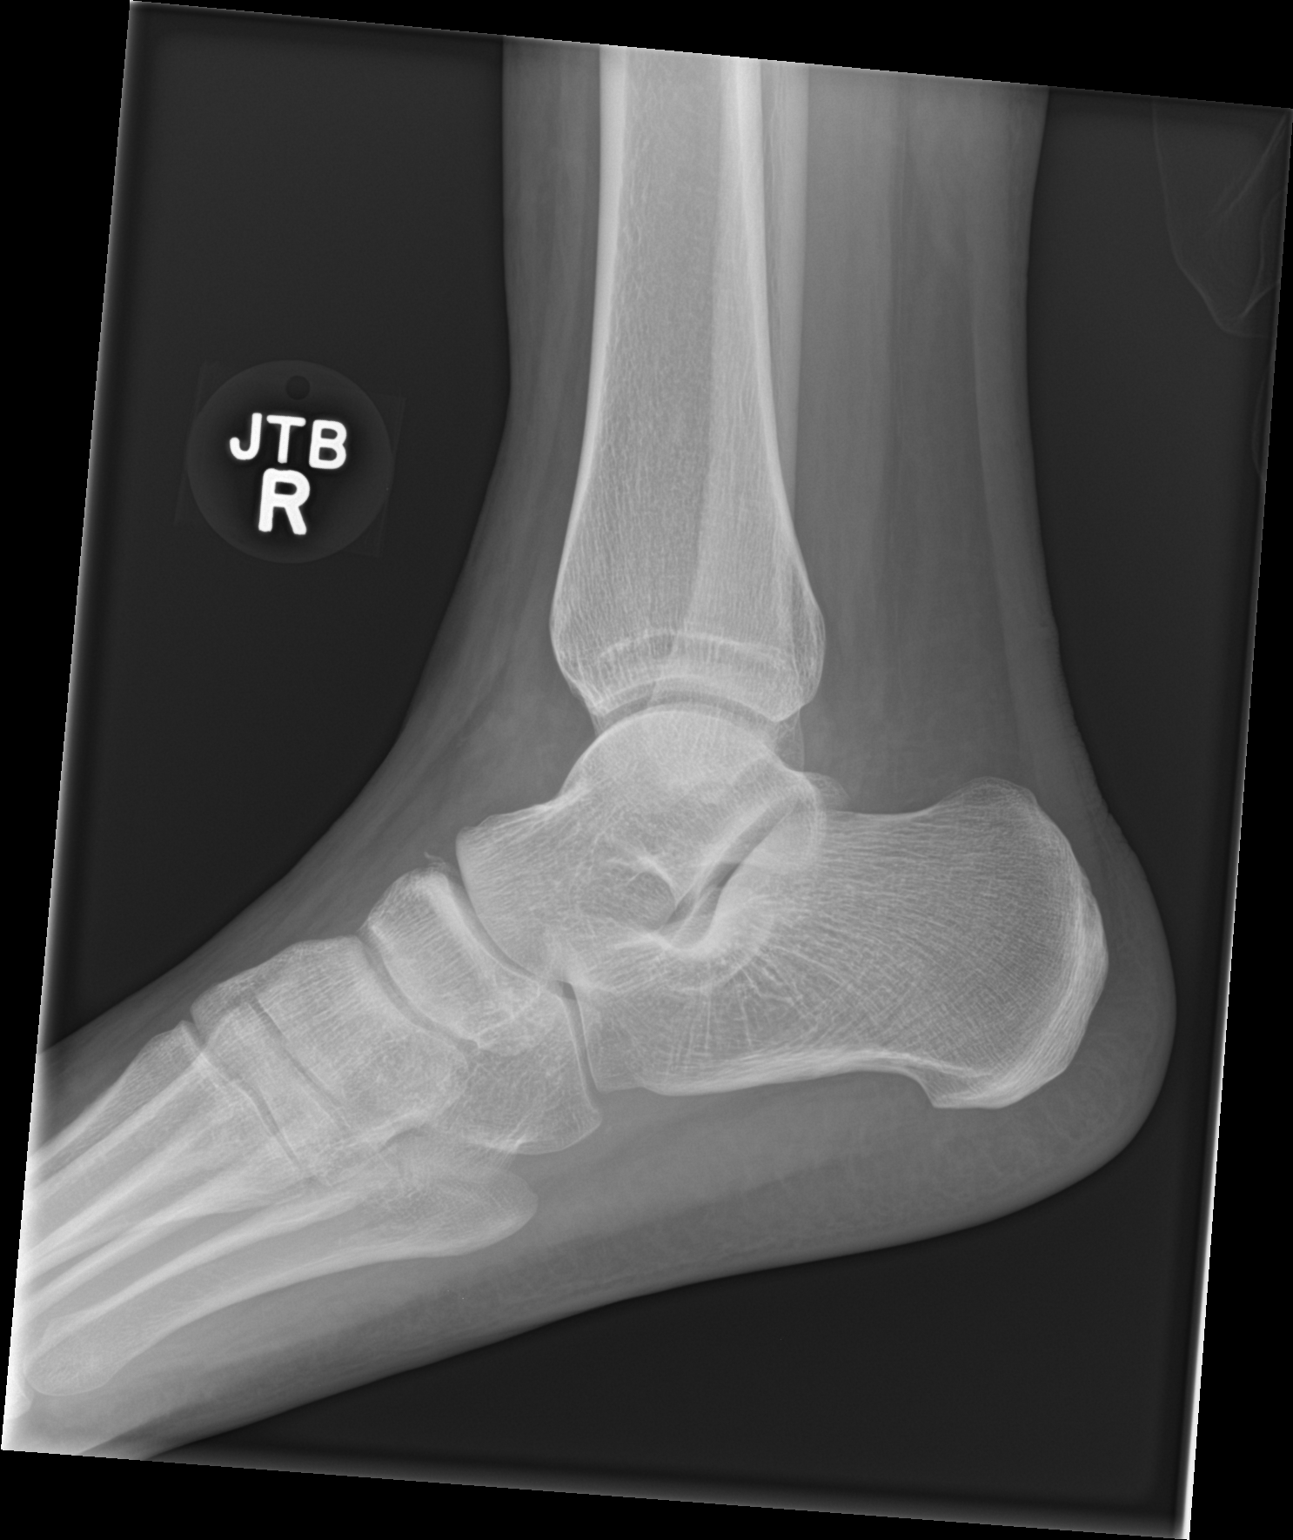

[3 of 3 positions shown; findings below may reference images not displayed]

FINDINGS: Small avulsion fracture from the dorsal margin of the navicular,
seen on the lateral view, with mild overlying soft tissue swelling.

No other fractures. No bone lesions. The ankle joint is normally
spaced and aligned. No arthropathic changes.
IMPRESSION: 1. Small avulsion fracture from the dorsal margin of the navicular
with associated soft tissue swelling.

## 2022-11-12 ENCOUNTER — Institutional Professional Consult (permissible substitution): Payer: Medicaid Other | Admitting: Plastic Surgery

## 2022-12-13 ENCOUNTER — Institutional Professional Consult (permissible substitution): Payer: Medicaid Other | Admitting: Plastic Surgery

## 2023-01-27 ENCOUNTER — Ambulatory Visit
Admission: RE | Admit: 2023-01-27 | Discharge: 2023-01-27 | Disposition: A | Discharge status: Home / Self Care | Payer: Medicaid Other | Source: Ambulatory Visit | Attending: Internal Medicine | Admitting: Internal Medicine

## 2023-01-27 ENCOUNTER — Ambulatory Visit (INDEPENDENT_AMBULATORY_CARE_PROVIDER_SITE_OTHER): Payer: Medicaid Other

## 2023-01-27 VITALS — BP 114/76 | HR 91 | Temp 97.8°F | Resp 16

## 2023-01-27 DIAGNOSIS — M79674 Pain in right toe(s): Secondary | ICD-10-CM

## 2023-01-27 NOTE — Discharge Instructions (Signed)
X-ray was normal.  Suspect bruising and inflammation.  Follow-up with podiatry if symptoms persist or worsen.

## 2023-01-27 NOTE — ED Triage Notes (Signed)
Pt c/o injuring the right 2nd toe several days ago states it has progressively gotten more painful.

## 2023-01-27 NOTE — ED Provider Notes (Signed)
[facility]    CSN: [id number] Arrival date & time: 01/27/23  1343      History   Chief Complaint Chief Complaint  Patient presents with   Toe Pain    Entered by patient    HPI Landra Delorus Langwell is a 32 y.o. female.   Pati x-ray of the toe was normal ent presents with right second toe pain that started about 2 days ago.  Patient reports that she hit it on something prior to pain starting.  Denies numbness or tingling.  Has taken Tylenol for symptoms with minimal improvement.   Toe Pain    Past Medical History:  Diagnosis Date   Headache    PIH (pregnancy induced hypertension)    With first pregnancy in 2018   Pregnancy induced hypertension    gHTN; no meds   Vaginal Pap smear, abnormal     Patient Active Problem List   Diagnosis Date Noted   Encounter for elective induction of labor 12/25/2020   Normal postpartum course 12/25/2020   Acute blood loss anemia 12/25/2020   Depression 12/25/2020   S/P tubal ligation 12/25/2020   SVD (spontaneous vaginal delivery) 08/28/2019    Past Surgical History:  Procedure Laterality Date   BLADDER SURGERY     32 years old   DILATION AND EVACUATION N/A 07/21/2018   Procedure: SUCTION DILATATION AND EVACUATION;  Surgeon: Essie Hart, MD;  Location: [facility];  Service: Gynecology;  Laterality: N/A;   TUBAL LIGATION Bilateral 12/24/2020   Procedure: POST PARTUM TUBAL LIGATION;  Surgeon: Essie Hart, MD;  Location: [facility];  Service: Gynecology;  Laterality: Bilateral;    OB History     Gravida  4   Para  3   Term  3   Preterm  0   AB  1   Living  3      SAB  1   IAB  0   Ectopic  0   Multiple  0   Live Births  3            Home Medications    Prior to Admission medications   Medication Sig Start Date End Date Taking? Authorizing Provider  acetaminophen (TYLENOL) 500 MG tablet Take 1 tablet (500 mg total) by mouth every 6 (six) hours as needed. 03/05/19    Fayrene Helper, PA-C  ibuprofen (ADVIL) 600 MG tablet Take 1 tablet (600 mg total) by mouth every 6 (six) hours. 12/25/20   Dale [place], FNP  magic mouthwash (lidocaine, diphenhydrAMINE, alum & mag hydroxide) suspension Swish and spit 5 mLs 3 (three) times daily as needed for mouth pain. Swish and spit up to 3x daily while symptoms persist 07/04/21   Rhys Martini, PA-C  Prenatal Vit-Fe Fumarate-FA (MULTIVITAMIN-PRENATAL) 27-0.8 MG TABS tablet Take 1 tablet by mouth daily at 12 noon.    [provider]  sertraline (ZOLOFT) 50 MG tablet Take 1 tablet (50 mg total) by mouth daily. 12/25/20   Dale [place], FNP    Family History Family History  Problem Relation Age of Onset   Hypertension Father     Social History Social History   Tobacco Use   Smoking status: Never   Smokeless tobacco: Never  Vaping Use   Vaping Use: Never used  Substance Use Topics   Alcohol use: Never   Drug use: Never     Allergies   Patient has no known allergies.   Review of Systems Review of Systems Per  HPI  Physical Exam Triage Vital Signs ED Triage Vitals [01/27/23 1410]  Enc Vitals Group     BP 114/76     Pulse Rate 91     Resp 16     Temp 97.8 F (36.6 C)     Temp Source Oral     SpO2 99 %     Weight      Height      Head Circumference      Peak Flow      Pain Score 7     Pain Loc      Pain Edu?      Excl. in GC?    No data found.  Updated Vital Signs BP 114/76 (BP Location: Left Arm)   Pulse 91   Temp 97.8 F (36.6 C) (Oral)   Resp 16   SpO2 99%   Breastfeeding No   Visual Acuity Right Eye Distance:   Left Eye Distance:   Bilateral Distance:    Right Eye Near:   Left Eye Near:    Bilateral Near:     Physical Exam Constitutional:      General: She is not in acute distress.    Appearance: Normal appearance. She is not toxic-appearing or diaphoretic.  HENT:     Head: Normocephalic and atraumatic.  Eyes:     Extraocular Movements: Extraocular movements  intact.     Conjunctiva/sclera: Conjunctivae normal.  Pulmonary:     Effort: Pulmonary effort is normal.  Feet:     Comments: Patient has minimal redness and swelling present to the distal end of the right second toe.  No lacerations or abrasions noted.  Nail is intact.  Capillary refill and pulses intact.  Patient can wiggle toe. Neurological:     General: No focal deficit present.     Mental Status: She is alert and oriented to person, place, and time. Mental status is at baseline.  Psychiatric:        Mood and Affect: Mood normal.        Behavior: Behavior normal.        Thought Content: Thought content normal.        Judgment: Judgment normal.      UC Treatments / Results  Labs (all labs ordered are listed, but only abnormal results are displayed) Labs Reviewed - No data to display  EKG   Radiology DG Foot Complete Right  Result Date: 01/27/2023 CLINICAL DATA:  Pain EXAM: RIGHT FOOT COMPLETE - 4 VIEW COMPARISON:  Right foot x-ray 08/25/2020 FINDINGS: No fracture or dislocation. Preserved joint spaces and bone mineralization. IMPRESSION: No acute osseous abnormality Electronically Signed   By: Karen Kays M.D.   On: 01/27/2023 14:32    Procedures Procedures (including critical care time)  Medications Ordered in UC Medications - No data to display  Initial Impression / Assessment and Plan / UC Course  I have reviewed the triage vital signs and the nursing notes.  Pertinent labs & imaging results that were available during my care of the patient were reviewed by me and considered in my medical decision making (see chart for details).     X-ray was negative for any acute bony abnormality.  Suspect contusion related to mechanism of injury.  No signs of bacterial infection or worrisome etiology on exam.  Advised supportive care and over-the-counter pain relievers.  Advised following up with provided contact information for podiatry if symptoms persist or worsen.  Patient  verbalized understanding and was agreeable with  plan. Final Clinical Impressions(s) / UC Diagnoses   Final diagnoses:  Pain of toe of right foot     Discharge Instructions      X-ray was normal.  Suspect bruising and inflammation.  Follow-up with podiatry if symptoms persist or worsen.     ED Prescriptions   None    PDMP not reviewed this encounter.   Gustavus Bryant, [place] 01/27/23 [phone]

## 2023-11-11 ENCOUNTER — Emergency Department (HOSPITAL_COMMUNITY): Payer: Medicaid Other

## 2023-11-11 ENCOUNTER — Other Ambulatory Visit: Payer: Self-pay

## 2023-11-11 ENCOUNTER — Encounter (HOSPITAL_COMMUNITY): Payer: Self-pay

## 2023-11-11 ENCOUNTER — Emergency Department (HOSPITAL_COMMUNITY)
Admission: EM | Admit: 2023-11-11 | Discharge: 2023-11-11 | Disposition: A | Discharge status: Home / Self Care | Payer: Medicaid Other | Attending: Emergency Medicine | Admitting: Emergency Medicine

## 2023-11-11 DIAGNOSIS — M25512 Pain in left shoulder: Secondary | ICD-10-CM | POA: Insufficient documentation

## 2023-11-11 DIAGNOSIS — M25522 Pain in left elbow: Secondary | ICD-10-CM | POA: Insufficient documentation

## 2023-11-11 DIAGNOSIS — Y92481 Parking lot as the place of occurrence of the external cause: Secondary | ICD-10-CM | POA: Diagnosis not present

## 2023-11-11 DIAGNOSIS — M79605 Pain in left leg: Secondary | ICD-10-CM | POA: Diagnosis not present

## 2023-11-11 NOTE — ED Notes (Signed)
Pt stated she need to leave to pick up her children RN notified.

## 2023-11-11 NOTE — ED Notes (Signed)
 Pt to Xray.
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
## 2023-11-11 NOTE — ED Notes (Signed)
Pt eloped from department.

## 2024-04-01 ENCOUNTER — Ambulatory Visit (INDEPENDENT_AMBULATORY_CARE_PROVIDER_SITE_OTHER)

## 2024-04-01 ENCOUNTER — Telehealth: Payer: Self-pay

## 2024-04-01 ENCOUNTER — Ambulatory Visit (INDEPENDENT_AMBULATORY_CARE_PROVIDER_SITE_OTHER): Admitting: Podiatry

## 2024-04-01 ENCOUNTER — Encounter: Payer: Self-pay | Admitting: Podiatry

## 2024-04-01 DIAGNOSIS — M722 Plantar fascial fibromatosis: Secondary | ICD-10-CM

## 2024-04-01 MED ORDER — TRIAMCINOLONE ACETONIDE 10 MG/ML IJ SUSP
2.5000 mg | Freq: Once | INTRAMUSCULAR | Status: AC
  Administered 2024-04-01: 2.5 mg via INTRA_ARTICULAR

## 2024-04-01 MED ORDER — DEXAMETHASONE SODIUM PHOSPHATE 120 MG/30ML IJ SOLN
4.0000 mg | Freq: Once | INTRAMUSCULAR | Status: AC
  Administered 2024-04-01: 4 mg via INTRA_ARTICULAR

## 2024-04-01 MED ORDER — DICLOFENAC SODIUM 75 MG PO TBEC: 75.0000 mg | DELAYED_RELEASE_TABLET | Freq: Two times a day (BID) | ORAL | 0 refills | Status: DC

## 2024-04-01 NOTE — Telephone Encounter (Signed)
 PA was approved 04/01/24-04/01/25

## 2024-04-01 NOTE — Progress Notes (Signed)
  Subjective:  Patient ID: Heather Riggs, female    DOB: 09/28/1991,   MRN: [id number]  Chief Complaint  Patient presents with   Plantar Fasciitis    Rm22/ Plantar fasciitis left heel 4 months / prednisone last week/    33 y.o. female presents for concern of left heel pain that has been ongoing for about 4 months. Relates first steps in the mornign have been very painful and after being on fee all day. She was given meloxicam and prednisone and these did not make much difference for her.   . Denies any other pedal complaints. Denies n/v/f/c.   Past Medical History:  Diagnosis Date   Headache    PIH (pregnancy induced hypertension)    With first pregnancy in 2018   Pregnancy induced hypertension    gHTN; no meds   Vaginal Pap smear, abnormal     Objective:  Physical Exam: Vascular: DP/PT pulses 2/4 bilateral. CFT <3 seconds. Normal hair growth on digits. No edema.  Skin. No lacerations or abrasions bilateral feet.  Musculoskeletal: MMT 5/5 bilateral lower extremities in DF, PF, Inversion and Eversion. Deceased ROM in DF of ankle joint. Tender to the medial calcaneal tubercle left . No pain with achilles, PT or arch. No pain with calcaneal squeeze.  Neurological: Sensation intact to light touch.   Assessment:   1. Plantar fasciitis of left foot      Plan:  Patient was evaluated and treated and all questions answered. Discussed plantar fasciitis with patient.  X-rays reviewed and discussed with patient. No acute fractures or dislocations noted. Mild spurring noted at inferior calcaneus.  Discussed treatment options including, ice, NSAIDS, supportive shoes, bracing, and stretching. Stretching exercises provided to be done on a daily basis.   Prescription for diclofenac provided and sent to pharmacy. Previous CMP reviewed and no issues with kidney function.  Patient requesting injection today. Procedure note below.   PF brace dipsensed.  Follow-up 6 weeks or sooner  if any problems arise. In the meantime, encouraged to call the office with any questions, concerns, change in symptoms.   Procedure:  Discussed etiology, pathology, conservative vs. surgical therapies. At this time a plantar fascial injection was recommended.  The patient agreed and a sterile skin prep was applied.  An injection consisting of  1cc dexamethasone  0.5 cc kenalog and 1cc marcaine  mixture was infiltrated at the point of maximal tenderness on the left Heel.  Bandaid applied. The patient tolerated this well and was given instructions for aftercare.    [place], DPM

## 2024-04-01 NOTE — Telephone Encounter (Signed)
 PA request received from [employer]eds for Diclofenac 75 mg tablet. PA submitted and waiting on response.  Heather Riggs  (Key: [id number]) Rx #: [id number]

## 2024-04-01 NOTE — Patient Instructions (Signed)

## 2024-04-24 ENCOUNTER — Ambulatory Visit: Admitting: Diagnostic Neuroimaging

## 2024-04-27 ENCOUNTER — Encounter: Payer: Self-pay | Admitting: Neurology

## 2024-04-27 ENCOUNTER — Ambulatory Visit: Admitting: Neurology

## 2024-04-27 VITALS — BP 98/65 | HR 75 | Ht 72.0 in | Wt 229.6 lb

## 2024-04-27 DIAGNOSIS — R51 Headache with orthostatic component, not elsewhere classified: Secondary | ICD-10-CM | POA: Diagnosis not present

## 2024-04-27 DIAGNOSIS — G43E01 Chronic migraine with aura, not intractable, with status migrainosus: Secondary | ICD-10-CM | POA: Diagnosis not present

## 2024-04-27 DIAGNOSIS — R519 Headache, unspecified: Secondary | ICD-10-CM

## 2024-04-27 DIAGNOSIS — G43009 Migraine without aura, not intractable, without status migrainosus: Secondary | ICD-10-CM

## 2024-04-27 DIAGNOSIS — H539 Unspecified visual disturbance: Secondary | ICD-10-CM

## 2024-04-27 MED ORDER — EMGALITY 120 MG/ML ~~LOC~~ SOAJ: SUBCUTANEOUS | 0 refills | Status: AC

## 2024-04-27 NOTE — Patient Instructions (Addendum)
 Start Emgality  prevention:  first month dose inject two. Every month thereafter inject once monthly.  Ubrelvy samples but when you are improved can mychart me and I will update your note to say < 8 total headache/migraine days a month and we can prescribe ubrelvy MRi of the brain w/o contrast Urine pregnancy test  Discussed:  There is increased risk for stroke in women with migraine with aura and a contraindication for the combined contraceptive pill for use by women who have migraine with aura. The risk for women with migraine without aura is lower. However other risk factors like smoking are far more likely to increase stroke risk than migraine. There is a recommendation for no smoking and for the use of OCPs without estrogen such as progestogen only pills particularly for women with migraine with aura.SABRA People who have migraine headaches with auras may be 3 times more likely to have a stroke caused by a blood clot, compared to migraine patients who don't see auras. Women who take hormone-replacement therapy may be 30 percent more likely to suffer a clot-based stroke than women not taking medication containing estrogen. Other risk factors like smoking and high blood pressure may be  much more important. And stroke is still a rare complication due to migraine aura and is controversial and lower doses may not cause a risk.  Galcanezumab  Injection What is this medication? GALCANEZUMAB  (gal ka NEZ ue mab) prevents migraines. It works by blocking a substance in the body that causes migraines. It may also be used to treat cluster headaches. It is a monoclonal antibody. This medicine may be used for other purposes; ask your health care provider or pharmacist if you have questions. COMMON BRAND NAME(S): Emgality  What should I tell my care team before I take this medication? They need to know if you have any of these conditions: An unusual or allergic reaction to galcanezumab , other medications, foods, dyes,  or preservatives Pregnant or trying to get pregnant Breast-feeding How should I use this medication? This medication is injected under the skin. You will be taught how to prepare and give it. Take it as directed on the prescription label. Keep taking it unless your care team tells you to stop. It is important that you put your used needles and syringes in a special sharps container. Do not put them in a trash can. If you do not have a sharps container, call your pharmacist or care team to get one. Talk to your care team about the use of this medication in children. Special care may be needed. Overdosage: If you think you have taken too much of this medicine contact a poison control center or emergency room at once. NOTE: This medicine is only for you. Do not share this medicine with others. What if I miss a dose? If you miss a dose, take it as soon as you can. If it is almost time for your next dose, take only that dose. Do not take double or extra doses. What may interact with this medication? Interactions are not expected. This list may not describe all possible interactions. Give your health care provider a list of all the medicines, herbs, non-prescription drugs, or dietary supplements you use. Also tell them if you smoke, drink alcohol, or use illegal drugs. Some items may interact with your medicine. What should I watch for while using this medication? Visit your care team for regular checks on your progress. Tell your care team if your symptoms do not start to get  better or if they get worse. What side effects may I notice from receiving this medication? Side effects that you should report to your care team as soon as possible: Allergic reactions or angioedema--skin rash, itching or hives, swelling of the face, eyes, lips, tongue, arms, or legs, trouble swallowing or breathing Side effects that usually do not require medical attention (report to your care team if they continue or are  bothersome): Pain, redness, or irritation at injection site This list may not describe all possible side effects. Call your doctor for medical advice about side effects. You may report side effects to FDA at 1-800-FDA-1088. Where should I keep my medication? Keep out of the reach of children and pets. Store in a refrigerator or at room temperature between 20 and 25 degrees C (68 and 77 degrees F). Refrigeration (preferred): Store in the refrigerator. Do not freeze. Keep in the original container until you are ready to take it. Remove the dose from the carton about 30 minutes before it is time for you to use it. If the dose is not used, it may be stored in original container at room temperature for 7 days. Get rid of any unused medication after the expiration date. Room Temperature: This medication may be stored at room temperature for up to 7 days. Keep it in the original container. Protect from light until time of use. If it is stored at room temperature, get rid of any unused medication after 7 days or after it expires, whichever is first. To get rid of medications that are no longer needed or have expired: Take the medication to a medication take-back program. Check with your pharmacy or law enforcement to find a location. If you cannot return the medication, ask your pharmacist or care team how to get rid of this medication safely. NOTE: This sheet is a summary. It may not cover all possible information. If you have questions about this medicine, talk to your doctor, pharmacist, or health care provider.  2024 Elsevier/Gold Standard (2021-11-20 00:00:00)
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
## 2024-04-28 ENCOUNTER — Ambulatory Visit: Payer: Self-pay | Admitting: Neurology

## 2024-04-28 ENCOUNTER — Encounter: Payer: Self-pay | Admitting: Neurology

## 2024-04-28 LAB — PREGNANCY, URINE: Preg Test, Ur: NEGATIVE

## 2024-04-28 MED ORDER — EMGALITY 120 MG/ML ~~LOC~~ SOAJ
120.0000 mg | SUBCUTANEOUS | 11 refills | Status: AC
Start: 2024-04-28 — End: ?

## 2024-04-29 ENCOUNTER — Telehealth: Payer: Self-pay | Admitting: *Deleted

## 2024-04-29 NOTE — Telephone Encounter (Signed)
 Pt states an Emgality  PA is needed.

## 2024-04-30 ENCOUNTER — Telehealth: Payer: Self-pay

## 2024-04-30 ENCOUNTER — Other Ambulatory Visit (HOSPITAL_COMMUNITY): Payer: Self-pay

## 2024-04-30 NOTE — Telephone Encounter (Signed)
 Patient has been notified

## 2024-04-30 NOTE — Telephone Encounter (Signed)
 Thanks!  I notified the patient.

## 2024-04-30 NOTE — Telephone Encounter (Signed)
 Pharmacy Patient Advocate Encounter  Received notification from [facility] that Prior Authorization for Emgality  120MG /ML auto-injectors (migraine) has been APPROVED from 04/30/2024 to 07/29/2024. Ran test claim, Copay is $4.00. This test claim was processed through [facility]- copay amounts may vary at other pharmacies due to pharmacy/plan contracts, or as the patient moves through the different stages of their insurance plan.   PA #/Case ID/Reference #: N/A  [name]

## 2024-05-13 ENCOUNTER — Encounter: Payer: Self-pay | Admitting: Podiatry

## 2024-05-13 ENCOUNTER — Ambulatory Visit (INDEPENDENT_AMBULATORY_CARE_PROVIDER_SITE_OTHER): Admitting: Podiatry

## 2024-05-13 DIAGNOSIS — M722 Plantar fascial fibromatosis: Secondary | ICD-10-CM

## 2024-05-13 MED ORDER — DEXAMETHASONE SODIUM PHOSPHATE 120 MG/30ML IJ SOLN
4.0000 mg | Freq: Once | INTRAMUSCULAR | Status: AC
  Administered 2024-05-13: 4 mg via INTRA_ARTICULAR

## 2024-05-13 MED ORDER — TRIAMCINOLONE ACETONIDE 10 MG/ML IJ SUSP
2.5000 mg | Freq: Once | INTRAMUSCULAR | Status: AC
  Administered 2024-05-13: 2.5 mg via INTRA_ARTICULAR

## 2024-05-13 NOTE — Progress Notes (Signed)
  Subjective:  Patient ID: Heather Riggs, female    DOB: [birth year],   MRN: [id number]  Chief Complaint  Patient presents with   Plantar Fasciitis    It hurts.    33 y.o. female presents for follow-up of left plantar fasciiits. Does not relate any improvement and may be worse. Relates injection did not help and neither did diclofenac . She has been stretching and wearing brace. . Denies any other pedal complaints. Denies n/v/f/c.   Past Medical History:  Diagnosis Date   Headache    PIH (pregnancy induced hypertension)    With first pregnancy in 2018   Pregnancy induced hypertension    gHTN; no meds   Vaginal Pap smear, abnormal     Objective:  Physical Exam: Vascular: DP/PT pulses 2/4 bilateral. CFT <3 seconds. Normal hair growth on digits. No edema.  Skin. No lacerations or abrasions bilateral feet.  Musculoskeletal: MMT 5/5 bilateral lower extremities in DF, PF, Inversion and Eversion. Deceased ROM in DF of ankle joint. Tender to the medial calcaneal tubercle left . No pain with achilles, PT or arch. No pain with calcaneal squeeze.  Neurological: Sensation intact to light touch.   Assessment:   1. Plantar fasciitis of left foot       Plan:  Patient was evaluated and treated and all questions answered. Discussed plantar fasciitis with patient.  X-rays reviewed and discussed with patient. No acute fractures or dislocations noted. Mild spurring noted at inferior calcaneus.  Discussed treatment options including, ice, NSAIDS, supportive shoes, bracing, and stretching.  Continue brace and stretching.  Referral for PT sent.  Patient requesting injection today. Procedure note below.   Follow-up 6 weeks or sooner if any problems arise. In the meantime, encouraged to call the office with any questions, concerns, change in symptoms.   Procedure:  Discussed etiology, pathology, conservative vs. surgical therapies. At this time a plantar fascial injection was  recommended.  The patient agreed and a sterile skin prep was applied.  An injection consisting of  1cc dexamethasone  0.5 cc kenalog  and 1cc marcaine  mixture was infiltrated at the point of maximal tenderness on the left Heel.  Bandaid applied. The patient tolerated this well and was given instructions for aftercare.    Asberry Failing, DPM

## 2024-05-25 MED ORDER — UBRELVY 100 MG PO TABS: 100.0000 mg | ORAL_TABLET | ORAL | 11 refills | Status: AC | PRN

## 2024-05-25 NOTE — Addendum Note (Signed)
 Addended by: Purl Claytor B on: 05/25/2024 02:30 PM   Modules accepted: Orders

## 2024-05-28 ENCOUNTER — Ambulatory Visit: Attending: Podiatry

## 2024-05-28 ENCOUNTER — Other Ambulatory Visit: Payer: Self-pay

## 2024-05-28 DIAGNOSIS — M25672 Stiffness of left ankle, not elsewhere classified: Secondary | ICD-10-CM | POA: Diagnosis present

## 2024-05-28 DIAGNOSIS — M722 Plantar fascial fibromatosis: Secondary | ICD-10-CM | POA: Diagnosis not present

## 2024-05-28 DIAGNOSIS — M25572 Pain in left ankle and joints of left foot: Secondary | ICD-10-CM | POA: Insufficient documentation

## 2024-05-28 DIAGNOSIS — R262 Difficulty in walking, not elsewhere classified: Secondary | ICD-10-CM | POA: Diagnosis present

## 2024-05-28 NOTE — Therapy (Signed)
 OUTPATIENT PHYSICAL THERAPY LOWER EXTREMITY EVALUATION   Patient Name: Heather Riggs MRN: [id number] DOB:04/29/1991, 33 y.o., female Today's Date: 05/29/2024  END OF SESSION:  PT End of Session - 05/28/24 0857     Visit Number 1    Number of Visits 13    Date for PT Re-Evaluation 07/31/24    Authorization Type [place] MEDICAID HEALTHY BLUE    Authorization - Visit Number 1    PT Start Time 1050    PT Stop Time 0930    PT Time Calculation (min) 1360 min    Activity Tolerance Patient tolerated treatment well    Behavior During Therapy WFL for tasks assessed/performed          Past Medical History:  Diagnosis Date   Headache    PIH (pregnancy induced hypertension)    With first pregnancy in 2018   Pregnancy induced hypertension    gHTN; no meds   Vaginal Pap smear, abnormal    Past Surgical History:  Procedure Laterality Date   BLADDER SURGERY     33 years old   DILATION AND EVACUATION N/A 07/21/2018   Procedure: SUCTION DILATATION AND EVACUATION;  Surgeon: Bettina Muskrat, MD;  Location: [facility];  Service: Gynecology;  Laterality: N/A;   TUBAL LIGATION Bilateral 12/24/2020   Procedure: POST PARTUM TUBAL LIGATION;  Surgeon: Bettina Muskrat, MD;  Location: [facility];  Service: Gynecology;  Laterality: Bilateral;   Patient Active Problem List   Diagnosis Date Noted   Chronic migraine with aura and with status migrainosus, not intractable 04/27/2024   Encounter for elective induction of labor 12/25/2020   Normal postpartum course 12/25/2020   Acute blood loss anemia 12/25/2020   Depression 12/25/2020   S/P tubal ligation 12/25/2020   SVD (spontaneous vaginal delivery) 08/28/2019    PCP: Rosalea Rosina SAILOR, PA   REFERRING PROVIDER: Joya Stabs, DPM   REFERRING DIAG: M72.2 (ICD-10-CM) - Plantar fasciitis of left foot   THERAPY DIAG:  Pain in left ankle and joints of left foot  Stiffness of left ankle, not elsewhere  classified  Difficulty in walking, not elsewhere classified  Rationale for Evaluation and Treatment: Rehabilitation  ONSET DATE: 3 to 4 months  SUBJECTIVE:   SUBJECTIVE STATEMENT: 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  PERTINENT HISTORY: High BMI  PAIN:  Are you having pain? Yes: NPRS scale: 6/10. Pain range the last week: 5-9/10 Pain location: medial plantar heel Pain description: throb, sharp Aggravating factors: increased walking, 1st steps rest Relieving factors: Elevated, hot packs  PRECAUTIONS: None  RED FLAGS: None   WEIGHT BEARING RESTRICTIONS: No  FALLS:  Has patient fallen in last 6 months? Yes. Number of falls 0  LIVING ENVIRONMENT: Lives with: lives with their family Lives in: House/apartment Able to access home  OCCUPATION: Location manager, lifting, a lot of walking  PLOF: Independent  PATIENT GOALS: New ways to help with decrease the pain  NEXT MD VISIT: October  OBJECTIVE:  Note: Objective measures were completed at Evaluation unless otherwise noted.  DIAGNOSTIC FINDINGS: X-rays reviewed and discussed with patient. No acute fractures or dislocations noted. Mild spurring noted at inferior calcaneus.   PATIENT SURVEYS:  LEFS : 43/80= 54%  COGNITION: Overall cognitive status: Within functional limits for tasks assessed     SENSATION: WFL  EDEMA:  Pt reporrts swelling at times  POSTURE: flexed trunk , minimal pes planus  PALPATION: TTP medial plantar heel  LOWER EXTREMITY ROM:  WNLS Active ROM Right eval Left eval  Hip flexion    Hip extension    Hip abduction    Hip adduction    Hip internal rotation    Hip external rotation    Knee flexion    Knee extension    Ankle dorsiflexion 10 0 P  Ankle plantarflexion  P  Ankle inversion    Ankle eversion  P   (Blank rows = not tested)  LOWER EXTREMITY MMT:  MMT Right eval Left eval  Hip flexion    Hip extension     Hip abduction    Hip adduction    Hip internal rotation    Hip external rotation    Knee flexion    Knee extension    Ankle dorsiflexion 5 5  Ankle plantarflexion 5 5p  Ankle inversion 5 5  Ankle eversion 5 5p   (Blank rows = not tested)  FUNCTIONAL TESTS:  2 minute walk test: TBA  GAIT: Distance walked: 200' Assistive device utilized: None Level of assistance: Complete Independence Comments: WNLs                                                                                                                              TREATMENT DATE:  [facility] Adult PT Treatment:                                                DATE: 05/28/24 Therapeutic Exercise: Developed, instructed in, and pt completed therex as noted in HEP  Self Care: Stretch daily, resistance exs every other day   PATIENT EDUCATION:  Education details: Eval findings, POC, HEP, self care  Person educated: Patient Education method: Explanation, Demonstration, Tactile cues, Verbal cues, and Handouts Education comprehension: verbalized understanding, returned demonstration, verbal cues required, and tactile cues required  HOME EXERCISE PROGRAM: Access Code: [email or web address] URL: [email or web address] Date: 05/28/2024 Prepared by: Dasie Daft  Exercises - Long Sitting Calf Stretch with Strap  - 1 x daily - 7 x weekly - 1 sets - 3 reps - 60 hold - Gastroc Stretch on Wall  - 1 x daily - 7 x weekly - 1 sets - 3 reps - 60 hold - Heel Raises with Counter Support  - 1 x daily - 7 x weekly - 2 sets - 8 reps - 10 hold  ASSESSMENT:  CLINICAL IMPRESSION: Patient is a 33 y.o. female who was seen today for physical therapy evaluation and treatment for M72.2 (ICD-10-CM) - Plantar fasciitis of left foot. Pt presents with signs and symptoms consistent with the referring Dx. L ankle DF is decreased in comparison to the R. Pt will benefit from skilled PT 2w6 to address impairments to optimize L foot function with less pain.    OBJECTIVE IMPAIRMENTS: decreased activity tolerance, difficulty walking, decreased ROM,  pain, and high BMI.   ACTIVITY LIMITATIONS: carrying, lifting, squatting, stairs, and locomotion level  PARTICIPATION LIMITATIONS: cleaning, shopping, community activity, and occupation  PERSONAL FACTORS: Past/current experiences and 1 comorbidity: high BMI are also affecting patient's functional outcome.   REHAB POTENTIAL: Good  CLINICAL DECISION MAKING: Evolving/moderate complexity  EVALUATION COMPLEXITY: Moderate   GOALS:  SHORT TERM GOALS = LTGs  LONG TERM GOALS: Target date: 07/31/24  Pt will be Ind in a final HEP to maintain achieved LOF  Baseline: started Goal status: INITIAL  2.  Pt will report 75% or greater improvement in her L foot pain with daily and work activities  Baseline: 5-9/10 Goal status: INITIAL  3.  Increase L ankle DF AROM to 10d to minimize strain of the plantar fascia Baseline: 0d Goal status: INITIAL  4.  Pt's LEFS score witll improve by the MCID to 69% or greater as indication of improved function  Baseline: 54% Goal status: INITIAL  5.  Improve by MCID of 59ft as indication of improved functional mobility  Baseline: TBA Goal status: INITIAL  PLAN:  PT FREQUENCY: 2x/week  PT DURATION: 6 weeks  PLANNED INTERVENTIONS: 97164- PT Re-evaluation, 97110-Therapeutic exercises, 97530- Therapeutic activity, 97112- Neuromuscular re-education, 97535- Self Care, 02859- Manual therapy, 97116- Gait training, Balance training, Stair training, Taping, Joint mobilization, Cryotherapy, and Moist heat  PLAN FOR NEXT SESSION: Assess ; assess response to HEP; progress therex as indicated; use of modalities, manual therapy; and TPDN as indicated.   Yanira Tolsma MS, PT 05/29/24 3:16 PM  For all possible CPT codes, reference the Planned Interventions line above.     Check all conditions that are expected to impact treatment: {Conditions expected to impact  treatment:Musculoskeletal disorders   If treatment provided at initial evaluation, no treatment charged due to lack of authorization.

## 2024-05-29 ENCOUNTER — Telehealth: Payer: Self-pay | Admitting: *Deleted

## 2024-05-29 ENCOUNTER — Other Ambulatory Visit (HOSPITAL_COMMUNITY): Payer: Self-pay

## 2024-05-29 NOTE — Telephone Encounter (Signed)
 Pharmacy Patient Advocate Encounter  Received notification from [facility] that Prior Authorization for Ubrelvy  100mg  Tablet has been APPROVED from 05/29/2024 to 05/29/2025   PA #/Case ID/Reference #: [id number]  KEY: [id number]

## 2024-05-29 NOTE — Telephone Encounter (Signed)
Bernita Raisin PA needed.

## 2024-06-03 ENCOUNTER — Ambulatory Visit: Payer: Self-pay | Admitting: Physical Therapy

## 2024-06-09 NOTE — Therapy (Signed)
 OUTPATIENT PHYSICAL THERAPY LOWER EXTREMITY TREATMENT   Patient Name: Heather Riggs MRN: [id number] DOB:October 10, 1990, 33 y.o., female Today's Date: 06/11/2024  END OF SESSION:  PT End of Session - 06/10/24 0852     Visit Number 2    Number of Visits 13    Date for PT Re-Evaluation 07/31/24    Authorization Type [place] MEDICAID HEALTHY BLUE    Authorization - Visit Number 2    Authorization - Number of Visits 7    PT Start Time [id number]    PT Stop Time 0930    PT Time Calculation (min) 44 min    Activity Tolerance Patient tolerated treatment well    Behavior During Therapy WFL for tasks assessed/performed           Past Medical History:  Diagnosis Date   Headache    PIH (pregnancy induced hypertension)    With first pregnancy in 2018   Pregnancy induced hypertension    gHTN; no meds   Vaginal Pap smear, abnormal    Past Surgical History:  Procedure Laterality Date   BLADDER SURGERY     33 years old   DILATION AND EVACUATION N/A 07/21/2018   Procedure: SUCTION DILATATION AND EVACUATION;  Surgeon: Bettina Muskrat, MD;  Location: [facility];  Service: Gynecology;  Laterality: N/A;   TUBAL LIGATION Bilateral 12/24/2020   Procedure: POST PARTUM TUBAL LIGATION;  Surgeon: Bettina Muskrat, MD;  Location: [facility];  Service: Gynecology;  Laterality: Bilateral;   Patient Active Problem List   Diagnosis Date Noted   Chronic migraine with aura and with status migrainosus, not intractable 04/27/2024   Encounter for elective induction of labor 12/25/2020   Normal postpartum course 12/25/2020   Acute blood loss anemia 12/25/2020   Depression 12/25/2020   S/P tubal ligation 12/25/2020   SVD (spontaneous vaginal delivery) 08/28/2019    PCP: Rosalea Rosina SAILOR, PA   REFERRING PROVIDER: Joya Stabs, DPM   REFERRING DIAG: M72.2 (ICD-10-CM) - Plantar fasciitis of left foot   THERAPY DIAG:  Pain in left ankle and joints of left foot  Stiffness of left ankle,  not elsewhere classified  Difficulty in walking, not elsewhere classified  Rationale for Evaluation and Treatment: Rehabilitation  ONSET DATE: 3 to 4 months  SUBJECTIVE:   SUBJECTIVE STATEMENT: Pt reports her L foot pain is about the same. She reports being consistent with her HEP. Her foot will feels better after exs, but the pain increases throughout the shift at work. Pt is required to wear steel toed shoes at work.  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  PERTINENT HISTORY: High BMI  PAIN:  Are you having pain? Yes: NPRS scale: 9/10, worked last Copy. Pain range the last week: 5-9/10 Pain location: medial plantar heel Pain description: throb, sharp Aggravating factors: increased walking, 1st steps rest Relieving factors: Elevated, hot packs  PRECAUTIONS: None  RED FLAGS: None   WEIGHT BEARING RESTRICTIONS: No  FALLS:  Has patient fallen in last 6 months? Yes. Number of falls 0  LIVING ENVIRONMENT: Lives with: lives with their family Lives in: House/apartment Able to access home  OCCUPATION: Location manager, lifting, a lot of walking  PLOF: Independent  PATIENT GOALS: New ways to help with decrease the pain  NEXT MD VISIT: October  OBJECTIVE:  Note: Objective measures were completed at Evaluation unless otherwise noted.  DIAGNOSTIC FINDINGS: X-rays reviewed and discussed with patient. No acute  fractures or dislocations noted. Mild spurring noted at inferior calcaneus.   PATIENT SURVEYS:  LEFS : 43/80= 54%  COGNITION: Overall cognitive status: Within functional limits for tasks assessed     SENSATION: WFL  EDEMA:  Pt reporrts swelling at times  POSTURE: flexed trunk , minimal pes planus  PALPATION: TTP medial plantar heel  LOWER EXTREMITY ROM:  WNLS Active ROM Right eval Left eval  Hip flexion    Hip extension    Hip abduction    Hip adduction    Hip internal rotation     Hip external rotation    Knee flexion    Knee extension    Ankle dorsiflexion 10 0 P  Ankle plantarflexion  P  Ankle inversion    Ankle eversion  P   (Blank rows = not tested)  LOWER EXTREMITY MMT:  MMT Right eval Left eval  Hip flexion    Hip extension    Hip abduction    Hip adduction    Hip internal rotation    Hip external rotation    Knee flexion    Knee extension    Ankle dorsiflexion 5 5  Ankle plantarflexion 5 5p  Ankle inversion 5 5  Ankle eversion 5 5p   (Blank rows = not tested)  FUNCTIONAL TESTS:  2 minute walk test: TBA  GAIT: Distance walked: 200' Assistive device utilized: None Level of assistance: Complete Independence Comments: WNLs                                                                                                                              TREATMENT DATE:  [facility] Adult PT Treatment:                                                DATE: 06/10/24 Therapeutic Exercise: Towel scrunches 2x20 Slant board stretch 2x 60 Pillow case DF foot long sit stretch 2x 60% Heel Raises with Counter Support 2x8 10 hold, slow eccentric return Manual Therapy: STM to the R plantar foot c splaying and also to the posterior calf muscles Self Care: Tennis ball massage with pt completing, returning demonstration Referral recommendation to [employer] for shoe or orthotic insole recommendations   [facility] Adult PT Treatment:                                                DATE: 05/28/24 Therapeutic Exercise: Developed, instructed in, and pt completed therex as noted in HEP  Self Care: Stretch daily, resistance exs every other day   PATIENT EDUCATION:  Education details: Eval findings, POC, HEP, self care  Person educated: Patient Education method: Explanation, Demonstration, Tactile cues, Verbal cues, and Handouts Education  comprehension: verbalized understanding, returned demonstration, verbal cues required, and tactile cues required  HOME EXERCISE  PROGRAM: Access Code: [email or web address] URL: [email or web address] Date: 06/10/2024 Prepared by: Dasie Daft  Exercises - Long Sitting Calf Stretch with Strap  - 1 x daily - 7 x weekly - 1 sets - 3 reps - 60 hold - Gastroc Stretch on Wall  - 1 x daily - 7 x weekly - 1 sets - 3 reps - 60 hold - Heel Raises with Counter Support  - 1 x daily - 7 x weekly - 2 sets - 8 reps - 10 hold - Seated Toe Towel Scrunches  - 1 x daily - 7 x weekly - 2 sets - 20 reps - 2 hold  ASSESSMENT:  CLINICAL IMPRESSION: PT was  for completed therapy as noted above. Exs for flexibility and strengthening to promote tissue remodeling. Provided Pt ED for tennis ball massage and made recommendations for re: shoes and orthotic shoe insoles. Heel raises were limited to 2 sets of 8 reps with pt reporting an increase plantar heel pain. Pt will continue to benefit from skilled PT to address impairments for improved function with less pain.  EVAL: Patient is a 33 y.o. female who was seen today for physical therapy evaluation and treatment for M72.2 (ICD-10-CM) - Plantar fasciitis of left foot. Pt presents with signs and symptoms consistent with the referring Dx. L ankle DF is decreased in comparison to the R. Pt will benefit from skilled PT 2w6 to address impairments to optimize L foot function with less pain.   OBJECTIVE IMPAIRMENTS: decreased activity tolerance, difficulty walking, decreased ROM, pain, and high BMI.   ACTIVITY LIMITATIONS: carrying, lifting, squatting, stairs, and locomotion level  PARTICIPATION LIMITATIONS: cleaning, shopping, community activity, and occupation  PERSONAL FACTORS: Past/current experiences and 1 comorbidity: high BMI are also affecting patient's functional outcome.   REHAB POTENTIAL: Good  CLINICAL DECISION MAKING: Evolving/moderate complexity  EVALUATION COMPLEXITY: Moderate   GOALS:  SHORT TERM GOALS = LTGs  LONG TERM GOALS: Target date: 07/31/24  Pt will be Ind in a  final HEP to maintain achieved LOF  Baseline: started Goal status: INITIAL  2.  Pt will report 75% or greater improvement in her L foot pain with daily and work activities  Baseline: 5-9/10 Goal status: INITIAL  3.  Increase L ankle DF AROM to 10d to minimize strain of the plantar fascia Baseline: 0d Goal status: INITIAL  4.  Pt's LEFS score witll improve by the MCID to 69% or greater as indication of improved function  Baseline: 54% Goal status: INITIAL  5.  Improve by MCID of 36ft as indication of improved functional mobility  Baseline: TBA Goal status: INITIAL  PLAN:  PT FREQUENCY: 2x/week  PT DURATION: 6 weeks  PLANNED INTERVENTIONS: 97164- PT Re-evaluation, 97110-Therapeutic exercises, 97530- Therapeutic activity, 97112- Neuromuscular re-education, 97535- Self Care, 02859- Manual therapy, 97116- Gait training, Balance training, Stair training, Taping, Joint mobilization, Cryotherapy, and Moist heat  PLAN FOR NEXT SESSION: Assess ; assess response to HEP; progress therex as indicated; use of modalities, manual therapy; and TPDN as indicated.   Adalind Weitz MS, PT 06/11/24 5:17 AM

## 2024-06-10 ENCOUNTER — Ambulatory Visit: Attending: Podiatry

## 2024-06-10 DIAGNOSIS — M25672 Stiffness of left ankle, not elsewhere classified: Secondary | ICD-10-CM | POA: Diagnosis present

## 2024-06-10 DIAGNOSIS — M25572 Pain in left ankle and joints of left foot: Secondary | ICD-10-CM | POA: Insufficient documentation

## 2024-06-10 DIAGNOSIS — R262 Difficulty in walking, not elsewhere classified: Secondary | ICD-10-CM | POA: Diagnosis present

## 2024-06-12 ENCOUNTER — Encounter

## 2024-06-16 ENCOUNTER — Ambulatory Visit: Admitting: Physical Therapy

## 2024-06-17 NOTE — Therapy (Incomplete)
 OUTPATIENT PHYSICAL THERAPY LOWER EXTREMITY TREATMENT   Patient Name: Heather Riggs MRN: [id number] DOB:[birth year], 33 y.o., female Today's Date: 06/17/2024  END OF SESSION:     Past Medical History:  Diagnosis Date   Headache    PIH (pregnancy induced hypertension)    With first pregnancy in 2018   Pregnancy induced hypertension    gHTN; no meds   Vaginal Pap smear, abnormal    Past Surgical History:  Procedure Laterality Date   BLADDER SURGERY     33 years old   DILATION AND EVACUATION N/A 07/21/2018   Procedure: SUCTION DILATATION AND EVACUATION;  Surgeon: Bettina Muskrat, MD;  Location: [facility];  Service: Gynecology;  Laterality: N/A;   TUBAL LIGATION Bilateral 12/24/2020   Procedure: POST PARTUM TUBAL LIGATION;  Surgeon: Bettina Muskrat, MD;  Location: [facility];  Service: Gynecology;  Laterality: Bilateral;   Patient Active Problem List   Diagnosis Date Noted   Chronic migraine with aura and with status migrainosus, not intractable 04/27/2024   Encounter for elective induction of labor 12/25/2020   Normal postpartum course 12/25/2020   Acute blood loss anemia 12/25/2020   Depression 12/25/2020   S/P tubal ligation 12/25/2020   SVD (spontaneous vaginal delivery) 08/28/2019    PCP: Rosalea Rosina SAILOR, PA   REFERRING PROVIDER: Joya Stabs, DPM   REFERRING DIAG: M72.2 (ICD-10-CM) - Plantar fasciitis of left foot   THERAPY DIAG:  No diagnosis found.  Rationale for Evaluation and Treatment: Rehabilitation  ONSET DATE: 3 to 4 months  SUBJECTIVE:   SUBJECTIVE STATEMENT: Pt reports her L foot pain is about the same. She reports being consistent with her HEP. Her foot will feels better after exs, but the pain increases throughout the shift at work. Pt is required to wear steel toed shoes at work.  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  PERTINENT HISTORY: High  BMI  PAIN:  Are you having pain? Yes: NPRS scale: 9/10, worked last Copy. Pain range the last week: 5-9/10 Pain location: medial plantar heel Pain description: throb, sharp Aggravating factors: increased walking, 1st steps rest Relieving factors: Elevated, hot packs  PRECAUTIONS: None  RED FLAGS: None   WEIGHT BEARING RESTRICTIONS: No  FALLS:  Has patient fallen in last 6 months? Yes. Number of falls 0  LIVING ENVIRONMENT: Lives with: lives with their family Lives in: House/apartment Able to access home  OCCUPATION: Location manager, lifting, a lot of walking  PLOF: Independent  PATIENT GOALS: New ways to help with decrease the pain  NEXT MD VISIT: October  OBJECTIVE:  Note: Objective measures were completed at Evaluation unless otherwise noted.  DIAGNOSTIC FINDINGS: X-rays reviewed and discussed with patient. No acute fractures or dislocations noted. Mild spurring noted at inferior calcaneus.   PATIENT SURVEYS:  LEFS : 43/80= 54%  COGNITION: Overall cognitive status: Within functional limits for tasks assessed     SENSATION: WFL  EDEMA:  Pt reporrts swelling at times  POSTURE: flexed trunk , minimal pes planus  PALPATION: TTP medial plantar heel  LOWER EXTREMITY ROM:  WNLS Active ROM Right eval Left eval  Hip flexion    Hip extension    Hip abduction    Hip adduction    Hip internal rotation    Hip external rotation    Knee flexion    Knee extension    Ankle dorsiflexion 10 0 P  Ankle plantarflexion  P  Ankle  inversion    Ankle eversion  P   (Blank rows = not tested)  LOWER EXTREMITY MMT:  MMT Right eval Left eval  Hip flexion    Hip extension    Hip abduction    Hip adduction    Hip internal rotation    Hip external rotation    Knee flexion    Knee extension    Ankle dorsiflexion 5 5  Ankle plantarflexion 5 5p  Ankle inversion 5 5  Ankle eversion 5 5p   (Blank rows = not tested)  FUNCTIONAL TESTS:  2 minute walk test:  TBA  GAIT: Distance walked: 200' Assistive device utilized: None Level of assistance: Complete Independence Comments: WNLs                                                                                                                              TREATMENT DATE:  [facility] Adult PT Treatment:                                                DATE: 06/18/24 Therapeutic Exercise: Towel scrunches 2x20 Slant board stretch 2x 60 Pillow case DF foot long sit stretch 2x 60% Heel Raises with Counter Support 2x8 10 hold, slow eccentric return Manual Therapy: STM to the R plantar foot c splaying and also to the posterior calf muscles Self Care: Tennis ball massage with pt completing, returning demonstration Referral recommendation to [employer] for shoe or orthotic insole recommendations   [facility] Adult PT Treatment:                                                DATE: 06/10/24 Therapeutic Exercise: Towel scrunches 2x20 Slant board stretch 2x 60 Pillow case DF foot long sit stretch 2x 60% Heel Raises with Counter Support 2x8 10 hold, slow eccentric return Manual Therapy: STM to the R plantar foot c splaying and also to the posterior calf muscles Self Care: Tennis ball massage with pt completing, returning demonstration Referral recommendation to [employer] for shoe or orthotic insole recommendations   [facility] Adult PT Treatment:                                                DATE: 05/28/24 Therapeutic Exercise: Developed, instructed in, and pt completed therex as noted in HEP  Self Care: Stretch daily, resistance exs every other day   PATIENT EDUCATION:  Education details: Eval findings, POC, HEP, self care  Person educated: Patient Education method: Explanation, Demonstration, Tactile cues, Verbal cues, and Handouts Education comprehension: verbalized  understanding, returned demonstration, verbal cues required, and tactile cues required  HOME EXERCISE PROGRAM: Access Code: [email or web address] URL:  [email or web address] Date: 06/10/2024 Prepared by: Dasie Daft  Exercises - Long Sitting Calf Stretch with Strap  - 1 x daily - 7 x weekly - 1 sets - 3 reps - 60 hold - Gastroc Stretch on Wall  - 1 x daily - 7 x weekly - 1 sets - 3 reps - 60 hold - Heel Raises with Counter Support  - 1 x daily - 7 x weekly - 2 sets - 8 reps - 10 hold - Seated Toe Towel Scrunches  - 1 x daily - 7 x weekly - 2 sets - 20 reps - 2 hold  ASSESSMENT:  CLINICAL IMPRESSION: PT was  for completed therapy as noted above. Exs for flexibility and strengthening to promote tissue remodeling. Provided Pt ED for tennis ball massage and made recommendations for re: shoes and orthotic shoe insoles. Heel raises were limited to 2 sets of 8 reps with pt reporting an increase plantar heel pain. Pt will continue to benefit from skilled PT to address impairments for improved function with less pain.  EVAL: Patient is a 33 y.o. female who was seen today for physical therapy evaluation and treatment for M72.2 (ICD-10-CM) - Plantar fasciitis of left foot. Pt presents with signs and symptoms consistent with the referring Dx. L ankle DF is decreased in comparison to the R. Pt will benefit from skilled PT 2w6 to address impairments to optimize L foot function with less pain.   OBJECTIVE IMPAIRMENTS: decreased activity tolerance, difficulty walking, decreased ROM, pain, and high BMI.   ACTIVITY LIMITATIONS: carrying, lifting, squatting, stairs, and locomotion level  PARTICIPATION LIMITATIONS: cleaning, shopping, community activity, and occupation  PERSONAL FACTORS: Past/current experiences and 1 comorbidity: high BMI are also affecting patient's functional outcome.   REHAB POTENTIAL: Good  CLINICAL DECISION MAKING: Evolving/moderate complexity  EVALUATION COMPLEXITY: Moderate   GOALS:  SHORT TERM GOALS = LTGs  LONG TERM GOALS: Target date: 07/31/24  Pt will be Ind in a final HEP to maintain achieved LOF   Baseline: started Goal status: INITIAL  2.  Pt will report 75% or greater improvement in her L foot pain with daily and work activities  Baseline: 5-9/10 Goal status: INITIAL  3.  Increase L ankle DF AROM to 10d to minimize strain of the plantar fascia Baseline: 0d Goal status: INITIAL  4.  Pt's LEFS score witll improve by the MCID to 69% or greater as indication of improved function  Baseline: 54% Goal status: INITIAL  5.  Improve by MCID of 24ft as indication of improved functional mobility  Baseline: TBA Goal status: INITIAL  PLAN:  PT FREQUENCY: 2x/week  PT DURATION: 6 weeks  PLANNED INTERVENTIONS: 97164- PT Re-evaluation, 97110-Therapeutic exercises, 97530- Therapeutic activity, 97112- Neuromuscular re-education, 97535- Self Care, [id number]- Manual therapy, 97116- Gait training, Balance training, Stair training, Taping, Joint mobilization, Cryotherapy, and Moist heat  PLAN FOR NEXT SESSION: Assess ; assess response to HEP; progress therex as indicated; use of modalities, manual therapy; and TPDN as indicated.   Demaryius Imran MS, PT 06/17/24 7:29 AM

## 2024-06-18 ENCOUNTER — Ambulatory Visit

## 2024-06-23 ENCOUNTER — Telehealth: Payer: Self-pay | Admitting: Physical Therapy

## 2024-06-23 ENCOUNTER — Ambulatory Visit: Admitting: Physical Therapy

## 2024-06-23 NOTE — Telephone Encounter (Signed)
 Spoke to patient regarding missed appointment. She thought her appointment was for tomorrow. Offered reschedule. She opted to keep her next appointment on Thursday 06/25/24.

## 2024-06-25 ENCOUNTER — Ambulatory Visit

## 2024-07-08 ENCOUNTER — Other Ambulatory Visit: Payer: Self-pay | Admitting: Podiatry

## 2024-07-09 ENCOUNTER — Other Ambulatory Visit: Payer: Self-pay | Admitting: Medical Genetics

## 2024-07-13 ENCOUNTER — Encounter: Payer: Self-pay | Admitting: Podiatry

## 2024-07-13 ENCOUNTER — Ambulatory Visit (INDEPENDENT_AMBULATORY_CARE_PROVIDER_SITE_OTHER): Admitting: Podiatry

## 2024-07-13 DIAGNOSIS — M722 Plantar fascial fibromatosis: Secondary | ICD-10-CM | POA: Diagnosis not present

## 2024-07-13 MED ORDER — DEXAMETHASONE SODIUM PHOSPHATE 120 MG/30ML IJ SOLN
4.0000 mg | Freq: Once | INTRAMUSCULAR | Status: AC
  Administered 2024-07-13: 4 mg via INTRA_ARTICULAR

## 2024-07-13 MED ORDER — TRIAMCINOLONE ACETONIDE 10 MG/ML IJ SUSP
2.5000 mg | Freq: Once | INTRAMUSCULAR | Status: AC
  Administered 2024-07-13: 2.5 mg via INTRA_ARTICULAR

## 2024-07-13 NOTE — Progress Notes (Signed)
  Subjective:  Patient ID: Heather Riggs, female    DOB: September 03, 1991,   MRN: [id number]  Chief Complaint  Patient presents with   Plantar Fasciitis    It's okay but now it went to my other foot now.    33 y.o. female presents for follow-up of left plantar fasciiits. Relates it is doing ok but now getting pain int he right foot. Relates the right foot started to hurt recently and seems to be worse than the left. She denies much treamtnet for the right foot.  . Denies any other pedal complaints. Denies n/v/f/c.   Past Medical History:  Diagnosis Date   Headache    PIH (pregnancy induced hypertension)    With first pregnancy in 2018   Pregnancy induced hypertension    gHTN; no meds   Vaginal Pap smear, abnormal     Objective:  Physical Exam: Vascular: DP/PT pulses 2/4 bilateral. CFT <3 seconds. Normal hair growth on digits. No edema.  Skin. No lacerations or abrasions bilateral feet.  Musculoskeletal: MMT 5/5 bilateral lower extremities in DF, PF, Inversion and Eversion. Deceased ROM in DF of ankle joint. Tender to the medial calcaneal tubercle left and now pain on the right foot that has flared. . No pain with achilles, PT or arch. No pain with calcaneal squeeze.  Neurological: Sensation intact to light touch.   Assessment:   1. Plantar fasciitis of left foot   2. Plantar fasciitis, right        Plan:  Patient was evaluated and treated and all questions answered. Discussed plantar fasciitis with patient.  X-rays reviewed and discussed with patient. No acute fractures or dislocations noted. Mild spurring noted at inferior calcaneus.  Discussed treatment options including, ice, NSAIDS, supportive shoes, bracing, and stretching.  Continue stretching and brace.  Advised to start this on the right foot as well.  Patient requesting injection today. Procedure note below.   Follow-up 6 weeks or sooner if any problems arise. In the meantime, encouraged to call the  office with any questions, concerns, change in symptoms.   Procedure:  Discussed etiology, pathology, conservative vs. surgical therapies. At this time a plantar fascial injection was recommended.  The patient agreed and a sterile skin prep was applied.  An injection consisting of  1cc dexamethasone  0.5 cc kenalog  and 1cc marcaine  mixture was infiltrated at the point of maximal tenderness on the left Heel.  Bandaid applied. The patient tolerated this well and was given instructions for aftercare.    Asberry Failing, DPM

## 2024-07-13 NOTE — Patient Instructions (Signed)

## 2024-07-17 ENCOUNTER — Telehealth: Payer: Self-pay | Admitting: Pharmacist

## 2024-07-17 NOTE — Telephone Encounter (Signed)
 Pharmacy Patient Advocate Encounter  Received notification from [employer] that Prior Authorization for EMGALITY  120 MG/ML [place] SOAJ has been APPROVED from 07/17/2024 to 07/17/2025   PA #/Case ID/Reference #: [id number]

## 2024-07-17 NOTE — Telephone Encounter (Signed)
 Pharmacy Patient Advocate Encounter   Received notification from [employer]eds that prior authorization for Emgality  120MG /ML auto-injectors (migraine) is required/requested.   Insurance verification completed.   The patient is insured through [employer].   Per test claim: PA required; PA submitted to above mentioned insurance via Latent Key/confirmation #/EOC [id number] Status is pending

## 2024-08-21 ENCOUNTER — Other Ambulatory Visit (HOSPITAL_COMMUNITY): Payer: Self-pay

## 2024-08-28 ENCOUNTER — Other Ambulatory Visit: Payer: Self-pay | Admitting: Podiatry

## 2024-09-14 ENCOUNTER — Ambulatory Visit (INDEPENDENT_AMBULATORY_CARE_PROVIDER_SITE_OTHER): Admitting: Podiatry

## 2024-09-14 ENCOUNTER — Encounter: Payer: Self-pay | Admitting: Podiatry

## 2024-09-14 ENCOUNTER — Other Ambulatory Visit: Payer: Self-pay

## 2024-09-14 DIAGNOSIS — M722 Plantar fascial fibromatosis: Secondary | ICD-10-CM

## 2024-09-14 DIAGNOSIS — I8393 Asymptomatic varicose veins of bilateral lower extremities: Secondary | ICD-10-CM

## 2024-09-14 MED ORDER — CELECOXIB 100 MG PO CAPS: 100.0000 mg | ORAL_CAPSULE | Freq: Two times a day (BID) | ORAL | 0 refills | Status: AC

## 2024-09-14 NOTE — Progress Notes (Signed)
  Subjective:  Patient ID: Heather Riggs, female    DOB: September 15, 1991,   MRN: [id number]  Chief Complaint  Patient presents with   Plantar Fasciitis    It's okay.    33 y.o. female presents for follow-up of left plantar fasciiits. Relates it is doing ok but now getting pain int he right foot.  Has been in PT and this has helped some.  She relates the injection kind of helped.. Denies any other pedal complaints. Denies n/v/f/c.   Past Medical History:  Diagnosis Date   Headache    PIH (pregnancy induced hypertension)    With first pregnancy in 2018   Pregnancy induced hypertension    gHTN; no meds   Vaginal Pap smear, abnormal     Objective:  Physical Exam: Vascular: DP/PT pulses 2/4 bilateral. CFT <3 seconds. Normal hair growth on digits. No edema.  Skin. No lacerations or abrasions bilateral feet.  Musculoskeletal: MMT 5/5 bilateral lower extremities in DF, PF, Inversion and Eversion. Deceased ROM in DF of ankle joint. Tender to the medial calcaneal tubercle left and now pain on the right foot that has flared. . No pain with achilles, PT or arch. No pain with calcaneal squeeze.  Neurological: Sensation intact to light touch.   Assessment:   1. Plantar fasciitis of left foot   2. Plantar fasciitis, right        Plan:  Patient was evaluated and treated and all questions answered. Discussed plantar fasciitis with patient.  X-rays reviewed and discussed with patient. No acute fractures or dislocations noted. Mild spurring noted at inferior calcaneus.  Discussed treatment options including, ice, NSAIDS, supportive shoes, bracing, and stretching.  Continue stretching and brace.  Celebrex  sent to pharmacy.  Patient defers injection today.  Follow-up 6 weeks or sooner if any problems arise. In the meantime, encouraged to call the office with any questions, concerns, change in symptoms.    Asberry Failing, DPM

## 2024-09-30 ENCOUNTER — Other Ambulatory Visit (HOSPITAL_COMMUNITY)

## 2024-10-14 ENCOUNTER — Other Ambulatory Visit: Payer: Self-pay | Admitting: Medical Genetics

## 2024-10-14 DIAGNOSIS — Z006 Encounter for examination for normal comparison and control in clinical research program: Secondary | ICD-10-CM

## 2024-10-20 ENCOUNTER — Ambulatory Visit: Admitting: Physician Assistant

## 2024-10-20 ENCOUNTER — Ambulatory Visit (HOSPITAL_COMMUNITY)
Admission: RE | Admit: 2024-10-20 | Discharge: 2024-10-20 | Disposition: A | Discharge status: Home / Self Care | Source: Ambulatory Visit | Attending: Surgery | Admitting: Surgery

## 2024-10-20 VITALS — BP 106/71 | HR 82 | Temp 97.7°F | Wt 251.6 lb

## 2024-10-20 DIAGNOSIS — M7989 Other specified soft tissue disorders: Secondary | ICD-10-CM

## 2024-10-20 DIAGNOSIS — I8391 Asymptomatic varicose veins of right lower extremity: Secondary | ICD-10-CM

## 2024-10-20 DIAGNOSIS — I8393 Asymptomatic varicose veins of bilateral lower extremities: Secondary | ICD-10-CM | POA: Diagnosis not present

## 2024-10-20 NOTE — Progress Notes (Signed)
 "        [facility]           [facility] [place]  History and Physical   Heather Riggs is a 34 y.o. female who presents with BLE swelling with right worse than left with varicose veins.    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  She states that at the end of the day, her legs are swollen, tired, painful and achy and the right is worse than the left.  She states the swelling improves after being flat overnight.  She does wear knee high compression and this does help her legs feel better.  She does not have hx of DVT.  She has never had any venous procedures on her legs.  She does not have skin color changes.  She does not have family hx of varicose veins.  She does have hx of pregnancy and hx of tubal ligation.    The pt is not on a statin for cholesterol management.  The pt is not on a daily aspirin.   Other AC:  none The pt is not on medication for hypertension.   The pt is not on medication for diabetes.   Tobacco hx:  never  Pt does not have family hx of AAA.  Past Medical History:  Diagnosis Date   Headache    PIH (pregnancy induced hypertension)    With first pregnancy in 2018   Pregnancy induced hypertension    gHTN; no meds   Vaginal Pap smear, abnormal     Past Surgical History:  Procedure Laterality Date   BLADDER SURGERY     34 years old   DILATION AND EVACUATION N/A 07/21/2018   Procedure: SUCTION DILATATION AND EVACUATION;  Surgeon: Bettina Muskrat, MD;  Location: [facility];  Service: Gynecology;  Laterality: N/A;   TUBAL LIGATION Bilateral 12/24/2020   Procedure: POST PARTUM TUBAL LIGATION;  Surgeon: Bettina Muskrat, MD;  Location: [facility];  Service: Gynecology;  Laterality: Bilateral;    Social History   Socioeconomic History   Marital status: Married    Spouse name: Not on file   Number of children: Not on file   Years of education: Not on file   Highest education level: Not on file   Occupational History   Not on file  Tobacco Use   Smoking status: Never   Smokeless tobacco: Never  Vaping Use   Vaping status: Never Used  Substance and Sexual Activity   Alcohol use: Never   Drug use: Never   Sexual activity: Yes    Birth control/protection: None  Other Topics Concern   Not on file  Social History Narrative   ** Merged History Encounter          Pt lives with family    Pt works    Social Drivers of Health   Tobacco Use: Low Risk (10/20/2024)   Patient History    Smoking Tobacco Use: Never    Smokeless Tobacco Use: Never    Passive Exposure: Not on file  Financial Resource Strain: Not on file  Food Insecurity: Not on file  Transportation Needs: Not on file  Physical Activity: Not on file  Stress: Not on file (08/13/2023)  Social Connections: Not on file  Intimate Partner Violence: Not on file  Depression (EYV7-0): Not on file  Alcohol Screen: Not on file  Housing: Not on file  Utilities: Not on file  Health Literacy: Not on file    Family History  Problem Relation Age of Onset   Hypertension Father     Current Outpatient Medications  Medication Sig Dispense Refill   acetaminophen  (TYLENOL ) 500 MG tablet Take 1 tablet (500 mg total) by mouth every 6 (six) hours as needed. (Patient taking differently: Take 500 mg by mouth as needed for mild pain (pain score 1-3).) 30 tablet 0   Galcanezumab -gnlm (EMGALITY ) 120 MG/ML SOAJ first month dose inject two please fill this first. Every month thereafter inject once monthly(will be a separate prescription) 2 mL 0   Galcanezumab -gnlm (EMGALITY ) 120 MG/ML SOAJ Inject 120 mg into the skin every 30 (thirty) days. 1.12 mL 11   sertraline  (ZOLOFT ) 50 MG tablet Take 1 tablet (50 mg total) by mouth daily. 30 tablet 0   Ubrogepant  (UBRELVY ) 100 MG TABS Take 1 tablet (100 mg total) by mouth every 2 (two) hours as needed. Maximum 200mg  a day. 16 tablet 11   WEGOVY 1 MG/0.5ML SOAJ Inject 1 mg into the skin once a week.      No current facility-administered medications for this visit.    Allergies[1]  REVIEW OF SYSTEMS:   [X]  denotes positive finding, [ ]  denotes negative finding Cardiac  Comments:  Chest pain or chest pressure:    Shortness of breath upon exertion:    Short of breath when lying flat:    Irregular heart rhythm:        Vascular    Pain in calf, thigh, or hip brought on by ambulation:    Pain in feet at night that wakes you up from your sleep:     Blood clot in your veins:    Leg swelling:  x       Pulmonary    Oxygen at home:    Productive cough:     Wheezing:         Neurologic    Sudden weakness in arms or legs:     Sudden numbness in arms or legs:     Sudden onset of difficulty speaking or slurred speech:    Temporary loss of vision in one eye:     Problems with dizziness:         Gastrointestinal    Blood in stool:     Vomited blood:         Genitourinary    Burning when urinating:     Blood in urine:        Psychiatric    Major depression:         Hematologic    Bleeding problems:    Problems with blood clotting too easily:        Skin    Rashes or ulcers:        Constitutional    Fever or chills:      PHYSICAL EXAMINATION:  Today's Vitals   10/20/24 1028  BP: 106/71  Pulse: 82  Temp: 97.7 F (36.5 C)  TempSrc: Temporal  Weight: 251 lb 9.6 oz (114.1 kg)  PainSc: 0-No pain   Body mass index is 34.12 kg/m.   General:  WDWN in NAD; vital signs documented above Gait: Not observed HENT: WNL, normocephalic Pulmonary: normal non-labored breathing without wheezing Cardiac: regular HR; without carotid bruits Abdomen: soft, NT, aortic pulse is not palpable Skin: without rashes Vascular Exam/Pulses:  Right Left  Radial 2+ (normal) 2+ (normal)  DP 2+ (normal) 2+ (normal)   Extremities: mild bilateral lower extremity edema  Neurologic: A&O X 3;  moving all extremities equally Psychiatric:  The pt has Normal affect.   Non-Invasive  Vascular Imaging:   Venous duplex on 10/20/2024: Venous Reflux Times  +--------------+---------+------+-----------+------------+--------+  RIGHT        Reflux NoRefluxReflux TimeDiameter cmsComments                          Yes                                   +--------------+---------+------+-----------+------------+--------+  CFV                    yes   >1 second                       +--------------+---------+------+-----------+------------+--------+  FV mid        no                                              +--------------+---------+------+-----------+------------+--------+  Popliteal    no                                              +--------------+---------+------+-----------+------------+--------+  GSV at SFJ              yes    >500 ms      0.81              +--------------+---------+------+-----------+------------+--------+  GSV prox thigh          yes    >500 ms      0.83              +--------------+---------+------+-----------+------------+--------+  GSV mid thigh           yes    >500 ms      0.54              +--------------+---------+------+-----------+------------+--------+  GSV dist thigh          yes    >500 ms      0.55              +--------------+---------+------+-----------+------------+--------+  GSV at knee             yes    >500 ms      0.48              +--------------+---------+------+-----------+------------+--------+  GSV prox calf no                            0.36              +--------------+---------+------+-----------+------------+--------+  SSV at [facility]    no                            0.36              +--------------+---------+------+-----------+------------+--------+  SSV prox calf no                            0.25              +--------------+---------+------+-----------+------------+--------+  Summary:  Right:  - No evidence of deep vein  thrombosis seen in the right lower extremity, from the common femoral through the popliteal veins.  - No evidence of superficial venous thrombosis in the right lower extremity.  - No evidence of superficial venous reflux seen in the right short saphenous vein.  - Venous reflux is noted in the right common femoral vein.  - Venous reflux is noted in the right sapheno-femoral junction.  - Venous reflux is noted in the right greater saphenous vein in the thigh     Alahna Aaralyn Kil is a 34 y.o. female who presents with: BLE swelling and varicose veins with right worse than left    -pt has easily palpable DP pedal pulses bilaterally -in the right lower extremity, the pt does not have evidence of DVT.  Pt does have venous reflux in the deep CFV as well as the GSV at the [facility] and throughout the thigh and measures 0.48-0.83cm.   -discussed with pt about wearing thigh high 20-30 mmHg compression stockings and pt was measured for these today.    -discussed the importance of leg elevation and how to elevate properly - pt is advised to elevate their legs and a diagram is given to them to demonstrate for pt to lay flat on their back with knees elevated and slightly bent with their feet higher than their knees, which puts their feet higher than their heart for 15 minutes per day.  If pt cannot lay flat, advised to lay as flat as possible.  -pt is advised to continue as much walking as possible and avoid sitting or standing for long periods of time.  -discussed importance of maintaining a healthy weight and exercise and that water aerobics would also be beneficial.  -handout with recommendations given -pt will f/u in 3 months or so with vein MD and at that time, will get venous reflux duplex of LLE.     Lucie Apt, [facility] [phone]  Clinic MD:  Gretta    [1] No Known Allergies  "

## 2024-10-21 ENCOUNTER — Other Ambulatory Visit: Payer: Self-pay

## 2024-10-21 DIAGNOSIS — I8393 Asymptomatic varicose veins of bilateral lower extremities: Secondary | ICD-10-CM

## 2024-10-27 ENCOUNTER — Ambulatory Visit: Admitting: Podiatry

## 2024-10-27 ENCOUNTER — Encounter: Payer: Self-pay | Admitting: Podiatry

## 2024-10-27 DIAGNOSIS — M722 Plantar fascial fibromatosis: Secondary | ICD-10-CM | POA: Diagnosis not present

## 2024-10-27 NOTE — Progress Notes (Signed)
"  °  Subjective:  Patient ID: Heather Riggs, female    DOB: [birth year],   MRN: [id number]  Chief Complaint  Patient presents with   Plantar Fasciitis    It's okay.    34 y.o. female presents for follow-up of left plantar fasciiits.  Relates doing about the same with no improvement.  She relates she does not think the Celebrex  has helped much.  She does relates she has been stretching some.  She is ready to discuss next options.  Denies any other pedal complaints. Denies n/v/f/c.   Past Medical History:  Diagnosis Date   Headache    PIH (pregnancy induced hypertension)    With first pregnancy in 2018   Pregnancy induced hypertension    gHTN; no meds   Vaginal Pap smear, abnormal     Objective:  Physical Exam: Vascular: DP/PT pulses 2/4 bilateral. CFT <3 seconds. Normal hair growth on digits. No edema.  Skin. No lacerations or abrasions bilateral feet.  Musculoskeletal: MMT 5/5 bilateral lower extremities in DF, PF, Inversion and Eversion. Deceased ROM in DF of ankle joint. Tender to the medial calcaneal tubercle left and now pain on the right foot that has flared. . No pain with achilles, PT or arch. No pain with calcaneal squeeze.  Neurological: Sensation intact to light touch.   Assessment:   1. Plantar fasciitis of left foot   2. Plantar fasciitis, right         Plan:  Patient was evaluated and treated and all questions answered. Discussed plantar fasciitis with patient.  X-rays reviewed and discussed with patient. No acute fractures or dislocations noted. Mild spurring noted at inferior calcaneus.  Discussed treatment options including, ice, NSAIDS, supportive shoes, bracing, and stretching.  Continue stretching and brace.  Continue anti-inflammatories. Patient defers injection today. Discussed next options include PRP injection versus MRI and surgical intervention.  Patient would like to proceed with MRI. MRIs ordered bilateral feet. Follow-up after MRI     Asberry Failing, DPM    "

## 2024-11-03 ENCOUNTER — Telehealth: Admitting: Adult Health

## 2024-11-03 LAB — GENECONNECT MOLECULAR SCREEN: Genetic Analysis Overall Interpretation: NEGATIVE

## 2024-11-08 ENCOUNTER — Other Ambulatory Visit

## 2024-11-27 ENCOUNTER — Other Ambulatory Visit

## 2024-12-08 ENCOUNTER — Telehealth: Admitting: Adult Health

## 2025-01-20 ENCOUNTER — Ambulatory Visit (HOSPITAL_COMMUNITY)

## 2025-01-20 ENCOUNTER — Ambulatory Visit: Admitting: Vascular Surgery
